# Patient Record
Sex: Female | Born: 1965 | Race: Black or African American | Hispanic: No | Smoking: Current every day smoker
Health system: Southern US, Community
[De-identification: ages and names within clinical notes are randomized; demographics above are authoritative.]

## PROBLEM LIST (undated history)

## (undated) DIAGNOSIS — I10 Essential (primary) hypertension: Secondary | ICD-10-CM

## (undated) DIAGNOSIS — M25512 Pain in left shoulder: Secondary | ICD-10-CM

## (undated) DIAGNOSIS — D219 Benign neoplasm of connective and other soft tissue, unspecified: Secondary | ICD-10-CM

## (undated) DIAGNOSIS — D649 Anemia, unspecified: Secondary | ICD-10-CM

## (undated) DIAGNOSIS — J45909 Unspecified asthma, uncomplicated: Secondary | ICD-10-CM

## (undated) DIAGNOSIS — T7840XA Allergy, unspecified, initial encounter: Secondary | ICD-10-CM

## (undated) DIAGNOSIS — I639 Cerebral infarction, unspecified: Secondary | ICD-10-CM

## (undated) DIAGNOSIS — B20 Human immunodeficiency virus [HIV] disease: Secondary | ICD-10-CM

## (undated) DIAGNOSIS — F329 Major depressive disorder, single episode, unspecified: Secondary | ICD-10-CM

## (undated) DIAGNOSIS — F32A Depression, unspecified: Secondary | ICD-10-CM

## (undated) DIAGNOSIS — F209 Schizophrenia, unspecified: Secondary | ICD-10-CM

## (undated) DIAGNOSIS — G8929 Other chronic pain: Secondary | ICD-10-CM

## (undated) DIAGNOSIS — F419 Anxiety disorder, unspecified: Secondary | ICD-10-CM

## (undated) HISTORY — PX: WISDOM TOOTH EXTRACTION: SHX21

## (undated) HISTORY — DX: Allergy, unspecified, initial encounter: T78.40XA

## (undated) HISTORY — DX: Human immunodeficiency virus (HIV) disease: B20

## (undated) HISTORY — DX: Benign neoplasm of connective and other soft tissue, unspecified: D21.9

## (undated) HISTORY — DX: Cerebral infarction, unspecified: I63.9

## (undated) HISTORY — PX: APPENDECTOMY: SHX54

---

## 1999-04-24 ENCOUNTER — Emergency Department (HOSPITAL_COMMUNITY): Admission: EM | Admit: 1999-04-24 | Discharge: 1999-04-24 | Payer: Self-pay | Admitting: Emergency Medicine

## 1999-04-25 ENCOUNTER — Encounter: Payer: Self-pay | Admitting: Otolaryngology

## 1999-04-25 ENCOUNTER — Inpatient Hospital Stay (HOSPITAL_COMMUNITY): Admission: RE | Admit: 1999-04-25 | Discharge: 1999-04-26 | Payer: Self-pay | Admitting: Otolaryngology

## 1999-09-09 ENCOUNTER — Emergency Department (HOSPITAL_COMMUNITY): Admission: EM | Admit: 1999-09-09 | Discharge: 1999-09-09 | Payer: Self-pay | Admitting: Emergency Medicine

## 1999-09-10 ENCOUNTER — Emergency Department (HOSPITAL_COMMUNITY): Admission: EM | Admit: 1999-09-10 | Discharge: 1999-09-10 | Payer: Self-pay | Admitting: Emergency Medicine

## 1999-09-10 ENCOUNTER — Encounter: Payer: Self-pay | Admitting: Emergency Medicine

## 2000-04-27 ENCOUNTER — Inpatient Hospital Stay (HOSPITAL_COMMUNITY): Admission: AD | Admit: 2000-04-27 | Discharge: 2000-04-27 | Payer: Self-pay | Admitting: *Deleted

## 2001-11-03 ENCOUNTER — Emergency Department (HOSPITAL_COMMUNITY): Admission: EM | Admit: 2001-11-03 | Discharge: 2001-11-04 | Payer: Self-pay | Admitting: Emergency Medicine

## 2001-11-03 ENCOUNTER — Emergency Department (HOSPITAL_COMMUNITY): Admission: EM | Admit: 2001-11-03 | Discharge: 2001-11-03 | Payer: Self-pay | Admitting: Emergency Medicine

## 2002-06-23 ENCOUNTER — Emergency Department (HOSPITAL_COMMUNITY): Admission: EM | Admit: 2002-06-23 | Discharge: 2002-06-23 | Payer: Self-pay | Admitting: *Deleted

## 2002-08-23 ENCOUNTER — Emergency Department (HOSPITAL_COMMUNITY): Admission: EM | Admit: 2002-08-23 | Discharge: 2002-08-23 | Payer: Self-pay

## 2002-11-20 ENCOUNTER — Encounter: Payer: Self-pay | Admitting: *Deleted

## 2002-11-20 ENCOUNTER — Emergency Department (HOSPITAL_COMMUNITY): Admission: EM | Admit: 2002-11-20 | Discharge: 2002-11-20 | Payer: Self-pay | Admitting: *Deleted

## 2003-10-31 ENCOUNTER — Emergency Department (HOSPITAL_COMMUNITY): Admission: EM | Admit: 2003-10-31 | Discharge: 2003-10-31 | Payer: Self-pay | Admitting: Emergency Medicine

## 2005-03-16 ENCOUNTER — Encounter: Payer: Self-pay | Admitting: Emergency Medicine

## 2005-03-16 ENCOUNTER — Emergency Department (HOSPITAL_COMMUNITY): Admission: EM | Admit: 2005-03-16 | Discharge: 2005-03-16 | Payer: Self-pay | Admitting: Emergency Medicine

## 2009-10-15 ENCOUNTER — Emergency Department (HOSPITAL_COMMUNITY): Admission: EM | Admit: 2009-10-15 | Discharge: 2009-10-15 | Payer: Self-pay | Admitting: Emergency Medicine

## 2009-10-30 ENCOUNTER — Emergency Department (HOSPITAL_COMMUNITY): Admission: EM | Admit: 2009-10-30 | Discharge: 2009-10-30 | Payer: Self-pay | Admitting: Emergency Medicine

## 2009-11-01 ENCOUNTER — Emergency Department (HOSPITAL_COMMUNITY): Admission: EM | Admit: 2009-11-01 | Discharge: 2009-11-01 | Payer: Self-pay | Admitting: Emergency Medicine

## 2009-11-13 ENCOUNTER — Emergency Department (HOSPITAL_COMMUNITY): Admission: EM | Admit: 2009-11-13 | Discharge: 2009-11-13 | Payer: Self-pay | Admitting: Emergency Medicine

## 2011-01-21 ENCOUNTER — Emergency Department (HOSPITAL_COMMUNITY)
Admission: EM | Admit: 2011-01-21 | Discharge: 2011-01-21 | Disposition: A | Discharge status: Home / Self Care | Payer: Self-pay | Attending: Emergency Medicine | Admitting: Emergency Medicine

## 2011-01-21 DIAGNOSIS — J029 Acute pharyngitis, unspecified: Secondary | ICD-10-CM | POA: Insufficient documentation

## 2011-03-21 LAB — RAPID STREP SCREEN (MED CTR MEBANE ONLY): Streptococcus, Group A Screen (Direct): NEGATIVE

## 2011-03-21 LAB — CBC
HCT: 33.8 % — ABNORMAL LOW (ref 36.0–46.0)
Hemoglobin: 11 g/dL — ABNORMAL LOW (ref 12.0–15.0)
MCHC: 32.6 g/dL (ref 30.0–36.0)
MCV: 76.1 fL — ABNORMAL LOW (ref 78.0–100.0)
Platelets: 283 10*3/uL (ref 150–400)
RBC: 4.44 MIL/uL (ref 3.87–5.11)
RDW: 19.5 % — ABNORMAL HIGH (ref 11.5–15.5)
WBC: 5.8 K/uL (ref 4.0–10.5)

## 2011-03-21 LAB — URINALYSIS, ROUTINE W REFLEX MICROSCOPIC
Bilirubin Urine: NEGATIVE
Glucose, UA: NEGATIVE mg/dL
Ketones, ur: NEGATIVE mg/dL
Protein, ur: 30 mg/dL — AB
Specific Gravity, Urine: 1.016 (ref 1.005–1.030)
Urobilinogen, UA: 1 mg/dL (ref 0.0–1.0)
pH: 7 (ref 5.0–8.0)

## 2011-03-21 LAB — COMPREHENSIVE METABOLIC PANEL WITH GFR
Alkaline Phosphatase: 82 U/L (ref 39–117)
BUN: 5 mg/dL — ABNORMAL LOW (ref 6–23)
CO2: 26 meq/L (ref 19–32)
Chloride: 88 meq/L — ABNORMAL LOW (ref 96–112)
Creatinine, Ser: 0.78 mg/dL (ref 0.4–1.2)
GFR calc non Af Amer: >60 mL/min (ref >60)
Glucose, Bld: 107 mg/dL — ABNORMAL HIGH (ref 70–99)
Potassium: 3.4 meq/L — ABNORMAL LOW (ref 3.5–5.1)
Total Bilirubin: 0.4 mg/dL (ref 0.3–1.2)

## 2011-03-21 LAB — DIFFERENTIAL
Basophils Absolute: 0 K/uL (ref 0.0–0.1)
Basophils Relative: 1 % (ref 0–1)
Eosinophils Absolute: 0 10*3/uL (ref 0.0–0.7)
Eosinophils Relative: 0 % (ref 0–5)
Lymphocytes Relative: 22 % (ref 12–46)
Lymphs Abs: 1.3 10*3/uL (ref 0.7–4.0)
Monocytes Absolute: 0.6 K/uL (ref 0.1–1.0)
Monocytes Relative: 10 % (ref 3–12)
Neutro Abs: 3.9 K/uL (ref 1.7–7.7)
Neutrophils Relative %: 68 % (ref 43–77)

## 2011-03-21 LAB — URINE MICROSCOPIC-ADD ON

## 2011-03-21 LAB — COMPREHENSIVE METABOLIC PANEL
ALT: 49 U/L — ABNORMAL HIGH (ref 0–35)
AST: 62 U/L — ABNORMAL HIGH (ref 0–37)
Albumin: 3.1 g/dL — ABNORMAL LOW (ref 3.5–5.2)
Calcium: 7.8 mg/dL — ABNORMAL LOW (ref 8.4–10.5)
GFR calc Af Amer: >60 mL/min (ref >60)
Sodium: 125 mEq/L — ABNORMAL LOW (ref 135–145)
Total Protein: 7.1 g/dL (ref 6.0–8.3)

## 2011-03-21 LAB — STREP A DNA PROBE: Group A Strep Probe: NEGATIVE

## 2011-03-21 LAB — MONONUCLEOSIS SCREEN: Mono Screen: POSITIVE — AB

## 2011-06-21 ENCOUNTER — Emergency Department (HOSPITAL_COMMUNITY): Payer: Self-pay

## 2011-06-21 ENCOUNTER — Emergency Department (HOSPITAL_COMMUNITY)
Admission: EM | Admit: 2011-06-21 | Discharge: 2011-06-21 | Disposition: A | Discharge status: Home / Self Care | Payer: Self-pay | Attending: Emergency Medicine | Admitting: Emergency Medicine

## 2011-06-21 DIAGNOSIS — R296 Repeated falls: Secondary | ICD-10-CM | POA: Insufficient documentation

## 2011-06-21 DIAGNOSIS — S43016A Anterior dislocation of unspecified humerus, initial encounter: Secondary | ICD-10-CM | POA: Insufficient documentation

## 2011-06-21 DIAGNOSIS — M25519 Pain in unspecified shoulder: Secondary | ICD-10-CM | POA: Insufficient documentation

## 2011-10-27 ENCOUNTER — Encounter: Payer: Self-pay | Admitting: *Deleted

## 2011-10-27 ENCOUNTER — Emergency Department (INDEPENDENT_AMBULATORY_CARE_PROVIDER_SITE_OTHER)
Admission: EM | Admit: 2011-10-27 | Discharge: 2011-10-27 | Disposition: A | Discharge status: Home / Self Care | Payer: Self-pay | Source: Home / Self Care | Attending: Emergency Medicine | Admitting: Emergency Medicine

## 2011-10-27 DIAGNOSIS — R591 Generalized enlarged lymph nodes: Secondary | ICD-10-CM

## 2011-10-27 DIAGNOSIS — H9209 Otalgia, unspecified ear: Secondary | ICD-10-CM

## 2011-10-27 DIAGNOSIS — H9201 Otalgia, right ear: Secondary | ICD-10-CM

## 2011-10-27 DIAGNOSIS — R599 Enlarged lymph nodes, unspecified: Secondary | ICD-10-CM

## 2011-10-27 MED ORDER — NEOMYCIN-POLYMYXIN-HC 1 % OT SOLN: 3.0000 [drp] | Freq: Four times a day (QID) | OTIC | Status: AC

## 2011-10-27 NOTE — ED Notes (Signed)
C/O right earache, left ear "tingling", throat irritation since yesterday.  Pt believes she may have piece of cotton from Q-tip lodged in ear - no FB noted.

## 2011-10-27 NOTE — ED Provider Notes (Signed)
History     CSN: [id number] Arrival date & time: 10/27/2011  6:34 PM   First MD Initiated Contact with Patient 10/27/11 1903      Chief Complaint  Patient presents with  . Otalgia    Throat Irritation    (Consider location/radiation/quality/duration/timing/severity/associated sxs/prior treatment) HPI  History reviewed. No pertinent past medical history.  Past Surgical History  Procedure Date  . Appendectomy     No family history on file.  History  Substance Use Topics  . Smoking status: Not on file  . Smokeless tobacco: Not on file  . Alcohol Use: No    OB History    Grav Para Term Preterm Abortions TAB SAB Ect Mult Living                  Review of Systems  Allergies  Penicillins  Home Medications  No current outpatient prescriptions on file.  BP 152/99  Pulse 80  Temp(Src) 98.4 F (36.9 C) (Oral)  Resp 12  SpO2 99%  LMP 10/25/2011  Physical Exam  ED Course  Procedures (including critical care time)  Labs Reviewed - No data to display No results found.   No diagnosis found.    MDM  Left ear pain NO FB with sentinel or reactive LAD        Jimmie Molly, MD 10/27/11 1910

## 2011-12-18 DIAGNOSIS — G8929 Other chronic pain: Secondary | ICD-10-CM

## 2011-12-18 HISTORY — DX: Other chronic pain: G89.29

## 2012-12-29 ENCOUNTER — Encounter (HOSPITAL_COMMUNITY): Payer: Self-pay | Admitting: Emergency Medicine

## 2012-12-29 ENCOUNTER — Emergency Department (HOSPITAL_COMMUNITY): Payer: Self-pay

## 2012-12-29 ENCOUNTER — Emergency Department (HOSPITAL_COMMUNITY)
Admission: EM | Admit: 2012-12-29 | Discharge: 2012-12-30 | Disposition: A | Discharge status: Home / Self Care | Payer: Self-pay | Attending: Emergency Medicine | Admitting: Emergency Medicine

## 2012-12-29 DIAGNOSIS — R42 Dizziness and giddiness: Secondary | ICD-10-CM | POA: Insufficient documentation

## 2012-12-29 DIAGNOSIS — B9789 Other viral agents as the cause of diseases classified elsewhere: Secondary | ICD-10-CM | POA: Insufficient documentation

## 2012-12-29 DIAGNOSIS — N949 Unspecified condition associated with female genital organs and menstrual cycle: Secondary | ICD-10-CM | POA: Insufficient documentation

## 2012-12-29 DIAGNOSIS — N938 Other specified abnormal uterine and vaginal bleeding: Secondary | ICD-10-CM | POA: Insufficient documentation

## 2012-12-29 DIAGNOSIS — R509 Fever, unspecified: Secondary | ICD-10-CM | POA: Insufficient documentation

## 2012-12-29 DIAGNOSIS — D259 Leiomyoma of uterus, unspecified: Secondary | ICD-10-CM | POA: Insufficient documentation

## 2012-12-29 DIAGNOSIS — R109 Unspecified abdominal pain: Secondary | ICD-10-CM | POA: Insufficient documentation

## 2012-12-29 DIAGNOSIS — D219 Benign neoplasm of connective and other soft tissue, unspecified: Secondary | ICD-10-CM

## 2012-12-29 DIAGNOSIS — B349 Viral infection, unspecified: Secondary | ICD-10-CM

## 2012-12-29 DIAGNOSIS — N939 Abnormal uterine and vaginal bleeding, unspecified: Secondary | ICD-10-CM

## 2012-12-29 LAB — URINALYSIS, ROUTINE W REFLEX MICROSCOPIC
Bilirubin Urine: NEGATIVE
Specific Gravity, Urine: 1.016 (ref 1.005–1.030)
Urobilinogen, UA: 0.2 mg/dL (ref 0.0–1.0)
pH: 6 (ref 5.0–8.0)

## 2012-12-29 LAB — CBC WITH DIFFERENTIAL/PLATELET
Basophils Absolute: 0 10*3/uL (ref 0.0–0.1)
Basophils Relative: 0 % (ref 0–1)
Hemoglobin: 8.2 g/dL — ABNORMAL LOW (ref 12.0–15.0)
Lymphocytes Relative: 57 % — ABNORMAL HIGH (ref 12–46)
Lymphs Abs: 3.3 10*3/uL (ref 0.7–4.0)
MCHC: 30.4 g/dL (ref 30.0–36.0)
Myelocytes: 0 %
Neutro Abs: 2.2 10*3/uL (ref 1.7–7.7)
Neutrophils Relative %: 38 % — ABNORMAL LOW (ref 43–77)
Platelets: 349 10*3/uL (ref 150–400)
Promyelocytes Absolute: 0 %
RBC: 3.82 MIL/uL — ABNORMAL LOW (ref 3.87–5.11)
nRBC: 0 /100 WBC

## 2012-12-29 LAB — URINE MICROSCOPIC-ADD ON

## 2012-12-29 LAB — COMPREHENSIVE METABOLIC PANEL
Albumin: 3.3 g/dL — ABNORMAL LOW (ref 3.5–5.2)
Alkaline Phosphatase: 93 U/L (ref 39–117)
BUN: 14 mg/dL (ref 6–23)
CO2: 23 mEq/L (ref 19–32)
Chloride: 101 mEq/L (ref 96–112)
Creatinine, Ser: 0.81 mg/dL (ref 0.50–1.10)
GFR calc Af Amer: >90 mL/min (ref >90)
GFR calc non Af Amer: 86 mL/min — ABNORMAL LOW (ref >90)
Glucose, Bld: 91 mg/dL (ref 70–99)
Potassium: 3.8 mEq/L (ref 3.5–5.1)
Total Bilirubin: 0.2 mg/dL — ABNORMAL LOW (ref 0.3–1.2)

## 2012-12-29 LAB — PREGNANCY, URINE: Preg Test, Ur: NEGATIVE

## 2012-12-29 LAB — PROTIME-INR
INR: 1.07 (ref 0.00–1.49)
Prothrombin Time: 13.8 seconds (ref 11.6–15.2)

## 2012-12-29 MED ORDER — MEDROXYPROGESTERONE ACETATE 5 MG PO TABS: 20.0000 mg | ORAL_TABLET | Freq: Every day | ORAL | Status: DC

## 2012-12-29 MED ORDER — SODIUM CHLORIDE 0.9 % IV BOLUS (SEPSIS)
1000.0000 mL | Freq: Once | INTRAVENOUS | Status: AC
  Administered 2012-12-29: 1000 mL via INTRAVENOUS

## 2012-12-29 NOTE — ED Notes (Signed)
Pt reports cough, sore throat and fever since Friday. Denies chills or body aches. Fever at home 101. Also report that she has vaginal bleeding x1 month due fibroids and says "I feel weak from all this bleeding."

## 2012-12-29 NOTE — ED Notes (Signed)
MD at bedside. Performing pelvic exam

## 2012-12-29 NOTE — ED Notes (Signed)
Urine recollected 

## 2012-12-29 NOTE — ED Provider Notes (Addendum)
History     CSN: [id number]  Arrival date & time 12/29/12  1600   First MD Initiated Contact with Patient 12/29/12 1927      Chief Complaint  Patient presents with  . Vaginal Bleeding  . flu like symptoms     (Consider location/radiation/quality/duration/timing/severity/associated sxs/prior treatment) The history is provided by the patient.  Katie Doyle is a 47 y.o. female history of fibroids here with vaginal bleeding. Vaginal bleeding intermittent for the whole month. Since yesterday she said that she bleed about 20 pads. She feels lightheaded and dizzy. As diffuse crampy pain from the fibroids. No vomiting. For the last 5 days she's been having some cough and sore throat and subjective fevers. She thought she had a viral infection.   History reviewed. No pertinent past medical history.  Past Surgical History  Procedure Date  . Appendectomy     No family history on file.  History  Substance Use Topics  . Smoking status: Not on file  . Smokeless tobacco: Not on file  . Alcohol Use: No    OB History    Grav Para Term Preterm Abortions TAB SAB Ect Mult Living                  Review of Systems  Constitutional: Positive for fever.  Gastrointestinal: Positive for abdominal pain.  Genitourinary: Positive for vaginal bleeding.  All other systems reviewed and are negative.    Allergies  Penicillins  Home Medications   Current Outpatient Rx  Name  Route  Sig  Dispense  Refill  . MUCINEX DM PO   Oral   Take 20 mLs by mouth 2 (two) times daily as needed. For cough.           BP 168/97  Pulse 80  Temp 98.7 F (37.1 C)  Resp 20  SpO2 100%  Physical Exam  Nursing note and vitals reviewed. Constitutional: She is oriented to person, place, and time. She appears well-developed.       Uncomfortable   HENT:  Head: Normocephalic.  Mouth/Throat: Oropharynx is clear and moist.  Eyes: Pupils are equal, round, and reactive to light.       Conjunctiva  slightly pale   Neck: Normal range of motion. Neck supple.  Cardiovascular: Normal rate, regular rhythm and normal heart sounds.   Pulmonary/Chest: Effort normal and breath sounds normal. No respiratory distress. She has no wheezes. She has no rales.  Abdominal:       Firm, large fibroids up to epigastric area, + tender fibroids.   Genitourinary:       Minimal vag bleeding. No active bleeding. + uterine tenderness from large fibroids. Unable to feel adnexa.   Musculoskeletal: Normal range of motion.  Neurological: She is alert and oriented to person, place, and time.  Skin: Skin is warm and dry.  Psychiatric: She has a normal mood and affect. Her behavior is normal. Judgment and thought content normal.    ED Course  Procedures (including critical care time)  Labs Reviewed  CBC WITH DIFFERENTIAL - Abnormal; Notable for the following:    RBC 3.82 (*)     Hemoglobin 8.2 (*)     HCT 27.0 (*)     MCV 70.7 (*)     MCH 21.5 (*)     RDW 18.1 (*)     Neutrophils Relative 38 (*)     Lymphocytes Relative 57 (*)     Monocytes Relative 2 (*)     All  other components within normal limits  COMPREHENSIVE METABOLIC PANEL - Abnormal; Notable for the following:    Albumin 3.3 (*)     Total Bilirubin 0.2 (*)     GFR calc non Af Amer 86 (*)     All other components within normal limits  URINALYSIS, ROUTINE W REFLEX MICROSCOPIC - Abnormal; Notable for the following:    Hgb urine dipstick LARGE (*)     All other components within normal limits  URINE MICROSCOPIC-ADD ON - Abnormal; Notable for the following:    Squamous Epithelial / LPF FEW (*)     All other components within normal limits  PROTIME-INR  URINALYSIS, ROUTINE W REFLEX MICROSCOPIC  PREGNANCY, URINE  PREGNANCY, URINE   US Transvaginal Non-ob  12/29/2012  *RADIOLOGY REPORT*  Clinical Data: D U B.  History of fibroids.  Concern for degenerating fibroid versus ovarian torsion.  TRANSABDOMINAL AND TRANSVAGINAL ULTRASOUND OF PELVIS  Technique:  Both transabdominal and transvaginal ultrasound examinations of the pelvis were performed. Transabdominal technique was performed for global imaging of the pelvis including uterus, ovaries, adnexal regions, and pelvic cul-de-sac.  It was necessary to proceed with endovaginal exam following the transabdominal exam to visualize the ovaries and endometrium.  Comparison:  None  Findings:  Uterus: There is diffuse multinodular enlargement of the uterus. Uterus measures 24 x 12.8 x 21.3 cm.  Multiple uterine masses are demonstrated consistent with fibroids, including submucosal, subserosal, and exophytic fibroids.  The largest lesions represent an exophytic fibroid arising from the dome of the uterus measuring 8.1 x 7.3 x 7.7 cm.  A right posterior fibroid measures 8.6 x 8.2 x 9 cm.  A left anterior fibroid measures 6.1 x 5.6 x 7 cm and contains calcification.  Right mid submucosal fibroid measures 9.1 x 9.7 x 10.5 cm. No definite cystic degeneration is demonstrated.  Endometrium: The endometrium is not visualized due to displacement by the multiple fibroids.  Right ovary:  Right ovary is not visualized.  No abnormal masses are demonstrated in the visualized adnexal region.  Left ovary: Left ovary is not visualized.  No abnormal masses are demonstrated in the visualized adnexal region.  Other findings: No free fluid  IMPRESSION: Multiple uterine fibroids resulting in diffuse nodular enlargement of the uterus.  The ovaries and endometrium are not visualized.   Original Report Authenticated By: Burman Nieves, M.D.    US Pelvis Complete  12/29/2012  *RADIOLOGY REPORT*  Clinical Data: D U B.  History of fibroids.  Concern for degenerating fibroid versus ovarian torsion.  TRANSABDOMINAL AND TRANSVAGINAL ULTRASOUND OF PELVIS Technique:  Both transabdominal and transvaginal ultrasound examinations of the pelvis were performed. Transabdominal technique was performed for global imaging of the pelvis including  uterus, ovaries, adnexal regions, and pelvic cul-de-sac.  It was necessary to proceed with endovaginal exam following the transabdominal exam to visualize the ovaries and endometrium.  Comparison:  None  Findings:  Uterus: There is diffuse multinodular enlargement of the uterus. Uterus measures 24 x 12.8 x 21.3 cm.  Multiple uterine masses are demonstrated consistent with fibroids, including submucosal, subserosal, and exophytic fibroids.  The largest lesions represent an exophytic fibroid arising from the dome of the uterus measuring 8.1 x 7.3 x 7.7 cm.  A right posterior fibroid measures 8.6 x 8.2 x 9 cm.  A left anterior fibroid measures 6.1 x 5.6 x 7 cm and contains calcification.  Right mid submucosal fibroid measures 9.1 x 9.7 x 10.5 cm. No definite cystic degeneration is demonstrated.  Endometrium: The  endometrium is not visualized due to displacement by the multiple fibroids.  Right ovary:  Right ovary is not visualized.  No abnormal masses are demonstrated in the visualized adnexal region.  Left ovary: Left ovary is not visualized.  No abnormal masses are demonstrated in the visualized adnexal region.  Other findings: No free fluid  IMPRESSION: Multiple uterine fibroids resulting in diffuse nodular enlargement of the uterus.  The ovaries and endometrium are not visualized.   Original Report Authenticated By: Burman Nieves, M.D.      No diagnosis found.    MDM  Katie Doyle is a 47 y.o. female here with ab pain, vag bleeding. Hg 8.2, no baseline. Will do US transvag to r/o degenerating fibroids given abdominal tenderness. Patient not orthostatic.   11:30 PM US showed multiple fibroids but no degenerative fibroids. Patient comfortable. I called on call OB doctor, who recommend starting Provera 20mg  daily and will call her to follow up at [facility] clinic. Return precautions given.        Richardean Canal, MD 12/29/12 2330  Richardean Canal, MD 12/29/12 [phone]

## 2012-12-29 NOTE — ED Notes (Signed)
Pelvic cart ready. 

## 2012-12-29 NOTE — ED Notes (Signed)
US at bedside

## 2013-01-14 ENCOUNTER — Encounter: Payer: Self-pay | Admitting: Obstetrics & Gynecology

## 2013-01-19 ENCOUNTER — Ambulatory Visit (INDEPENDENT_AMBULATORY_CARE_PROVIDER_SITE_OTHER): Payer: Self-pay | Admitting: Obstetrics & Gynecology

## 2013-01-19 ENCOUNTER — Other Ambulatory Visit (HOSPITAL_COMMUNITY)
Admission: RE | Admit: 2013-01-19 | Discharge: 2013-01-19 | Disposition: A | Discharge status: Home / Self Care | Payer: Self-pay | Source: Ambulatory Visit | Attending: Obstetrics & Gynecology | Admitting: Obstetrics & Gynecology

## 2013-01-19 ENCOUNTER — Encounter: Payer: Self-pay | Admitting: Obstetrics & Gynecology

## 2013-01-19 VITALS — BP 162/91 | HR 75 | Ht 65.5 in | Wt 219.9 lb

## 2013-01-19 DIAGNOSIS — D259 Leiomyoma of uterus, unspecified: Secondary | ICD-10-CM | POA: Insufficient documentation

## 2013-01-19 DIAGNOSIS — D649 Anemia, unspecified: Secondary | ICD-10-CM | POA: Insufficient documentation

## 2013-01-19 DIAGNOSIS — N920 Excessive and frequent menstruation with regular cycle: Secondary | ICD-10-CM | POA: Insufficient documentation

## 2013-01-19 MED ORDER — MEDROXYPROGESTERONE ACETATE 10 MG PO TABS: 10.0000 mg | ORAL_TABLET | Freq: Every day | ORAL | Status: DC

## 2013-01-19 NOTE — Progress Notes (Signed)
Patient ID: Katie Doyle, female   DOB: Nov 16, 1966, 47 y.o.   MRN: [id number]  Chief Complaint  Patient presents with  . Menorrhagia    on Provera spotting now. Before medicine she was bleeding for about 3 months straight.   . Fibroids    HPI Katie Doyle is a 47 y.o. female.  Long h/o fibroid uterus and increased menrrhagia, seen in [facility] ED.Less bleeding on Provera currently HPI  Past Medical History  Diagnosis Date  . Fibroids   . Allergy     Past Surgical History  Procedure Date  . Appendectomy   . Cesarean section     History reviewed. No pertinent family history.  Social History History  Substance Use Topics  . Smoking status: Light Tobacco Smoker -- 0.2 packs/day    Types: Cigarettes  . Smokeless tobacco: Never Used  . Alcohol Use: No    Allergies  Allergen Reactions  . Penicillins Itching    Current Outpatient Prescriptions  Medication Sig Dispense Refill  . medroxyPROGESTERone (PROVERA) 10 MG tablet Take 1 tablet (10 mg total) by mouth daily.  30 tablet  2  . Dextromethorphan-Guaifenesin (MUCINEX DM PO) Take 20 mLs by mouth 2 (two) times daily as needed. For cough.        Review of Systems Review of Systems  Gastrointestinal: Positive for abdominal pain and abdominal distention. Negative for nausea and vomiting.  Genitourinary: Positive for urgency, frequency and vaginal bleeding. Negative for vaginal discharge and vaginal pain.    Blood pressure 162/91, pulse 75, height 5' 5.5" (1.664 m), weight 219 lb 14.4 oz (99.746 kg), last menstrual period 01/18/2013.  Physical Exam Physical Exam  Constitutional: She appears well-developed and well-nourished. No distress.  Pulmonary/Chest: Effort normal. No respiratory distress.  Abdominal: She exhibits distension and mass (firm mass to RUQ c/w large fibroid uterus). There is no tenderness.  Genitourinary:       Dark vaginal bleeding, cervix no lesions pap done, cervix prepped and EMB done after counseling and  time out, sounds > 12 cm, dark bloody specimen  Skin: Skin is warm.  Psychiatric: She has a normal mood and affect. Her behavior is normal.    Data Reviewed *RADIOLOGY REPORT*  Clinical Data: D U B. History of fibroids. Concern for  degenerating fibroid versus ovarian torsion.  TRANSABDOMINAL AND TRANSVAGINAL ULTRASOUND OF PELVIS  Technique: Both transabdominal and transvaginal ultrasound  examinations of the pelvis were performed. Transabdominal technique  was performed for global imaging of the pelvis including uterus,  ovaries, adnexal regions, and pelvic cul-de-sac.  It was necessary to proceed with endovaginal exam following the  transabdominal exam to visualize the ovaries and endometrium.  Comparison: None  Findings:  Uterus: There is diffuse multinodular enlargement of the uterus.  Uterus measures 24 x 12.8 x 21.3 cm. Multiple uterine masses are  demonstrated consistent with fibroids, including submucosal,  subserosal, and exophytic fibroids.  The largest lesions represent an exophytic fibroid arising from the  dome of the uterus measuring 8.1 x 7.3 x 7.7 cm. A right posterior  fibroid measures 8.6 x 8.2 x 9 cm. A left anterior fibroid  measures 6.1 x 5.6 x 7 cm and contains calcification. Right mid  submucosal fibroid measures 9.1 x 9.7 x 10.5 cm. No definite cystic  degeneration is demonstrated.  Endometrium: The endometrium is not visualized due to displacement  by the multiple fibroids.  Right ovary: Right ovary is not visualized. No abnormal masses  are demonstrated in the visualized adnexal region.  Left ovary: Left ovary is not visualized. No abnormal masses are  demonstrated in the visualized adnexal region.  Other findings: No free fluid  IMPRESSION:  Multiple uterine fibroids resulting in diffuse nodular enlargement  of the uterus. The ovaries and endometrium are not visualized.  Original Report Authenticated By: Burman Nieves, M.D. CBC    Component Value  Date/Time   WBC 5.8 12/29/2012 1927   RBC 3.82* 12/29/2012 1927   HGB 8.2* 12/29/2012 1927   HCT 27.0* 12/29/2012 1927   PLT 349 12/29/2012 1927   MCV 70.7* 12/29/2012 1927   MCH 21.5* 12/29/2012 1927   MCHC 30.4 12/29/2012 1927   RDW 18.1* 12/29/2012 1927   LYMPHSABS 3.3 12/29/2012 1927   MONOABS 0.1 12/29/2012 1927   EOSABS 0.2 12/29/2012 1927   BASOSABS 0.0 12/29/2012 1927      Assessment    Menorrhagia large fibroids and anemia    Plan    Needs TAH, procedure and risk s discussed, apply for financial assistance and order Lupron Depot 11.25 mg asap, continue Provera, RTC 4 weeks, review Bx       Christabel Camire 01/19/2013, 3:04 PM

## 2013-01-19 NOTE — Patient Instructions (Addendum)
Hysterectomy Information   A hysterectomy is a procedure where your uterus is surgically removed. It will no longer be possible to have menstrual periods or to become pregnant. The tubes and ovaries can be removed (bilateral salpingo-oopherectomy) during this surgery as well.    REASONS FOR A HYSTERECTOMY  · Persistent, abnormal bleeding.  · Lasting (chronic) pelvic pain or infection.  · The lining of the uterus (endometrium) starts growing outside the uterus (endometriosis).  · The endometrium starts growing in the muscle of the uterus (adenomyosis).  · The uterus falls down into the vagina (pelvic organ prolapse).  · Symptomatic uterine fibroids.  · Precancerous cells.  · Cervical cancer or uterine cancer.  TYPES OF HYSTERECTOMIES  · Supracervical hysterectomy. This type removes the top part of the uterus, but not the cervix.  · Total hysterectomy. This type removes the uterus and cervix.  · Radical hysterectomy. This type removes the uterus, cervix, and the fibrous tissue that holds the uterus in place in the pelvis (parametrium).  WAYS A HYSTERECTOMY CAN BE PERFORMED  · Abdominal hysterectomy. A large surgical cut (incision) is made in the abdomen. The uterus is removed through this incision.  · Vaginal hysterectomy. An incision is made in the vagina. The uterus is removed through this incision. There are no abdominal incisions.  · Conventional laparoscopic hysterectomy. A thin, lighted tube with a camera (laparoscope) is inserted into 3 or 4 small incisions in the abdomen. The uterus is cut into small pieces. The small pieces are removed through the incisions, or they are removed through the vagina.  · Laparoscopic assisted vaginal hysterectomy (LAVH). Three or four small incisions are made in the abdomen. Part of the surgery is performed laparoscopically and part vaginally. The uterus is removed through the vagina.  · Robot-assisted laparoscopic hysterectomy. A laparoscope is inserted into 3 or 4 small  incisions in the abdomen. A computer-controlled device is used to give the surgeon a 3D image. This allows for more precise movements of surgical instruments. The uterus is cut into small pieces and removed through the incisions or removed through the vagina.  RISKS OF HYSTERECTOMY    · Bleeding and risk of blood transfusion. Tell your caregiver if you do not want to receive any blood products.  · Blood clots in the legs or lung.  · Infection.  · Injury to surrounding organs.  · Anesthesia problems or side effects.  · Conversion to an abdominal hysterectomy.  WHAT TO EXPECT AFTER A HYSTERECTOMY  · You will be given pain medicine.  · You will need to have someone with you for the first 3 to 5 days after you go home.  · You will need to follow up with your surgeon in 2 to 4 weeks after surgery to evaluate your progress.  · You may have early menopause symptoms like hot flashes, night sweats, and insomnia.  · If you had a hysterectomy for a problem that was not a cancer or a condition that could lead to cancer, then you no longer need Pap tests. However, even if you no longer need a Pap test, a regular exam is a good idea to make sure no other problems are starting.  Document Released: 05/29/2001 Document Revised: 02/25/2012 Document Reviewed: 07/14/2011  [employer]® Patient Information ©2013 [employer].

## 2013-01-26 DIAGNOSIS — Z3202 Encounter for pregnancy test, result negative: Secondary | ICD-10-CM | POA: Insufficient documentation

## 2013-01-26 DIAGNOSIS — D259 Leiomyoma of uterus, unspecified: Secondary | ICD-10-CM | POA: Insufficient documentation

## 2013-01-26 DIAGNOSIS — Z79899 Other long term (current) drug therapy: Secondary | ICD-10-CM | POA: Insufficient documentation

## 2013-01-26 DIAGNOSIS — F17200 Nicotine dependence, unspecified, uncomplicated: Secondary | ICD-10-CM | POA: Insufficient documentation

## 2013-01-26 DIAGNOSIS — D649 Anemia, unspecified: Secondary | ICD-10-CM | POA: Insufficient documentation

## 2013-01-26 DIAGNOSIS — E871 Hypo-osmolality and hyponatremia: Secondary | ICD-10-CM | POA: Insufficient documentation

## 2013-01-27 ENCOUNTER — Encounter (HOSPITAL_COMMUNITY): Payer: Self-pay | Admitting: Emergency Medicine

## 2013-01-27 ENCOUNTER — Emergency Department (HOSPITAL_COMMUNITY)
Admission: EM | Admit: 2013-01-27 | Discharge: 2013-01-27 | Disposition: A | Discharge status: Home / Self Care | Payer: Self-pay | Attending: Emergency Medicine | Admitting: Emergency Medicine

## 2013-01-27 DIAGNOSIS — D259 Leiomyoma of uterus, unspecified: Secondary | ICD-10-CM

## 2013-01-27 DIAGNOSIS — E871 Hypo-osmolality and hyponatremia: Secondary | ICD-10-CM

## 2013-01-27 DIAGNOSIS — D649 Anemia, unspecified: Secondary | ICD-10-CM

## 2013-01-27 LAB — CBC WITH DIFFERENTIAL/PLATELET
Basophils Absolute: 0 10*3/uL (ref 0.0–0.1)
Eosinophils Absolute: 0.1 10*3/uL (ref 0.0–0.7)
Eosinophils Relative: 1 % (ref 0–5)
Lymphocytes Relative: 15 % (ref 12–46)
MCH: 21.5 pg — ABNORMAL LOW (ref 26.0–34.0)
MCV: 69.3 fL — ABNORMAL LOW (ref 78.0–100.0)
Neutrophils Relative %: 75 % (ref 43–77)
Platelets: 374 10*3/uL (ref 150–400)
RDW: 18 % — ABNORMAL HIGH (ref 11.5–15.5)
WBC: 10.3 10*3/uL (ref 4.0–10.5)

## 2013-01-27 LAB — COMPREHENSIVE METABOLIC PANEL
ALT: 9 U/L (ref 0–35)
AST: 25 U/L (ref 0–37)
Calcium: 8.9 mg/dL (ref 8.4–10.5)
Potassium: 3.5 mEq/L (ref 3.5–5.1)
Sodium: 128 mEq/L — ABNORMAL LOW (ref 135–145)
Total Protein: 8.6 g/dL — ABNORMAL HIGH (ref 6.0–8.3)

## 2013-01-27 LAB — URINALYSIS, MICROSCOPIC ONLY
Glucose, UA: NEGATIVE mg/dL
Specific Gravity, Urine: 1.031 — ABNORMAL HIGH (ref 1.005–1.030)
pH: 5.5 (ref 5.0–8.0)

## 2013-01-27 LAB — POCT PREGNANCY, URINE: Preg Test, Ur: NEGATIVE

## 2013-01-27 MED ORDER — OXYCODONE-ACETAMINOPHEN 5-325 MG PO TABS: 1.0000 | ORAL_TABLET | Freq: Three times a day (TID) | ORAL | Status: DC | PRN

## 2013-01-27 MED ORDER — OXYCODONE-ACETAMINOPHEN 5-325 MG PO TABS
1.0000 | ORAL_TABLET | Freq: Once | ORAL | Status: AC
  Administered 2013-01-27: 1 via ORAL
  Filled 2013-01-27: qty 1

## 2013-01-27 MED ORDER — SODIUM CHLORIDE 0.9 % IV BOLUS (SEPSIS): 1000.0000 mL | Freq: Once | INTRAVENOUS | Status: DC

## 2013-01-27 MED ORDER — SODIUM CHLORIDE 0.9 % IV SOLN
Freq: Once | INTRAVENOUS | Status: AC
  Administered 2013-01-27: 04:00:00 via INTRAVENOUS

## 2013-01-27 NOTE — ED Provider Notes (Signed)
Medical screening examination/treatment/procedure(s) were performed by non-physician practitioner and as supervising physician I was immediately available for consultation/collaboration.  Sunnie Nielsen, MD 01/27/13 [phone]

## 2013-01-27 NOTE — ED Notes (Signed)
Pt c/o abd pain for 4 days,N/V/D, fever and chills.

## 2013-01-27 NOTE — ED Provider Notes (Signed)
Medical screening examination/treatment/procedure(s) were performed by non-physician practitioner and as supervising physician I was immediately available for consultation/collaboration.  Sunnie Nielsen, MD 01/27/13 [phone]

## 2013-01-27 NOTE — ED Provider Notes (Addendum)
History     CSN: [id number]  Arrival date & time 01/26/13  2327   First MD Initiated Contact with Patient 01/27/13 0105      Chief Complaint  Patient presents with  . Abdominal Pain    (Consider location/radiation/quality/duration/timing/severity/associated sxs/prior treatment) HPI Comments: History or fibroids with intermittent episodes of bleeding  Has been seen by PCP/OB and is waiting for financial assistance for the surgery   The history is provided by the patient.    Past Medical History  Diagnosis Date  . Fibroids   . Allergy     Past Surgical History  Procedure Laterality Date  . Appendectomy    . Cesarean section      No family history on file.  History  Substance Use Topics  . Smoking status: Light Tobacco Smoker -- 0.25 packs/day    Types: Cigarettes  . Smokeless tobacco: Never Used  . Alcohol Use: No    OB History   Grav Para Term Preterm Abortions TAB SAB Ect Mult Living   3 2 2  1 1    2       Review of Systems  Constitutional: Negative for fever and chills.  Gastrointestinal: Negative for abdominal pain.  Genitourinary: Negative for vaginal discharge and vaginal pain.  Neurological: Negative for weakness and numbness.    Allergies  Penicillins  Home Medications   Current Outpatient Rx  Name  Route  Sig  Dispense  Refill  . acetaminophen (TYLENOL) 500 MG tablet   Oral   Take 1,000 mg by mouth every 6 (six) hours as needed for pain.         Marland Kitchen ibuprofen (ADVIL,MOTRIN) 200 MG tablet   Oral   Take 400 mg by mouth every 6 (six) hours as needed for pain.         . medroxyPROGESTERone (PROVERA) 10 MG tablet   Oral   Take 1 tablet (10 mg total) by mouth daily.   30 tablet   2   . oxyCODONE-acetaminophen (PERCOCET/ROXICET) 5-325 MG per tablet   Oral   Take 1 tablet by mouth every 8 (eight) hours as needed for pain.   12 tablet   0     BP 134/76  Pulse 90  Temp(Src) 98.7 F (37.1 C) (Oral)  Resp 18  SpO2 98%  LMP  01/19/2013  Physical Exam  Constitutional: She is oriented to person, place, and time. She appears well-developed and well-nourished.  HENT:  Head: Normocephalic.  Neck: Normal range of motion.  Pulmonary/Chest: Effort normal.  Abdominal: Soft. She exhibits no distension.  Genitourinary: Vagina normal. No vaginal discharge found.  Musculoskeletal: Normal range of motion.  Neurological: She is alert and oriented to person, place, and time.  Skin: Skin is warm and dry.    ED Course  Procedures (including critical care time)  Labs Reviewed  CBC WITH DIFFERENTIAL - Abnormal; Notable for the following:    RBC 3.81 (*)    Hemoglobin 8.2 (*)    HCT 26.4 (*)    MCV 69.3 (*)    MCH 21.5 (*)    RDW 18.0 (*)    All other components within normal limits  COMPREHENSIVE METABOLIC PANEL - Abnormal; Notable for the following:    Sodium 128 (*)    Chloride 94 (*)    Glucose, Bld 105 (*)    Total Protein 8.6 (*)    Albumin 3.3 (*)    All other components within normal limits  URINALYSIS, MICROSCOPIC ONLY - Abnormal;  Notable for the following:    Color, Urine AMBER (*)    Specific Gravity, Urine 1.031 (*)    Hgb urine dipstick MODERATE (*)    Bilirubin Urine MODERATE (*)    Ketones, ur TRACE (*)    Protein, ur 30 (*)    Squamous Epithelial / LPF MANY (*)    All other components within normal limits  LIPASE, BLOOD  PREGNANCY, URINE  POCT PREGNANCY, URINE   No results found.   1. Anemia   2. Uterine fibroid   3. Hyponatremia       MDM   Hydrated for hyponaturemia  patient.  Instructed to followup with her OB/GYN if she develops  symptomatic anemia        Arman Filter, NP 01/27/13 0526  Arman Filter, NP 01/27/13 2002

## 2013-01-28 ENCOUNTER — Telehealth: Payer: Self-pay | Admitting: *Deleted

## 2013-01-28 ENCOUNTER — Encounter (HOSPITAL_COMMUNITY): Payer: Self-pay | Admitting: *Deleted

## 2013-01-28 ENCOUNTER — Observation Stay (HOSPITAL_COMMUNITY)
Admission: AD | Admit: 2013-01-28 | Discharge: 2013-01-29 | Disposition: A | Discharge status: Home / Self Care | Payer: Self-pay | Source: Ambulatory Visit | Attending: Obstetrics and Gynecology | Admitting: Obstetrics and Gynecology

## 2013-01-28 DIAGNOSIS — D259 Leiomyoma of uterus, unspecified: Principal | ICD-10-CM | POA: Insufficient documentation

## 2013-01-28 DIAGNOSIS — N949 Unspecified condition associated with female genital organs and menstrual cycle: Secondary | ICD-10-CM | POA: Insufficient documentation

## 2013-01-28 DIAGNOSIS — D509 Iron deficiency anemia, unspecified: Secondary | ICD-10-CM | POA: Insufficient documentation

## 2013-01-28 LAB — BASIC METABOLIC PANEL
Calcium: 9.1 mg/dL (ref 8.4–10.5)
GFR calc Af Amer: >90 mL/min (ref >90)
GFR calc non Af Amer: 83 mL/min — ABNORMAL LOW (ref >90)
Glucose, Bld: 95 mg/dL (ref 70–99)
Potassium: 3.6 mEq/L (ref 3.5–5.1)
Sodium: 135 mEq/L (ref 135–145)

## 2013-01-28 LAB — CBC
Hemoglobin: 8.5 g/dL — ABNORMAL LOW (ref 12.0–15.0)
MCH: 21.1 pg — ABNORMAL LOW (ref 26.0–34.0)
MCHC: 29.9 g/dL — ABNORMAL LOW (ref 30.0–36.0)

## 2013-01-28 MED ORDER — HYDROMORPHONE 0.3 MG/ML IV SOLN
INTRAVENOUS | Status: DC
  Administered 2013-01-28: 0.3 mg via INTRAVENOUS
  Administered 2013-01-28: 21:00:00 via INTRAVENOUS
  Administered 2013-01-29: 9 mL via INTRAVENOUS
  Administered 2013-01-29 (×2): 0.9 mg via INTRAVENOUS
  Filled 2013-01-28: qty 25

## 2013-01-28 MED ORDER — PRENATAL MULTIVITAMIN CH
1.0000 | ORAL_TABLET | Freq: Every day | ORAL | Status: DC
  Filled 2013-01-28 (×2): qty 1

## 2013-01-28 MED ORDER — SODIUM CHLORIDE 0.9 % IV SOLN
INTRAVENOUS | Status: DC
  Administered 2013-01-28 – 2013-01-29 (×2): via INTRAVENOUS

## 2013-01-28 MED ORDER — ONDANSETRON HCL 4 MG/2ML IJ SOLN: 4.0000 mg | Freq: Four times a day (QID) | INTRAMUSCULAR | Status: DC | PRN

## 2013-01-28 MED ORDER — FERUMOXYTOL INJECTION 510 MG/17 ML
510.0000 mg | Freq: Once | INTRAVENOUS | Status: AC
  Administered 2013-01-28: 510 mg via INTRAVENOUS
  Filled 2013-01-28: qty 17

## 2013-01-28 MED ORDER — SODIUM CHLORIDE 0.9 % IJ SOLN: 9.0000 mL | INTRAMUSCULAR | Status: DC | PRN

## 2013-01-28 MED ORDER — HYDROMORPHONE HCL PF 1 MG/ML IJ SOLN
1.0000 mg | Freq: Once | INTRAMUSCULAR | Status: AC
  Administered 2013-01-28: 1 mg via INTRAVENOUS
  Filled 2013-01-28: qty 1

## 2013-01-28 MED ORDER — DIPHENHYDRAMINE HCL 50 MG/ML IJ SOLN: 12.5000 mg | Freq: Four times a day (QID) | INTRAMUSCULAR | Status: DC | PRN

## 2013-01-28 MED ORDER — ENOXAPARIN SODIUM 40 MG/0.4ML ~~LOC~~ SOLN
40.0000 mg | SUBCUTANEOUS | Status: DC
  Administered 2013-01-28: 40 mg via SUBCUTANEOUS
  Filled 2013-01-28 (×2): qty 0.4

## 2013-01-28 MED ORDER — ONDANSETRON HCL 4 MG/2ML IJ SOLN
4.0000 mg | Freq: Once | INTRAMUSCULAR | Status: AC
  Administered 2013-01-28: 4 mg via INTRAVENOUS
  Filled 2013-01-28: qty 2

## 2013-01-28 MED ORDER — NALOXONE HCL 0.4 MG/ML IJ SOLN: 0.4000 mg | INTRAMUSCULAR | Status: DC | PRN

## 2013-01-28 MED ORDER — DIPHENHYDRAMINE HCL 12.5 MG/5ML PO ELIX: 12.5000 mg | ORAL_SOLUTION | Freq: Four times a day (QID) | ORAL | Status: DC | PRN

## 2013-01-28 NOTE — Telephone Encounter (Signed)
Patient left message requesting that I call her back. I returned her call and she answered the call crying. She said that she was hurting so bad and her pain was a "20". She said she couldn't even get up to walk. Also reported that she went to ER on the 11th but they didn't do anything. i advised that she come to [facility] if she had someone to bring her. She said her brother was going to try to bring her but the roads are bad. She stated that she may have to just call 911. I told her to have them bring her here to [facility] hospital. Pt agrees with plan.
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
## 2013-01-28 NOTE — MAU Provider Note (Signed)
History     CSN: [id number]  Arrival date and time: 01/28/13 1744   None     Chief Complaint  Patient presents with  . Abdominal Pain  . Fibroids   HPI Katie Doyle is a 47 y.o. female who presents to MAU with abdominal pain. The pain is chronic due to large fibroids; however, over the past few days the pain has become severe. She was evaluated at [facility] yesterday and had labs, ultrasound and pain management. She did well for a while until the pain returned. She called EMS today to bring her to MAU due to pain. She is a patient in the [facility]. The history was provided by the patient.   OB History   Grav Para Term Preterm Abortions TAB SAB Ect Mult Living   3 2 2  1 1    2       Past Medical History  Diagnosis Date  . Fibroids   . Allergy     Past Surgical History  Procedure Laterality Date  . Appendectomy    . Cesarean section      Family History  Problem Relation Age of Onset  . Diabetes Mother   . Hypertension Mother   . Cancer Father   . Diabetes Brother     History  Substance Use Topics  . Smoking status: Light Tobacco Smoker -- 0.25 packs/day    Types: Cigarettes  . Smokeless tobacco: Never Used  . Alcohol Use: No    Allergies:  Allergies  Allergen Reactions  . Penicillins Itching    Prescriptions prior to admission  Medication Sig Dispense Refill  . acetaminophen (TYLENOL) 500 MG tablet Take 1,000 mg by mouth every 6 (six) hours as needed for pain.      Marland Kitchen ibuprofen (ADVIL,MOTRIN) 200 MG tablet Take 400 mg by mouth every 6 (six) hours as needed for pain.      . medroxyPROGESTERone (PROVERA) 10 MG tablet Take 1 tablet (10 mg total) by mouth daily.  30 tablet  2  . oxyCODONE-acetaminophen (PERCOCET/ROXICET) 5-325 MG per tablet Take 1 tablet by mouth every 8 (eight) hours as needed for pain.  12 tablet  0    Review of Systems  Constitutional: Positive for chills. Negative for fever.  Eyes: Negative for blurred vision and double  vision.  Respiratory: Negative for cough and wheezing.   Cardiovascular: Negative for chest pain.  Gastrointestinal: Positive for nausea and abdominal pain.  Genitourinary: Negative for dysuria, urgency and frequency.  Musculoskeletal: Positive for back pain.  Skin: Negative for rash.  Neurological: Negative for dizziness and headaches.  Psychiatric/Behavioral: Negative for depression. The patient is not nervous/anxious.    Blood pressure 138/75, pulse 93, temperature 98 F (36.7 C), temperature source Oral, resp. rate 20, last menstrual period 01/19/2013, SpO2 100.00%.  Physical Exam  Nursing note and vitals reviewed. Constitutional: She is oriented to person, place, and time. She appears well-developed and well-nourished. No distress.  Appears uncomfortable  HENT:  Head: Normocephalic and atraumatic.  Eyes: EOM are normal.  Neck: Neck supple.  Cardiovascular: Normal rate.   Respiratory: Effort normal.  GI: Soft. She exhibits distension. There is generalized tenderness.  Musculoskeletal: Normal range of motion.  Neurological: She is alert and oriented to person, place, and time.  Skin: Skin is warm and dry.  Psychiatric: She has a normal mood and affect. Her behavior is normal. Judgment and thought content normal.   Procedures  Results for orders placed during the hospital encounter of  01/28/13 (from the past 24 hour(s))  CBC     Status: Abnormal   Collection Time    01/28/13  6:15 PM      Result Value Range   WBC 8.8  4.0 - 10.5 K/uL   RBC 4.02  3.87 - 5.11 MIL/uL   Hemoglobin 8.5 (*) 12.0 - 15.0 g/dL   HCT 16.1 (*) 09.6 - 04.5 %   MCV 70.6 (*) 78.0 - 100.0 fL   MCH 21.1 (*) 26.0 - 34.0 pg   MCHC 29.9 (*) 30.0 - 36.0 g/dL   RDW 40.9 (*) 81.1 - 91.4 %   Platelets 424 (*) 150 - 400 K/uL    Discussed with Dr. Emelda Fear and he will evaluate the patient in MAU.   NEESE,HOPE, RN, FNP, [facility] 01/28/2013, 6:28 PM

## 2013-01-28 NOTE — MAU Note (Signed)
Pt states she started having pain on Friday, she was let off work early " because she looked like she was going to pass out"

## 2013-01-29 MED ORDER — OXYCODONE-ACETAMINOPHEN 5-325 MG PO TABS
1.0000 | ORAL_TABLET | Freq: Four times a day (QID) | ORAL | Status: DC | PRN
  Administered 2013-01-29: 2 via ORAL
  Filled 2013-01-29: qty 2

## 2013-01-29 MED ORDER — OXYCODONE-ACETAMINOPHEN 10-325 MG PO TABS: 1.0000 | ORAL_TABLET | ORAL | Status: DC | PRN

## 2013-01-29 MED ORDER — FERROUS SULFATE 325 (65 FE) MG PO TBEC: 325.0000 mg | DELAYED_RELEASE_TABLET | Freq: Three times a day (TID) | ORAL | Status: DC

## 2013-01-29 NOTE — Progress Notes (Signed)
Pt out in wheelchair  Teaching complete   

## 2013-01-29 NOTE — Discharge Summary (Signed)
Physician Discharge Summary  Patient ID: Katie Doyle MRN: [id number] DOB/AGE: 1966-08-08 47 y.o.  Admit date: 01/28/2013 Discharge date: 01/29/2013  Admission Diagnoses:Fibroid uterus 28 wk size                                         Pain Management                                        Anemia, iron deficiency Discharge Diagnoses:  Principal Problem:   Uterine fibroid 28 wks Active Problems:   Anemia, iron deficiency Pain management  Discharged Condition: stable  Hospital Course: Oral analgesics, tolerated diet given IV iron Feraheme   Consults: None  Significant Diagnostic Studies: labs:  CBC    Component Value Date/Time   WBC 8.8 01/28/2013 1815   RBC 4.02 01/28/2013 1815   HGB 8.5* 01/28/2013 1815   HCT 28.4* 01/28/2013 1815   PLT 424* 01/28/2013 1815   MCV 70.6* 01/28/2013 1815   MCH 21.1* 01/28/2013 1815   MCHC 29.9* 01/28/2013 1815   RDW 18.1* 01/28/2013 1815   LYMPHSABS 1.5 01/27/2013 0123   MONOABS 1.0 01/27/2013 0123   EOSABS 0.1 01/27/2013 0123   BASOSABS 0.0 01/27/2013 0123      Treatments: therapies: IV iron and p.o analgesics**  Discharge Exam: Blood pressure 130/77, pulse 87, temperature 97.9 F (36.6 C), temperature source Oral, resp. rate 20, height 5\' 5"  (1.651 m), weight 99.791 kg (220 lb), last menstrual period 01/19/2013, SpO2 94.00%. General appearance: alert, cooperative and mild distress Resp: clear to auscultation bilaterally GI: normal findings: bowel sounds normal and fibroid uterus  tender, palpable to rt of midline  and abnormal findings:  tender uterus to palpation. Extremities: extremities normal, atraumatic, no cyanosis or edema  Disposition: 01-Home or Self Care     Medication List    ASK your doctor about these medications       medroxyPROGESTERone 10 MG tablet  Commonly known as:  PROVERA  Take 1 tablet (10 mg total) by mouth daily.     oxyCODONE-acetaminophen 5-325 MG per tablet  Commonly known as:  PERCOCET/ROXICET  Take 1  tablet by mouth every 8 (eight) hours as needed for pain.         SignedTilda Burrow 01/29/2013, 8:56 AM

## 2013-02-03 ENCOUNTER — Telehealth: Payer: Self-pay | Admitting: *Deleted

## 2013-02-03 NOTE — Telephone Encounter (Signed)
Called pt and pt informed what is stated below. I asked pt where was she in the process of filling out her financial assistance paperwork.  Pt stated that she still needed to return her income to them.  I also informed pt that we have been waiting several weeks for her to turn in her income info so that we can send off the a possible free Depo Lupron that could help with the fibroids.  Pt stated that she will come to our office tomorrow to bring income and I advised pt that if a provider is available we will be able to ask for a possible refill on her pain medication.  Pt stated understanding and did not have any further questions.

## 2013-02-03 NOTE — Telephone Encounter (Signed)
Pt left message stating that she has been to the hospital 2x since her last clinic visit. She is having complications and would like to discuss with MD or nurse.

## 2013-02-07 NOTE — MAU Provider Note (Signed)
Attestation of Attending Supervision of Advanced Practitioner: Evaluation and management procedures were performed by the PA/NP/CNM/OB Fellow under my supervision/collaboration. Chart reviewed and agree with management and plan. Patient to be observed overnight due to weather severity, lack of available transportation, and patient's dramatic presentation. Nissi Doffing V 02/07/2013 5:53 AM

## 2013-02-09 ENCOUNTER — Ambulatory Visit: Payer: Self-pay | Admitting: Obstetrics & Gynecology

## 2013-03-04 ENCOUNTER — Telehealth: Payer: Self-pay | Admitting: *Deleted

## 2013-03-04 NOTE — Telephone Encounter (Signed)
Pt left message requesting a nurse to call back. 

## 2013-03-05 NOTE — Telephone Encounter (Signed)
Called patient back and told her I received her message about wanting a call back. Patient stated she wanted to know if she had an appt or not because she couldn't remember and at her last appt she was told she would need surgery but didn't have insurance and filled out an application through [employer] and got a letter in the mail stating she would be 100% covered and wanted to know if this is all the paperwork she should have been expecting. Told patient it sounds like that is all the paperwork and she will be covered then and that she has an appt on 4/4 @ 10:45 with Dr Debroah Loop. Patient verbalized understanding and had no further questions

## 2013-03-20 ENCOUNTER — Ambulatory Visit (INDEPENDENT_AMBULATORY_CARE_PROVIDER_SITE_OTHER): Payer: Self-pay | Admitting: Obstetrics & Gynecology

## 2013-03-20 ENCOUNTER — Encounter: Payer: Self-pay | Admitting: Obstetrics & Gynecology

## 2013-03-20 VITALS — BP 142/96 | HR 76 | Temp 97.0°F | Ht 66.0 in | Wt 213.8 lb

## 2013-03-20 DIAGNOSIS — D219 Benign neoplasm of connective and other soft tissue, unspecified: Secondary | ICD-10-CM

## 2013-03-20 DIAGNOSIS — D259 Leiomyoma of uterus, unspecified: Secondary | ICD-10-CM

## 2013-03-20 MED ORDER — OXYCODONE-ACETAMINOPHEN 5-325 MG PO TABS: 1.0000 | ORAL_TABLET | Freq: Four times a day (QID) | ORAL | Status: DC | PRN

## 2013-03-20 NOTE — Progress Notes (Signed)
Pt has [employer] funding, will schedule TAH BSO. Procedure reviewed. See following note. Patient ID: Katie Doyle, female DOB: [birth year], 47 y.o. MRN: [id number]  Chief Complaint   Patient presents with   .  Menorrhagia     on Provera spotting now. Before medicine she was bleeding for about 3 months straight.   .  Fibroids   HPI  Katie Doyle is a 47 y.o. female. Long h/o fibroid uterus and increased menrrhagia, seen in [facility] ED.Less bleeding on Provera currently  HPI  Past Medical History   Diagnosis  Date   .  Fibroids    .  Allergy     Past Surgical History   Procedure  Date   .  Appendectomy    .  Cesarean section    History reviewed. No pertinent family history.  Social History  History   Substance Use Topics   .  Smoking status:  Light Tobacco Smoker -- 0.2 packs/day     Types:  Cigarettes   .  Smokeless tobacco:  Never Used   .  Alcohol Use:  No    Allergies   Allergen  Reactions   .  Penicillins  Itching    Current Outpatient Prescriptions   Medication  Sig  Dispense  Refill   .  medroxyPROGESTERone (PROVERA) 10 MG tablet  Take 1 tablet (10 mg total) by mouth daily.  30 tablet  2   .  Dextromethorphan-Guaifenesin (MUCINEX DM PO)  Take 20 mLs by mouth 2 (two) times daily as needed. For cough.     Review of Systems  Review of Systems  Gastrointestinal: Positive for abdominal pain and abdominal distention. Negative for nausea and vomiting.  Genitourinary: Positive for urgency, frequency and vaginal bleeding. Negative for vaginal discharge and vaginal pain.  Blood pressure 162/91, pulse 75, height 5' 5.5" (1.664 m), weight 219 lb 14.4 oz (99.746 kg), last menstrual period 01/18/2013.  Physical Exam  Physical Exam  Constitutional: She appears well-developed and well-nourished. No distress.  Pulmonary/Chest: Effort normal. No respiratory distress.  Abdominal: She exhibits distension and mass (firm mass to RUQ c/w large fibroid uterus). There is no tenderness.   Genitourinary:  Dark vaginal bleeding, cervix no lesions pap done, cervix prepped and EMB done after counseling and time out, sounds > 12 cm, dark bloody specimen  Skin: Skin is warm.  Psychiatric: She has a normal mood and affect. Her behavior is normal.  Data Reviewed  *RADIOLOGY REPORT*  Clinical Data: D U B. History of fibroids. Concern for  degenerating fibroid versus ovarian torsion.  TRANSABDOMINAL AND TRANSVAGINAL ULTRASOUND OF PELVIS  Technique: Both transabdominal and transvaginal ultrasound  examinations of the pelvis were performed. Transabdominal technique  was performed for global imaging of the pelvis including uterus,  ovaries, adnexal regions, and pelvic cul-de-sac.  It was necessary to proceed with endovaginal exam following the  transabdominal exam to visualize the ovaries and endometrium.  Comparison: None  Findings:  Uterus: There is diffuse multinodular enlargement of the uterus.  Uterus measures 24 x 12.8 x 21.3 cm. Multiple uterine masses are  demonstrated consistent with fibroids, including submucosal,  subserosal, and exophytic fibroids.  The largest lesions represent an exophytic fibroid arising from the  dome of the uterus measuring 8.1 x 7.3 x 7.7 cm. A right posterior  fibroid measures 8.6 x 8.2 x 9 cm. A left anterior fibroid  measures 6.1 x 5.6 x 7 cm and contains calcification. Right mid  submucosal fibroid measures 9.1 x 9.7  x 10.5 cm. No definite cystic  degeneration is demonstrated.  Endometrium: The endometrium is not visualized due to displacement  by the multiple fibroids.  Right ovary: Right ovary is not visualized. No abnormal masses  are demonstrated in the visualized adnexal region.  Left ovary: Left ovary is not visualized. No abnormal masses are  demonstrated in the visualized adnexal region.  Other findings: No free fluid  IMPRESSION:  Multiple uterine fibroids resulting in diffuse nodular enlargement  of the uterus. The ovaries and  endometrium are not visualized.  Original Report Authenticated By: Burman Nieves, M.D.  CBC    Component  Value  Date/Time    WBC  5.8  12/29/2012 1927    RBC  3.82*  12/29/2012 1927    HGB  8.2*  12/29/2012 1927    HCT  27.0*  12/29/2012 1927    PLT  349  12/29/2012 1927    MCV  70.7*  12/29/2012 1927    MCH  21.5*  12/29/2012 1927    MCHC  30.4  12/29/2012 1927    RDW  18.1*  12/29/2012 1927    LYMPHSABS  3.3  12/29/2012 1927    MONOABS  0.1  12/29/2012 1927    EOSABS  0.2  12/29/2012 1927    BASOSABS  0.0  12/29/2012 1927   Assessment  Menorrhagia large fibroids and anemia  Plan  Needs TAH/BSO procedure and risks discussed, apply for financial assistance and order Lupron Depot 11.25 mg asap, continue Provera, .  EMB 01/18/13 benign glands, pap benign   ARNOLD,JAMES  03/20/2013

## 2013-03-20 NOTE — Patient Instructions (Signed)
Hysterectomy Information   A hysterectomy is a procedure where your uterus is surgically removed. It will no longer be possible to have menstrual periods or to become pregnant. The tubes and ovaries can be removed (bilateral salpingo-oopherectomy) during this surgery as well.    REASONS FOR A HYSTERECTOMY  · Persistent, abnormal bleeding.  · Lasting (chronic) pelvic pain or infection.  · The lining of the uterus (endometrium) starts growing outside the uterus (endometriosis).  · The endometrium starts growing in the muscle of the uterus (adenomyosis).  · The uterus falls down into the vagina (pelvic organ prolapse).  · Symptomatic uterine fibroids.  · Precancerous cells.  · Cervical cancer or uterine cancer.  TYPES OF HYSTERECTOMIES  · Supracervical hysterectomy. This type removes the top part of the uterus, but not the cervix.  · Total hysterectomy. This type removes the uterus and cervix.  · Radical hysterectomy. This type removes the uterus, cervix, and the fibrous tissue that holds the uterus in place in the pelvis (parametrium).  WAYS A HYSTERECTOMY CAN BE PERFORMED  · Abdominal hysterectomy. A large surgical cut (incision) is made in the abdomen. The uterus is removed through this incision.  · Vaginal hysterectomy. An incision is made in the vagina. The uterus is removed through this incision. There are no abdominal incisions.  · Conventional laparoscopic hysterectomy. A thin, lighted tube with a camera (laparoscope) is inserted into 3 or 4 small incisions in the abdomen. The uterus is cut into small pieces. The small pieces are removed through the incisions, or they are removed through the vagina.  · Laparoscopic assisted vaginal hysterectomy (LAVH). Three or four small incisions are made in the abdomen. Part of the surgery is performed laparoscopically and part vaginally. The uterus is removed through the vagina.  · Robot-assisted laparoscopic hysterectomy. A laparoscope is inserted into 3 or 4 small  incisions in the abdomen. A computer-controlled device is used to give the surgeon a 3D image. This allows for more precise movements of surgical instruments. The uterus is cut into small pieces and removed through the incisions or removed through the vagina.  RISKS OF HYSTERECTOMY    · Bleeding and risk of blood transfusion. Tell your caregiver if you do not want to receive any blood products.  · Blood clots in the legs or lung.  · Infection.  · Injury to surrounding organs.  · Anesthesia problems or side effects.  · Conversion to an abdominal hysterectomy.  WHAT TO EXPECT AFTER A HYSTERECTOMY  · You will be given pain medicine.  · You will need to have someone with you for the first 3 to 5 days after you go home.  · You will need to follow up with your surgeon in 2 to 4 weeks after surgery to evaluate your progress.  · You may have early menopause symptoms like hot flashes, night sweats, and insomnia.  · If you had a hysterectomy for a problem that was not a cancer or a condition that could lead to cancer, then you no longer need Pap tests. However, even if you no longer need a Pap test, a regular exam is a good idea to make sure no other problems are starting.  Document Released: 05/29/2001 Document Revised: 02/25/2012 Document Reviewed: 07/14/2011  [employer]® Patient Information ©2013 [employer].

## 2013-03-23 ENCOUNTER — Other Ambulatory Visit: Payer: Self-pay | Admitting: Obstetrics & Gynecology

## 2013-04-09 ENCOUNTER — Telehealth: Payer: Self-pay | Admitting: General Practice

## 2013-04-09 ENCOUNTER — Other Ambulatory Visit: Payer: Self-pay | Admitting: Obstetrics & Gynecology

## 2013-04-09 DIAGNOSIS — D219 Benign neoplasm of connective and other soft tissue, unspecified: Secondary | ICD-10-CM

## 2013-04-09 MED ORDER — OXYCODONE-ACETAMINOPHEN 5-325 MG PO TABS: 1.0000 | ORAL_TABLET | Freq: Four times a day (QID) | ORAL | Status: DC | PRN

## 2013-04-09 NOTE — Telephone Encounter (Signed)
Patient called and left message stating she would like someone to call her back soon. Called patient back and told her I received her message- Patient stated she only had a couple percocet left and then she would be out and she would like a refill. Told patient I was waiting to hear back from Dr Debroah Loop and once I hear back from him I will call her back. Patient verbalized understanding.

## 2013-04-09 NOTE — Telephone Encounter (Signed)
Called patient back and informed her Dr Debroah Loop refilled her Percocet Rx and she can come and pick up the Rx at our office. Patient verbalized understanding and had no further questions

## 2013-04-20 ENCOUNTER — Telehealth: Payer: Self-pay | Admitting: *Deleted

## 2013-04-20 NOTE — Telephone Encounter (Signed)
Returned patients call, went straight to voicemail. Left her a message that I am returning her call.

## 2013-04-20 NOTE — Telephone Encounter (Signed)
Patient left a message requesting that a nurse call back.

## 2013-04-22 NOTE — Telephone Encounter (Signed)
Returned pt's call and left message on her voicemail that if she is still having a medical concern or question, she may call back with a new message to the nurse voicemail.

## 2013-04-27 ENCOUNTER — Encounter: Payer: Self-pay | Admitting: *Deleted

## 2013-05-05 ENCOUNTER — Telehealth: Payer: Self-pay | Admitting: *Deleted

## 2013-05-05 DIAGNOSIS — R102 Pelvic and perineal pain: Secondary | ICD-10-CM

## 2013-05-05 DIAGNOSIS — D219 Benign neoplasm of connective and other soft tissue, unspecified: Secondary | ICD-10-CM

## 2013-05-05 NOTE — Telephone Encounter (Addendum)
Pt left message stating that she called last week for medication refill and has not received a call back. I returned pt's call and she stated that she is wanting refill of oxycodone for pain. She is scheduled for surgery w/Dr. Debroah Loop on 05/14/13. I stated that I will be able to speak w/Dr. Debroah Loop tomorrow morning in regard to her request. I can call her back in the morning.  Pt agreed and voiced understanding.

## 2013-05-06 MED ORDER — OXYCODONE-ACETAMINOPHEN 5-325 MG PO TABS: 1.0000 | ORAL_TABLET | Freq: Four times a day (QID) | ORAL | Status: DC | PRN

## 2013-05-06 NOTE — Telephone Encounter (Signed)
Katie Doyle called back and left 2 messages stating she talked to someone yesterday and knows the nurse is supposed to call her about the refill but her phone was cut off. Left a new number to call her.  Discussed with Dr. Debroah Loop and he has approved a refill of oxycodone 20 tabs to last until surgery next week.  Called Katie Doyle and notified her prescription refill was approved and will be ready for pickup in about 30 minutes.

## 2013-05-07 ENCOUNTER — Encounter (HOSPITAL_COMMUNITY)
Admission: RE | Admit: 2013-05-07 | Discharge: 2013-05-07 | Disposition: A | Discharge status: Home / Self Care | Payer: Self-pay | Source: Ambulatory Visit | Attending: Obstetrics & Gynecology | Admitting: Obstetrics & Gynecology

## 2013-05-07 ENCOUNTER — Encounter (HOSPITAL_COMMUNITY): Payer: Self-pay

## 2013-05-07 HISTORY — DX: Unspecified asthma, uncomplicated: J45.909

## 2013-05-07 HISTORY — DX: Anemia, unspecified: D64.9

## 2013-05-07 LAB — CBC
Platelets: 342 10*3/uL (ref 150–400)
RDW: 19.5 % — ABNORMAL HIGH (ref 11.5–15.5)
WBC: 6 10*3/uL (ref 4.0–10.5)

## 2013-05-07 LAB — SURGICAL PCR SCREEN: Staphylococcus aureus: POSITIVE — AB

## 2013-05-07 NOTE — Patient Instructions (Addendum)
   Your procedure is scheduled on:  Thursday, May 29  Enter through the Main Entrance of [facility] at: 10 am Pick up the phone at the desk and dial [phone] and inform us of your arrival.  Please call this number if you have any problems the morning of surgery: [phone]  Remember: Do not eat or drink after midnight: Wednesday Take these medicines the morning of surgery with a SIP OF WATER: None.  Patient to bring albuterol inhaler with her on day of surgery.  Do not wear jewelry, make-up, or FINGER nail polish No metal in your hair or on your body. Do not wear lotions, powders, perfumes. You may wear deodorant.  Please use your CHG wash as directed prior to surgery.  Do not shave anywhere for at least 12 hours prior to first CHG shower.  Do not bring valuables to the hospital. Contacts, dentures or bridgework may not be worn into surgery.  Leave suitcase in the car. After Surgery it may be brought to your room. For patients being admitted to the hospital, checkout time is 11:00am the day of discharge.  Home with daughter Doreatha Massed or brother Saul Fordyce or Assunta Curtis, daughter.

## 2013-05-13 MED ORDER — GENTAMICIN SULFATE 40 MG/ML IJ SOLN
INTRAVENOUS | Status: AC
  Administered 2013-05-14: 100 mL via INTRAVENOUS
  Filled 2013-05-13: qty 9.15

## 2013-05-14 ENCOUNTER — Other Ambulatory Visit: Payer: Self-pay | Admitting: Obstetrics & Gynecology

## 2013-05-14 ENCOUNTER — Encounter (HOSPITAL_COMMUNITY)
Admission: RE | Disposition: A | Discharge status: Home / Self Care | Payer: Self-pay | Source: Ambulatory Visit | Attending: Obstetrics & Gynecology

## 2013-05-14 ENCOUNTER — Encounter (HOSPITAL_COMMUNITY): Payer: Self-pay | Admitting: Anesthesiology

## 2013-05-14 ENCOUNTER — Inpatient Hospital Stay (HOSPITAL_COMMUNITY): Payer: Self-pay | Admitting: Anesthesiology

## 2013-05-14 ENCOUNTER — Inpatient Hospital Stay (HOSPITAL_COMMUNITY)
Admission: RE | Admit: 2013-05-14 | Discharge: 2013-05-16 | DRG: 743 | Disposition: A | Discharge status: Home / Self Care | Payer: MEDICAID | Source: Ambulatory Visit | Attending: Obstetrics & Gynecology | Admitting: Obstetrics & Gynecology

## 2013-05-14 DIAGNOSIS — D509 Iron deficiency anemia, unspecified: Secondary | ICD-10-CM

## 2013-05-14 DIAGNOSIS — D250 Submucous leiomyoma of uterus: Secondary | ICD-10-CM | POA: Diagnosis present

## 2013-05-14 DIAGNOSIS — D259 Leiomyoma of uterus, unspecified: Secondary | ICD-10-CM

## 2013-05-14 DIAGNOSIS — D649 Anemia, unspecified: Secondary | ICD-10-CM

## 2013-05-14 DIAGNOSIS — R102 Pelvic and perineal pain: Secondary | ICD-10-CM

## 2013-05-14 DIAGNOSIS — D252 Subserosal leiomyoma of uterus: Secondary | ICD-10-CM | POA: Diagnosis present

## 2013-05-14 DIAGNOSIS — N920 Excessive and frequent menstruation with regular cycle: Principal | ICD-10-CM | POA: Diagnosis present

## 2013-05-14 DIAGNOSIS — D251 Intramural leiomyoma of uterus: Secondary | ICD-10-CM | POA: Diagnosis present

## 2013-05-14 DIAGNOSIS — N831 Corpus luteum cyst of ovary, unspecified side: Secondary | ICD-10-CM | POA: Diagnosis present

## 2013-05-14 DIAGNOSIS — D500 Iron deficiency anemia secondary to blood loss (chronic): Secondary | ICD-10-CM | POA: Diagnosis present

## 2013-05-14 DIAGNOSIS — D219 Benign neoplasm of connective and other soft tissue, unspecified: Secondary | ICD-10-CM

## 2013-05-14 HISTORY — PX: ABDOMINAL HYSTERECTOMY: SHX81

## 2013-05-14 HISTORY — PX: CYSTO: SHX6284

## 2013-05-14 HISTORY — PX: SALPINGOOPHORECTOMY: SHX82

## 2013-05-14 LAB — BASIC METABOLIC PANEL
GFR calc Af Amer: >90 mL/min (ref >90)
GFR calc non Af Amer: 78 mL/min — ABNORMAL LOW (ref >90)
Glucose, Bld: 138 mg/dL — ABNORMAL HIGH (ref 70–99)
Potassium: 4.3 mEq/L (ref 3.5–5.1)
Sodium: 134 mEq/L — ABNORMAL LOW (ref 135–145)

## 2013-05-14 LAB — PROTIME-INR
INR: 1.15 (ref 0.00–1.49)
Prothrombin Time: 14.5 seconds (ref 11.6–15.2)

## 2013-05-14 LAB — CBC
Hemoglobin: 10.4 g/dL — ABNORMAL LOW (ref 12.0–15.0)
MCHC: 32.8 g/dL (ref 30.0–36.0)
RDW: 17.9 % — ABNORMAL HIGH (ref 11.5–15.5)
WBC: 15.8 10*3/uL — ABNORMAL HIGH (ref 4.0–10.5)

## 2013-05-14 SURGERY — HYSTERECTOMY, ABDOMINAL
Anesthesia: General | Site: Bladder | Wound class: Clean

## 2013-05-14 MED ORDER — GLYCOPYRROLATE 0.2 MG/ML IJ SOLN
INTRAMUSCULAR | Status: DC | PRN
  Administered 2013-05-14: .5 mg via INTRAVENOUS

## 2013-05-14 MED ORDER — ONDANSETRON HCL 4 MG/2ML IJ SOLN
INTRAMUSCULAR | Status: AC
  Filled 2013-05-14: qty 2

## 2013-05-14 MED ORDER — HYDROMORPHONE HCL PF 1 MG/ML IJ SOLN
INTRAMUSCULAR | Status: AC
  Administered 2013-05-14: 0.5 mg via INTRAVENOUS
  Filled 2013-05-14: qty 1

## 2013-05-14 MED ORDER — KETOROLAC TROMETHAMINE 30 MG/ML IJ SOLN: 30.0000 mg | Freq: Four times a day (QID) | INTRAMUSCULAR | Status: DC

## 2013-05-14 MED ORDER — ONDANSETRON HCL 4 MG/2ML IJ SOLN: 4.0000 mg | Freq: Four times a day (QID) | INTRAMUSCULAR | Status: DC | PRN

## 2013-05-14 MED ORDER — ACETAMINOPHEN 160 MG/5ML PO SOLN
ORAL | Status: AC
  Administered 2013-05-14: 975 mg via ORAL
  Filled 2013-05-14: qty 40.6

## 2013-05-14 MED ORDER — PNEUMOCOCCAL VAC POLYVALENT 25 MCG/0.5ML IJ INJ
0.5000 mL | INJECTION | INTRAMUSCULAR | Status: AC
  Filled 2013-05-14: qty 0.5

## 2013-05-14 MED ORDER — LACTATED RINGERS IV SOLN: INTRAVENOUS | Status: DC

## 2013-05-14 MED ORDER — SODIUM CHLORIDE 0.9 % IV SOLN
INTRAVENOUS | Status: DC | PRN
  Administered 2013-05-14: 12:00:00 via INTRAVENOUS

## 2013-05-14 MED ORDER — INDIGOTINDISULFONATE SODIUM 8 MG/ML IJ SOLN
INTRAMUSCULAR | Status: AC
  Filled 2013-05-14: qty 5

## 2013-05-14 MED ORDER — DIPHENHYDRAMINE HCL 50 MG/ML IJ SOLN: 12.5000 mg | Freq: Four times a day (QID) | INTRAMUSCULAR | Status: DC | PRN

## 2013-05-14 MED ORDER — DEXAMETHASONE SODIUM PHOSPHATE 10 MG/ML IJ SOLN
INTRAMUSCULAR | Status: AC
  Filled 2013-05-14: qty 1

## 2013-05-14 MED ORDER — 0.9 % SODIUM CHLORIDE (POUR BTL) OPTIME
TOPICAL | Status: DC | PRN
  Administered 2013-05-14: 1000 mL

## 2013-05-14 MED ORDER — ACETAMINOPHEN 160 MG/5ML PO SOLN
975.0000 mg | Freq: Once | ORAL | Status: AC
  Administered 2013-05-14: 975 mg via ORAL

## 2013-05-14 MED ORDER — FENTANYL CITRATE 0.05 MG/ML IJ SOLN
INTRAMUSCULAR | Status: AC
  Filled 2013-05-14: qty 10

## 2013-05-14 MED ORDER — LIDOCAINE HCL (CARDIAC) 20 MG/ML IV SOLN
INTRAVENOUS | Status: DC | PRN
  Administered 2013-05-14: 50 mg via INTRAVENOUS

## 2013-05-14 MED ORDER — DIPHENHYDRAMINE HCL 12.5 MG/5ML PO ELIX
12.5000 mg | ORAL_SOLUTION | Freq: Four times a day (QID) | ORAL | Status: DC | PRN
  Administered 2013-05-14 – 2013-05-15 (×2): 12.5 mg via ORAL
  Filled 2013-05-14 (×2): qty 5

## 2013-05-14 MED ORDER — BUPIVACAINE HCL (PF) 0.5 % IJ SOLN
INTRAMUSCULAR | Status: AC
  Filled 2013-05-14: qty 30

## 2013-05-14 MED ORDER — LACTATED RINGERS IV SOLN
INTRAVENOUS | Status: DC
  Administered 2013-05-14 (×4): via INTRAVENOUS

## 2013-05-14 MED ORDER — DEXTROSE IN LACTATED RINGERS 5 % IV SOLN
INTRAVENOUS | Status: DC
  Administered 2013-05-14 – 2013-05-15 (×3): via INTRAVENOUS

## 2013-05-14 MED ORDER — ONDANSETRON HCL 4 MG PO TABS: 4.0000 mg | ORAL_TABLET | Freq: Four times a day (QID) | ORAL | Status: DC | PRN

## 2013-05-14 MED ORDER — TEMAZEPAM 15 MG PO CAPS
15.0000 mg | ORAL_CAPSULE | Freq: Every evening | ORAL | Status: DC | PRN
  Administered 2013-05-15: 30 mg via ORAL
  Filled 2013-05-14 (×2): qty 1

## 2013-05-14 MED ORDER — NALOXONE HCL 0.4 MG/ML IJ SOLN: 0.4000 mg | INTRAMUSCULAR | Status: DC | PRN

## 2013-05-14 MED ORDER — PROPOFOL 10 MG/ML IV EMUL
INTRAVENOUS | Status: AC
  Filled 2013-05-14: qty 20

## 2013-05-14 MED ORDER — SODIUM CHLORIDE 0.9 % IJ SOLN: 9.0000 mL | INTRAMUSCULAR | Status: DC | PRN

## 2013-05-14 MED ORDER — PROPOFOL 10 MG/ML IV BOLUS
INTRAVENOUS | Status: DC | PRN
  Administered 2013-05-14: 180 mg via INTRAVENOUS

## 2013-05-14 MED ORDER — ROCURONIUM BROMIDE 50 MG/5ML IV SOLN
INTRAVENOUS | Status: AC
  Filled 2013-05-14: qty 1

## 2013-05-14 MED ORDER — LIDOCAINE HCL (CARDIAC) 20 MG/ML IV SOLN
INTRAVENOUS | Status: AC
  Filled 2013-05-14: qty 5

## 2013-05-14 MED ORDER — MIDAZOLAM HCL 2 MG/2ML IJ SOLN
INTRAMUSCULAR | Status: AC
  Filled 2013-05-14: qty 2

## 2013-05-14 MED ORDER — KETOROLAC TROMETHAMINE 30 MG/ML IJ SOLN: 15.0000 mg | Freq: Once | INTRAMUSCULAR | Status: DC | PRN

## 2013-05-14 MED ORDER — CEFAZOLIN SODIUM-DEXTROSE 2-3 GM-% IV SOLR: 2.0000 g | Freq: Once | INTRAVENOUS | Status: DC

## 2013-05-14 MED ORDER — KETOROLAC TROMETHAMINE 30 MG/ML IJ SOLN
30.0000 mg | Freq: Four times a day (QID) | INTRAMUSCULAR | Status: DC
  Administered 2013-05-14 – 2013-05-15 (×3): 30 mg via INTRAVENOUS
  Filled 2013-05-14 (×3): qty 1

## 2013-05-14 MED ORDER — ONDANSETRON HCL 4 MG/2ML IJ SOLN
INTRAMUSCULAR | Status: DC | PRN
  Administered 2013-05-14: 4 mg via INTRAVENOUS

## 2013-05-14 MED ORDER — INDIGOTINDISULFONATE SODIUM 8 MG/ML IJ SOLN
INTRAMUSCULAR | Status: DC | PRN
  Administered 2013-05-14: 5 mL via INTRAVENOUS

## 2013-05-14 MED ORDER — DEXAMETHASONE SODIUM PHOSPHATE 4 MG/ML IJ SOLN
INTRAMUSCULAR | Status: DC | PRN
  Administered 2013-05-14: 8 mg via INTRAVENOUS

## 2013-05-14 MED ORDER — HYDROMORPHONE 0.3 MG/ML IV SOLN
INTRAVENOUS | Status: DC
  Administered 2013-05-14: 4 mL via INTRAVENOUS
  Administered 2013-05-14: 2.7 mg via INTRAVENOUS
  Administered 2013-05-14: 17:00:00 via INTRAVENOUS
  Administered 2013-05-15: 0.6 mg via INTRAVENOUS
  Administered 2013-05-15: 1.5 mg via INTRAVENOUS
  Filled 2013-05-14: qty 25

## 2013-05-14 MED ORDER — BUPIVACAINE HCL (PF) 0.5 % IJ SOLN
INTRAMUSCULAR | Status: DC | PRN
  Administered 2013-05-14: 20 mL

## 2013-05-14 MED ORDER — HYDROMORPHONE HCL PF 1 MG/ML IJ SOLN
0.2500 mg | INTRAMUSCULAR | Status: DC | PRN
  Administered 2013-05-14: 0.5 mg via INTRAVENOUS

## 2013-05-14 MED ORDER — ROCURONIUM BROMIDE 100 MG/10ML IV SOLN
INTRAVENOUS | Status: DC | PRN
  Administered 2013-05-14 (×2): 5 mg via INTRAVENOUS
  Administered 2013-05-14 (×2): 10 mg via INTRAVENOUS
  Administered 2013-05-14: 40 mg via INTRAVENOUS

## 2013-05-14 MED ORDER — MIDAZOLAM HCL 5 MG/5ML IJ SOLN
INTRAMUSCULAR | Status: DC | PRN
  Administered 2013-05-14: 2 mg via INTRAVENOUS

## 2013-05-14 MED ORDER — HYDROMORPHONE HCL PF 1 MG/ML IJ SOLN
INTRAMUSCULAR | Status: AC
  Filled 2013-05-14: qty 1

## 2013-05-14 MED ORDER — OXYCODONE-ACETAMINOPHEN 5-325 MG PO TABS
1.0000 | ORAL_TABLET | ORAL | Status: DC | PRN
  Administered 2013-05-15 – 2013-05-16 (×7): 2 via ORAL
  Filled 2013-05-14 (×7): qty 2

## 2013-05-14 MED ORDER — FENTANYL CITRATE 0.05 MG/ML IJ SOLN
INTRAMUSCULAR | Status: DC | PRN
  Administered 2013-05-14 (×7): 50 ug via INTRAVENOUS
  Administered 2013-05-14: 100 ug via INTRAVENOUS
  Administered 2013-05-14: 50 ug via INTRAVENOUS

## 2013-05-14 MED ORDER — HYDROMORPHONE HCL PF 1 MG/ML IJ SOLN
INTRAMUSCULAR | Status: DC | PRN
  Administered 2013-05-14 (×2): 0.5 mg via INTRAVENOUS

## 2013-05-14 MED ORDER — NEOSTIGMINE METHYLSULFATE 1 MG/ML IJ SOLN
INTRAMUSCULAR | Status: DC | PRN
  Administered 2013-05-14: 3 mg via INTRAVENOUS

## 2013-05-14 SURGICAL SUPPLY — 50 items
CANISTER SUCTION 2500CC (MISCELLANEOUS) ×4 IMPLANT
CHLORAPREP W/TINT 26ML (MISCELLANEOUS) ×4 IMPLANT
CLOTH BEACON ORANGE TIMEOUT ST (SAFETY) ×4 IMPLANT
CONT PATH 16OZ SNAP LID 3702 (MISCELLANEOUS) ×3 IMPLANT
COVER TABLE BACK 60X90 (DRAPES) ×1 IMPLANT
DRAPE PROXIMA HALF (DRAPES) ×1 IMPLANT
DRAPE UNDERBUTTOCKS STRL (DRAPE) ×1 IMPLANT
DRESSING TELFA 8X3 (GAUZE/BANDAGES/DRESSINGS) ×2 IMPLANT
GAUZE SPONGE 4X4 12PLY STRL LF (GAUZE/BANDAGES/DRESSINGS) ×1 IMPLANT
GAUZE SPONGE 4X4 16PLY XRAY LF (GAUZE/BANDAGES/DRESSINGS) ×5 IMPLANT
GLOVE BIO SURGEON STRL SZ 6 (GLOVE) ×1 IMPLANT
GLOVE BIO SURGEON STRL SZ 6.5 (GLOVE) ×4 IMPLANT
GLOVE BIOGEL PI IND STRL 6.5 (GLOVE) IMPLANT
GLOVE BIOGEL PI IND STRL 7.0 (GLOVE) ×3 IMPLANT
GLOVE BIOGEL PI IND STRL 7.5 (GLOVE) IMPLANT
GLOVE BIOGEL PI INDICATOR 6.5 (GLOVE) ×1
GLOVE BIOGEL PI INDICATOR 7.0 (GLOVE) ×4
GLOVE BIOGEL PI INDICATOR 7.5 (GLOVE) ×2
GLOVE ECLIPSE 6.5 STRL STRAW (GLOVE) ×1 IMPLANT
GLOVE ECLIPSE 7.0 STRL STRAW (GLOVE) ×1 IMPLANT
GLOVE INDICATOR 7.0 STRL GRN (GLOVE) ×1 IMPLANT
GLOVE SURG SS PI 7.0 STRL IVOR (GLOVE) ×1 IMPLANT
GOWN PREVENTION PLUS LG XLONG (DISPOSABLE) ×13 IMPLANT
GOWN PREVENTION PLUS XXLARGE (GOWN DISPOSABLE) ×1 IMPLANT
GOWN SRG XL XLNG 56XLVL 4 (GOWN DISPOSABLE) IMPLANT
GOWN STRL NON-REIN XL XLG LVL4 (GOWN DISPOSABLE) ×4
NEEDLE HYPO 22GX1.5 SAFETY (NEEDLE) ×4 IMPLANT
NS IRRIG 1000ML POUR BTL (IV SOLUTION) ×4 IMPLANT
PACK ABDOMINAL GYN (CUSTOM PROCEDURE TRAY) ×4 IMPLANT
PAD ABD 7.5X8 STRL (GAUZE/BANDAGES/DRESSINGS) ×2 IMPLANT
PAD OB MATERNITY 4.3X12.25 (PERSONAL CARE ITEMS) ×4 IMPLANT
PENCIL BUTTON HOLSTER BLD 10FT (ELECTRODE) ×1 IMPLANT
PROTECTOR NERVE ULNAR (MISCELLANEOUS) ×4 IMPLANT
SET CYSTO W/LG BORE CLAMP LF (SET/KITS/TRAYS/PACK) ×1 IMPLANT
SPONGE LAP 18X18 X RAY DECT (DISPOSABLE) ×9 IMPLANT
STAPLER VISISTAT 35W (STAPLE) IMPLANT
SUT VIC AB 0 CT1 18XCR BRD8 (SUTURE) ×12 IMPLANT
SUT VIC AB 0 CT1 27 (SUTURE)
SUT VIC AB 0 CT1 27XBRD ANBCTR (SUTURE) ×3 IMPLANT
SUT VIC AB 0 CT1 36 (SUTURE) ×8 IMPLANT
SUT VIC AB 0 CT1 8-18 (SUTURE) ×16
SUT VIC AB 2-0 CT1 27 (SUTURE) ×4
SUT VIC AB 2-0 CT1 TAPERPNT 27 (SUTURE) ×3 IMPLANT
SUT VIC AB 4-0 PS2 27 (SUTURE) ×1 IMPLANT
SUT VICRYL 0 TIES 12 18 (SUTURE) ×4 IMPLANT
SYR CONTROL 10ML LL (SYRINGE) ×4 IMPLANT
SYRINGE 10CC LL (SYRINGE) ×1 IMPLANT
TOWEL OR 17X24 6PK STRL BLUE (TOWEL DISPOSABLE) ×8 IMPLANT
TRAY FOLEY CATH 14FR (SET/KITS/TRAYS/PACK) ×5 IMPLANT
WATER STERILE IRR 1000ML POUR (IV SOLUTION) ×3 IMPLANT

## 2013-05-14 NOTE — OR Nursing (Signed)
15:15 Dr. Debroah Loop gives order to hold off on drawing blood work until 2 hours after blood infused per lab protocol.Katie Mail rn

## 2013-05-14 NOTE — H&P (Signed)
Pt has [facility] funding, will schedule TAH BSO. Procedure reviewed. See following note.  Patient ID: Katie Doyle, female DOB: 1966-10-17, 47 y.o. MRN: [id number]  Chief Complaint   Patient presents with   .  Menorrhagia     on Provera spotting now. Before medicine she was bleeding for about 3 months straight.   .  Fibroids   HPI  Katie Doyle is a 47 y.o. female. Long h/o fibroid uterus and increased menrrhagia, seen in [facility] ED.Less bleeding on Provera currently LMP 1 week ago, heavy, now only spotting.  HPI  Past Medical History   Diagnosis  Date   .  Fibroids    .  Allergy     Past Surgical History   Procedure  Date   .  Appendectomy    .  Cesarean section    History reviewed. No pertinent family history.  Social History  History   Substance Use Topics   .  Smoking status:  Light Tobacco Smoker -- 0.2 packs/day     Types:  Cigarettes   .  Smokeless tobacco:  Never Used   .  Alcohol Use:  No    Allergies   Allergen  Reactions   .  Penicillins  Itching    Current Outpatient Prescriptions   Medication  Sig  Dispense  Refill   .  medroxyPROGESTERone (PROVERA) 10 MG tablet  Take 1 tablet (10 mg total) by mouth daily.  30 tablet  2   .  Dextromethorphan-Guaifenesin (MUCINEX DM PO)  Take 20 mLs by mouth 2 (two) times daily as needed. For cough.     Review of Systems  Review of Systems  Gastrointestinal: Positive for abdominal pain and abdominal distention. Negative for nausea and vomiting.  Genitourinary: Positive for urgency, frequency and vaginal bleeding. Negative for vaginal discharge and vaginal pain.  Filed Vitals:   05/14/13 1005  BP: 154/90  Temp: 98.4 F (36.9 C)  Resp: 18    Physical Exam  Physical Exam  Constitutional: She appears well-developed and well-nourished. No distress.  Pulmonary/Chest: Effort normal. No respiratory distress. Chest clear Heart RRR Abdominal: She exhibits distension and mass (firm mass to RUQ c/w large fibroid uterus). There is  no tenderness.  Skin: Skin is warm. Not pale Psychiatric: She has a normal mood and affect. Her behavior is normal.  Data Reviewed  *RADIOLOGY REPORT*  Clinical Data: D U B. History of fibroids. Concern for  degenerating fibroid versus ovarian torsion.  TRANSABDOMINAL AND TRANSVAGINAL ULTRASOUND OF PELVIS  Technique: Both transabdominal and transvaginal ultrasound  examinations of the pelvis were performed. Transabdominal technique  was performed for global imaging of the pelvis including uterus,  ovaries, adnexal regions, and pelvic cul-de-sac.  It was necessary to proceed with endovaginal exam following the  transabdominal exam to visualize the ovaries and endometrium.  Comparison: None  Findings:  Uterus: There is diffuse multinodular enlargement of the uterus.  Uterus measures 24 x 12.8 x 21.3 cm. Multiple uterine masses are  demonstrated consistent with fibroids, including submucosal,  subserosal, and exophytic fibroids.  The largest lesions represent an exophytic fibroid arising from the  dome of the uterus measuring 8.1 x 7.3 x 7.7 cm. A right posterior  fibroid measures 8.6 x 8.2 x 9 cm. A left anterior fibroid  measures 6.1 x 5.6 x 7 cm and contains calcification. Right mid  submucosal fibroid measures 9.1 x 9.7 x 10.5 cm. No definite cystic  degeneration is demonstrated.  Endometrium: The endometrium is not  visualized due to displacement  by the multiple fibroids.  Right ovary: Right ovary is not visualized. No abnormal masses  are demonstrated in the visualized adnexal region.  Left ovary: Left ovary is not visualized. No abnormal masses are  demonstrated in the visualized adnexal region.  Other findings: No free fluid  IMPRESSION:  Multiple uterine fibroids resulting in diffuse nodular enlargement  of the uterus. The ovaries and endometrium are not visualized.  Original Report Authenticated By: Burman Nieves, M.D.  CBC    Component Value Date/Time   WBC 6.0  05/07/2013 0850   RBC 3.85* 05/07/2013 0850   HGB 9.2* 05/07/2013 0850   HCT 29.8* 05/07/2013 0850   PLT 342 05/07/2013 0850   MCV 77.4* 05/07/2013 0850   MCH 23.9* 05/07/2013 0850   MCHC 30.9 05/07/2013 0850   RDW 19.5* 05/07/2013 0850   LYMPHSABS 1.5 01/27/2013 0123   MONOABS 1.0 01/27/2013 0123   EOSABS 0.1 01/27/2013 0123   BASOSABS 0.0 01/27/2013 0123     CBC    Component  Value  Date/Time    WBC  5.8  12/29/2012 1927    RBC  3.82*  12/29/2012 1927    HGB  8.2*  12/29/2012 1927    HCT  27.0*  12/29/2012 1927    PLT  349  12/29/2012 1927    MCV  70.7*  12/29/2012 1927    MCH  21.5*  12/29/2012 1927    MCHC  30.4  12/29/2012 1927    RDW  18.1*  12/29/2012 1927    LYMPHSABS  3.3  12/29/2012 1927    MONOABS  0.1  12/29/2012 1927    EOSABS  0.2  12/29/2012 1927    BASOSABS  0.0  12/29/2012 1927   Assessment  Menorrhagia large fibroids and anemia  Plan  Needs TAH/BSO procedure  procedure and the risk of anesthesia, bleeding, infection, bowel and bladder injury were discussed and her questions were answered. .  EMB 01/18/13 benign glands, pap benign   Adam Phenix, MD 05/14/2013 11:01 AM

## 2013-05-14 NOTE — Op Note (Signed)
Procedure: Total vaginal hysterectomy and bilateral oophorectomy and intraoperative cystoscopy Preoperative diagnosis: Large symptomatic fibroid uterus Postoperative diagnosis: Same Surgeon: Dr. Scheryl Darter Assistants: Dr. Catalina Antigua and Dr. Tinnie Gens Anesthesia: Gen. endotracheal Estimated blood loss: 2500 mL Intraoperative fluids: 2 units of packed red blood cells and 3000 mL of crystalloid Complications: Excessive blood loss intraoperatively Drains: Foley catheter Findings large fibroid uterus. Cystoscopy showed functioning ureters bilaterally  Patient gave written consent for total abdominal hysterectomy bilateral salpingo-oophorectomy. Patient identification was confirmed and she is brought the operating room and general anesthesia was induced. His placed in dorsal supine position. Abdomen and perineum were and vagina were sterilely prepped and draped and Foley catheter was placed. Large fibroid uterus was palpable nearly to the umbilicus. #10 blade was used to make a Pfannenstiel incision down to fascia. Fascia was incised and incision was extended transversely. Fascia was separated from underlying tissue attachments with blunt and sharp dissection and hemostasis was assured. Bellies of her muscles were separated. Underlying peritoneum was entered at midline incision was extended with Metzenbaum scissors and cautery. Large fibroid uterus with exophytic fibroids on the surface was identified. This was elevated from the pelvis through the incision. There were some bladder adhesions from her previous cesarean section. The right adnexa appeared normal. There were more adhesions on the left side and the left appeared small and encased in some adhesions involving the fallopian tube. The round ligaments were suture ligated and incised and bladder flap was created with Metzenbaum scissors. There were large dilated uterine vessels identified high left side. His were doubly clamped and cut and suture  ligated with was considerable amount of backbleeding off of the uterus which was clamped and control the bleeding was difficult. Proceeded with clamping and double suture ligating the infundibulopelvic pelvic ligament on the left side as well. Window was made through the broad ligament to allow clamping and ligating the infundibulopelvic ligament on the right side. Uterine vessels on the right were skeletonized and clamped and suture ligated with 0 Vicryl. The bladder was further dissected off the anterior uterus and adhesions were taken down. Once the uterine vessels were ligated and hemostasis was assured the able to cross-clamp across the upper vagina and the uterine specimen was removed with scissors. 0 Vicryl suture was used to close the angles of the vagina and the vaginal cuff. Urine was closed over the cuff with 2-0 Vicryl good hemostasis was seen. Pelvis was irrigated. Proceeded with cystoscopy. Patient received IV carmine. Scope was used with video camera and use and saline. The bladder appeared normal and chips of dye were seen coming from both ureters. The and all packs were removed hemostasis was assured the pelvis was irrigated. Anterior peritoneum closed with running suture with 2-0 Vicryl. Fascia was closed with running suture with 0 Vicryl. Skin incision was irrigated good hemostasis was seen the skin was closed with a running subcuticular suture with 4-0 Vicryl. Sterile dressing was applied. Patient tolerated procedure and was stable in the procedure and brought to the PACU. She received 2 units of packed red blood cells during the procedure and 2 more units were ordered to be given PACU.  Adam Phenix, MD 05/14/2013 2:59 PM

## 2013-05-14 NOTE — Anesthesia Postprocedure Evaluation (Signed)
  Anesthesia Post-op Note  Patient: Katie Doyle  Procedure(s) Performed: Procedure(s): TOTAL ABDOMINAL HYSTERECTOMY (N/A) SALPINGO OOPHORECTOMY (Bilateral) CYSTO (N/A) Patient is awake and responsive. Pain and nausea are reasonably well controlled. Vital signs are stable and clinically acceptable. Pending Hb canceled by Dr Debroah Loop. Oxygen saturation is clinically acceptable. There are no apparent anesthetic complications at this time. Patient is ready for discharge.

## 2013-05-14 NOTE — Transfer of Care (Signed)
Immediate Anesthesia Transfer of Care Note  Patient: Katie Doyle  Procedure(s) Performed: Procedure(s): TOTAL ABDOMINAL HYSTERECTOMY (N/A) SALPINGO OOPHORECTOMY (Bilateral) CYSTO (N/A)  Patient Location: PACU  Anesthesia Type:General  Level of Consciousness: awake, alert , oriented and patient cooperative  Airway & Oxygen Therapy: Patient Spontanous Breathing and Patient connected to nasal cannula oxygen  Post-op Assessment: Report given to PACU RN and Post -op Vital signs reviewed and stable  Post vital signs: stable  Complications: No apparent anesthesia complications, labs pending

## 2013-05-14 NOTE — OR Nursing (Signed)
Pt blood pressure 163/101-178/98 assymptomatic. Reported to dr. Rodman Pickle. No orders received. Pt pre op blood pressure 155/90. Margarita Mail rn

## 2013-05-14 NOTE — Anesthesia Preprocedure Evaluation (Signed)
Anesthesia Evaluation  Patient identified by MRN, date of birth, ID band Patient awake    Reviewed: Allergy & Precautions, H&P , NPO status , Patient's Chart, lab work & pertinent test results, reviewed documented beta blocker date and time   History of Anesthesia Complications Negative for: history of anesthetic complications  Airway Mallampati: II TM Distance: >3 FB Neck ROM: full    Dental  (+) Teeth Intact   Pulmonary asthma (last inhaler use 2 months ago - rare use) , Current Smoker (3-4 cigs/day),  breath sounds clear to auscultation  Pulmonary exam normal       Cardiovascular Exercise Tolerance: Good hypertension (recently elevated BPs, never treated), Rhythm:regular Rate:Normal     Neuro/Psych Feels she may have restless legs, sometimes has loss of sensation in her hands negative psych ROS   GI/Hepatic negative GI ROS, (+)       marijuana use,   Endo/Other  BMI 35  Renal/GU negative Renal ROS  Female GU complaint     Musculoskeletal   Abdominal   Peds  Hematology  (+) anemia ,   Anesthesia Other Findings   Reproductive/Obstetrics negative OB ROS                           Anesthesia Physical Anesthesia Plan  ASA: II  Anesthesia Plan: General ETT   Post-op Pain Management:    Induction:   Airway Management Planned:   Additional Equipment:   Intra-op Plan:   Post-operative Plan:   Informed Consent: I have reviewed the patients History and Physical, chart, labs and discussed the procedure including the risks, benefits and alternatives for the proposed anesthesia with the patient or authorized representative who has indicated his/her understanding and acceptance.   Dental Advisory Given  Plan Discussed with: CRNA and Surgeon  Anesthesia Plan Comments:         Anesthesia Quick Evaluation

## 2013-05-14 NOTE — OR Nursing (Signed)
Dr. Debroah Loop aware of pt elevated blood pressures. Pt assymptomatic. No orders received to treat. Margarita Mail rn

## 2013-05-15 ENCOUNTER — Encounter (HOSPITAL_COMMUNITY): Payer: Self-pay | Admitting: Obstetrics & Gynecology

## 2013-05-15 LAB — CBC
MCH: 26.4 pg (ref 26.0–34.0)
MCHC: 32.9 g/dL (ref 30.0–36.0)
Platelets: 256 10*3/uL (ref 150–400)

## 2013-05-15 MED ORDER — IBUPROFEN 600 MG PO TABS
600.0000 mg | ORAL_TABLET | Freq: Four times a day (QID) | ORAL | Status: DC | PRN
  Administered 2013-05-15 – 2013-05-16 (×3): 600 mg via ORAL
  Filled 2013-05-15 (×4): qty 1

## 2013-05-15 MED ORDER — KETOROLAC TROMETHAMINE 30 MG/ML IJ SOLN: 30.0000 mg | Freq: Four times a day (QID) | INTRAMUSCULAR | Status: DC

## 2013-05-15 NOTE — Progress Notes (Signed)
Pts urinary catheter removed 0900, no void at 1300. Bladder scan completed, only 16ml. Encouraged pt to push fluids and restarted pts IVF at 125/hr. Will cont to  Monitor.

## 2013-05-15 NOTE — Progress Notes (Signed)
Changed PCA tubing due to leaking.

## 2013-05-15 NOTE — Progress Notes (Signed)
1 Day Post-Op Procedure(s) (LRB): TOTAL ABDOMINAL HYSTERECTOMY (N/A) SALPINGO OOPHORECTOMY (Bilateral) CYSTO (N/A)  Subjective: Patient reports nausea, vomiting, incisional pain and tolerating PO.  N&V improved, eating breakfast  Objective: I have reviewed patient's vital signs, intake and output, medications and labs.  General: alert, cooperative and no distress GI: ND, mildly tender, dsg dry, +BS  Assessment: s/p Procedure(s): TOTAL ABDOMINAL HYSTERECTOMY (N/A) SALPINGO OOPHORECTOMY (Bilateral) CYSTO (N/A): stable, progressing well and tolerating diet CBC    Component Value Date/Time   WBC 15.8* 05/14/2013 1745   RBC 3.96 05/14/2013 1745   HGB 10.4* 05/14/2013 1745   HCT 31.7* 05/14/2013 1745   PLT 273 05/14/2013 1745   MCV 80.1 05/14/2013 1745   MCH 26.3 05/14/2013 1745   MCHC 32.8 05/14/2013 1745   RDW 17.9* 05/14/2013 1745   LYMPHSABS 1.5 01/27/2013 0123   MONOABS 1.0 01/27/2013 0123   EOSABS 0.1 01/27/2013 0123   BASOSABS 0.0 01/27/2013 0123     Plan: Encourage ambulation Advance to PO medication  LOS: 1 day    ARNOLD,JAMES 05/15/2013, 8:19 AM

## 2013-05-15 NOTE — Progress Notes (Signed)
UR completed 

## 2013-05-15 NOTE — Anesthesia Postprocedure Evaluation (Signed)
  Anesthesia Post-op Note  Patient: Katie Doyle  Procedure(s) Performed: Procedure(s): TOTAL ABDOMINAL HYSTERECTOMY (N/A) SALPINGO OOPHORECTOMY (Bilateral) CYSTO (N/A)  Patient Location: [facility] unit  Anesthesia Type:General  Level of Consciousness: awake, alert  and oriented  Airway and Oxygen Therapy: Patient Spontanous Breathing and Patient connected to nasal cannula oxygen  Post-op Pain: none  Post-op Assessment: Post-op Vital signs reviewed and Patient's Cardiovascular Status Stable  Post-op Vital Signs: Reviewed and stable  Complications: No apparent anesthesia complications

## 2013-05-16 MED ORDER — KETOROLAC TROMETHAMINE 60 MG/2ML IM SOLN
60.0000 mg | Freq: Once | INTRAMUSCULAR | Status: DC
  Filled 2013-05-16: qty 2

## 2013-05-16 MED ORDER — DOCUSATE SODIUM 100 MG PO CAPS: 100.0000 mg | ORAL_CAPSULE | Freq: Two times a day (BID) | ORAL | Status: DC | PRN

## 2013-05-16 MED ORDER — INTEGRA F 125-1 MG PO CAPS: 1.0000 | ORAL_CAPSULE | Freq: Two times a day (BID) | ORAL | Status: DC

## 2013-05-16 MED ORDER — OXYCODONE-ACETAMINOPHEN 10-325 MG PO TABS: 1.0000 | ORAL_TABLET | ORAL | Status: DC | PRN

## 2013-05-16 MED ORDER — IBUPROFEN 800 MG PO TABS: 800.0000 mg | ORAL_TABLET | Freq: Three times a day (TID) | ORAL | Status: DC | PRN

## 2013-05-16 MED ORDER — BISACODYL 10 MG RE SUPP
10.0000 mg | Freq: Once | RECTAL | Status: AC
  Administered 2013-05-16: 10 mg via RECTAL
  Filled 2013-05-16: qty 1

## 2013-05-16 NOTE — Discharge Summary (Signed)
Physician Discharge Summary  Patient ID: Margeret Stachnik MRN: [id number] DOB/AGE: 08-23-1966 47 y.o.  Admit date: 05/14/2013 Discharge date: 05/16/2013  Admission Diagnoses: uterine fibroids  Discharge Diagnoses:  Active Problems:   * No active hospital problems. *   Discharged Condition: good  Hospital Course: pt was admitted and underwent a TAH (please see op note for details).  Had significant post op anemia and received a transfusion of 4 units of PRBC's.  Pt had an otherwise unremarkable post op course.  Today pt c/o continued pain not well controlled by po pain meds.  She   ambulated yesterday with no difficulty.  She has not passed flatus at present.  She is tolerating a regular diet with no N/V.   Consults: None  Significant Diagnostic Studies: labs: CBC  Treatments: IV hydration, surgery: TAH with cystoscopy and blood transfusion  Discharge Exam: Blood pressure 143/64, pulse 72, temperature 98.3 F (36.8 C), temperature source Oral, resp. rate 18, height 5' 5.5" (1.664 m), weight 211 lb (95.709 kg), SpO2 98.00%. General appearance: alert and mild distress Resp: clear to auscultation bilaterally Cardio: regular rate and rhythm, S1, S2 normal, no murmur, click, rub or gallop GI: soft, non-tender; bowel sounds normal; no masses,  no organomegaly Incision/Wound: dressing clean and dry  Disposition: 01-Home or Self Care  Discharge Orders   Future Orders Complete By Expires      Remove dressing in 72 hours  As directed     Call MD for:  difficulty breathing, headache or visual disturbances  As directed     Call MD for:  persistant dizziness or light-headedness  As directed     Call MD for:  persistant nausea and vomiting  As directed     Call MD for:  severe uncontrolled pain  As directed     Call MD for:  temperature >100.4  As directed     Diet - low sodium heart healthy  As directed     Driving Restrictions  As directed     Comments:      No driving while on pain  medications    Increase activity slowly  As directed     Lifting restrictions  As directed     Comments:      No lifting greater than 10# for 4 weeks    May shower / Bathe  As directed     Scheduling Instructions:      May shower.  No tub baths for 4 weeks    Sexual Activity Restrictions  As directed     Comments:      No intercourse for 6 weeks        Medication List    STOP taking these medications       ferrous sulfate 325 (65 FE) MG EC tablet     medroxyPROGESTERone 10 MG tablet  Commonly known as:  PROVERA     oxyCODONE-acetaminophen 5-325 MG per tablet  Commonly known as:  PERCOCET/ROXICET      TAKE these medications       albuterol 108 (90 BASE) MCG/ACT inhaler  Commonly known as:  PROVENTIL HFA;VENTOLIN HFA  Inhale 2 puffs into the lungs every 6 (six) hours as needed for wheezing.     docusate sodium 100 MG capsule  Commonly known as:  COLACE  Take 1 capsule (100 mg total) by mouth 2 (two) times daily as needed for constipation.     ibuprofen 800 MG tablet  Commonly known as:  ADVIL,MOTRIN  Take  1 tablet (800 mg total) by mouth every 8 (eight) hours as needed for pain.     INTEGRA F 125-1 MG Caps  Take 1 capsule by mouth 2 (two) times daily.     oxyCODONE-acetaminophen 10-325 MG per tablet  Commonly known as:  PERCOCET  Take 1 tablet by mouth every 4 (four) hours as needed for pain.           Follow-up Information   Follow up with ARNOLD,JAMES, MD In 2 weeks.   Contact information:   [address] [place] [address] [phone]     Pain meds adjusted today Toradol 60mg  x 1 now Increased Percocet to 10/325mg  upon discharge. Discharge to home after flatus F/u 2 weeks Dr. Darlina Sicilian bid upon discharge Reviewed discharge instructions.  All questions answered   Signed: HARRAWAY-SMITH, Delainy Mcelhiney 05/16/2013, 9:42 AM

## 2013-05-16 NOTE — Progress Notes (Signed)
Discharge instructions reviewed with patient.  Patient states understanding of home care, medications, activity, signs/symptoms to report to MD and follow up office visit.  No home equipment needed.  Patient discharged via wheelchair in stable condition with staff without incident.

## 2013-05-18 LAB — TYPE AND SCREEN
Antibody Screen: NEGATIVE
Unit division: 0
Unit division: 0

## 2013-05-21 ENCOUNTER — Telehealth: Payer: Self-pay | Admitting: General Practice

## 2013-05-21 DIAGNOSIS — G8918 Other acute postprocedural pain: Secondary | ICD-10-CM

## 2013-05-21 MED ORDER — TRAMADOL HCL 50 MG PO TABS: 50.0000 mg | ORAL_TABLET | Freq: Four times a day (QID) | ORAL | Status: DC | PRN

## 2013-05-21 NOTE — Telephone Encounter (Signed)
Patient called and left message stating she is calling about her pain medication prescription, she took her last pill this morning and needs a refill and would like a call back

## 2013-05-21 NOTE — Telephone Encounter (Signed)
Pt called back again for a 2nd time and left message stating she needs someone to call her back ASAP. Pt called back for a 3rd time at 1136 leaving a message stating can someone please call me back ASAP

## 2013-05-21 NOTE — Telephone Encounter (Signed)
Spoke to Dr Erin Fulling about patient's request for refill on percocet, stated she would prescribe the patient ultram 50 instead of percocet. Called patient back and told her I received her message from this morning about wanting a refill on her percocet and that the doctor did not want to refill that medication since she was given a large number at discharge but that we would prescribe her ultram instead and that Rx would go to [facility] on [address]. Patient verbalized understanding and had no further questions

## 2013-05-22 ENCOUNTER — Encounter (HOSPITAL_COMMUNITY): Payer: Self-pay | Admitting: *Deleted

## 2013-05-22 ENCOUNTER — Inpatient Hospital Stay (HOSPITAL_COMMUNITY)
Admission: AD | Admit: 2013-05-22 | Discharge: 2013-05-25 | DRG: 857 | Disposition: A | Discharge status: Home / Self Care | Payer: Self-pay | Source: Ambulatory Visit | Attending: Family Medicine | Admitting: Family Medicine

## 2013-05-22 ENCOUNTER — Observation Stay (HOSPITAL_COMMUNITY): Payer: Self-pay

## 2013-05-22 DIAGNOSIS — T8149XA Infection following a procedure, other surgical site, initial encounter: Secondary | ICD-10-CM

## 2013-05-22 DIAGNOSIS — T8140XA Infection following a procedure, unspecified, initial encounter: Principal | ICD-10-CM | POA: Diagnosis present

## 2013-05-22 DIAGNOSIS — Y838 Other surgical procedures as the cause of abnormal reaction of the patient, or of later complication, without mention of misadventure at the time of the procedure: Secondary | ICD-10-CM | POA: Diagnosis present

## 2013-05-22 DIAGNOSIS — T8131XS Disruption of external operation (surgical) wound, not elsewhere classified, sequela: Secondary | ICD-10-CM

## 2013-05-22 DIAGNOSIS — Z880 Allergy status to penicillin: Secondary | ICD-10-CM

## 2013-05-22 DIAGNOSIS — T8131XA Disruption of external operation (surgical) wound, not elsewhere classified, initial encounter: Secondary | ICD-10-CM | POA: Diagnosis present

## 2013-05-22 LAB — COMPREHENSIVE METABOLIC PANEL
ALT: 13 U/L (ref 0–35)
CO2: 29 mEq/L (ref 19–32)
Calcium: 8.8 mg/dL (ref 8.4–10.5)
Creatinine, Ser: 0.82 mg/dL (ref 0.50–1.10)
GFR calc Af Amer: >90 mL/min (ref >90)
GFR calc non Af Amer: 85 mL/min — ABNORMAL LOW (ref >90)
Glucose, Bld: 127 mg/dL — ABNORMAL HIGH (ref 70–99)
Total Bilirubin: 0.2 mg/dL — ABNORMAL LOW (ref 0.3–1.2)

## 2013-05-22 LAB — URINALYSIS, ROUTINE W REFLEX MICROSCOPIC
Bilirubin Urine: NEGATIVE
Glucose, UA: NEGATIVE mg/dL
Hgb urine dipstick: NEGATIVE
Ketones, ur: NEGATIVE mg/dL
Protein, ur: NEGATIVE mg/dL

## 2013-05-22 MED ORDER — ALBUTEROL SULFATE HFA 108 (90 BASE) MCG/ACT IN AERS
2.0000 | INHALATION_SPRAY | Freq: Four times a day (QID) | RESPIRATORY_TRACT | Status: DC | PRN
  Administered 2013-05-23: 2 via RESPIRATORY_TRACT
  Filled 2013-05-22: qty 6.7

## 2013-05-22 MED ORDER — OXYCODONE-ACETAMINOPHEN 5-325 MG PO TABS
2.0000 | ORAL_TABLET | Freq: Once | ORAL | Status: AC
  Administered 2013-05-22: 2 via ORAL
  Filled 2013-05-22: qty 2

## 2013-05-22 MED ORDER — TEMAZEPAM 15 MG PO CAPS: 15.0000 mg | ORAL_CAPSULE | Freq: Every evening | ORAL | Status: DC | PRN

## 2013-05-22 MED ORDER — PIPERACILLIN-TAZOBACTAM 3.375 G IVPB
3.3750 g | Freq: Three times a day (TID) | INTRAVENOUS | Status: DC
  Administered 2013-05-22 – 2013-05-25 (×9): 3.375 g via INTRAVENOUS
  Filled 2013-05-22 (×11): qty 50

## 2013-05-22 MED ORDER — CLINDAMYCIN PHOSPHATE 900 MG/50ML IV SOLN: 900.0000 mg | Freq: Three times a day (TID) | INTRAVENOUS | Status: DC

## 2013-05-22 MED ORDER — IBUPROFEN 600 MG PO TABS: 600.0000 mg | ORAL_TABLET | Freq: Four times a day (QID) | ORAL | Status: DC | PRN

## 2013-05-22 MED ORDER — DEXTROSE IN LACTATED RINGERS 5 % IV SOLN
INTRAVENOUS | Status: DC
  Administered 2013-05-22 – 2013-05-24 (×5): via INTRAVENOUS

## 2013-05-22 MED ORDER — IOHEXOL 300 MG/ML  SOLN
50.0000 mL | INTRAMUSCULAR | Status: AC
  Administered 2013-05-22: 50 mL via INTRAVENOUS

## 2013-05-22 MED ORDER — GUAIFENESIN 100 MG/5ML PO SOLN: 15.0000 mL | ORAL | Status: DC | PRN

## 2013-05-22 MED ORDER — ALUM & MAG HYDROXIDE-SIMETH 200-200-20 MG/5ML PO SUSP: 30.0000 mL | ORAL | Status: DC | PRN

## 2013-05-22 MED ORDER — IOHEXOL 300 MG/ML  SOLN
100.0000 mL | Freq: Once | INTRAMUSCULAR | Status: AC | PRN
  Administered 2013-05-22: 100 mL via INTRAVENOUS

## 2013-05-22 MED ORDER — MORPHINE SULFATE 4 MG/ML IJ SOLN
1.0000 mg | INTRAMUSCULAR | Status: DC | PRN
  Administered 2013-05-23 – 2013-05-25 (×2): 2 mg via INTRAVENOUS
  Filled 2013-05-22: qty 2
  Filled 2013-05-22 (×2): qty 1

## 2013-05-22 MED ORDER — OXYCODONE-ACETAMINOPHEN 5-325 MG PO TABS
1.0000 | ORAL_TABLET | ORAL | Status: DC | PRN
  Administered 2013-05-22 – 2013-05-23 (×3): 2 via ORAL
  Filled 2013-05-22 (×3): qty 2

## 2013-05-22 MED ORDER — MENTHOL 3 MG MT LOZG: 1.0000 | LOZENGE | OROMUCOSAL | Status: DC | PRN

## 2013-05-22 NOTE — MAU Provider Note (Signed)
History     CSN: [id number]  Arrival date and time: 05/22/13 1202   First Provider Initiated Contact with Patient 05/22/13 1340      Chief Complaint  Patient presents with  . Post-op Problem  . Nausea  . Abdominal Pain  . Vaginal Discharge   Abdominal Pain  Vaginal Discharge The patient's primary symptoms include a vaginal discharge. Associated symptoms include abdominal pain.   Katie Doyle is a 47 y/o AA female POD#8 for TAH and BSO for uterine fibroids. She is complaining of a painful "rock like spot" above her incision, watery vaginal discharge with itching, light pink vaginal spotting, and a productive cough. Rates incisional pain at 9/10; Percocet helps pain. Has not had her Tramadol prescription filled yet. Notes that she cannot see incision but it appears to be draining fluid. Reports that the cough began 2 days ago and causes her SOB when walking and laying down. She also notes substernal chest pain when she coughs; no radiation. Reports recent fevers but did not take her temperature. Pt describes nausea throughout post-op course; denies vomiting. Patient is a smoker.     Past Medical History  Diagnosis Date  . Fibroids   . Allergy   . SVD (spontaneous vaginal delivery) 1984    x 1  . Asthma     rarely uses inhaler  . Anemia     Past Surgical History  Procedure Laterality Date  . Appendectomy    . Cesarean section  1992    x 1  . Wisdom tooth extraction    . Abdominal hysterectomy N/A 05/14/2013    Procedure: TOTAL ABDOMINAL HYSTERECTOMY;  Surgeon: Adam Phenix, MD;  Location: [facility];  Service: Gynecology;  Laterality: N/A;  . Salpingoophorectomy Bilateral 05/14/2013    Procedure: SALPINGO OOPHORECTOMY;  Surgeon: Adam Phenix, MD;  Location: [facility];  Service: Gynecology;  Laterality: Bilateral;  . Cysto N/A 05/14/2013    Procedure: CYSTO;  Surgeon: Adam Phenix, MD;  Location: [facility];  Service: Gynecology;  Laterality: N/A;    Family History  Problem  Relation Age of Onset  . Diabetes Mother   . Hypertension Mother   . Cancer Father   . Diabetes Brother     History  Substance Use Topics  . Smoking status: Light Tobacco Smoker -- 0.25 packs/day for 4 years    Types: Cigarettes  . Smokeless tobacco: Never Used  . Alcohol Use: Yes     Comment: socially    Allergies:  Allergies  Allergen Reactions  . Penicillins Itching    Prescriptions prior to admission  Medication Sig Dispense Refill  . albuterol (PROVENTIL HFA;VENTOLIN HFA) 108 (90 BASE) MCG/ACT inhaler Inhale 2 puffs into the lungs every 6 (six) hours as needed for wheezing.      . Fe Fum-FePoly-FA-Vit C-Vit B3 (INTEGRA F) 125-1 MG CAPS Take 1 capsule by mouth 2 (two) times daily.  60 capsule  1  . ibuprofen (ADVIL,MOTRIN) 800 MG tablet Take 1 tablet (800 mg total) by mouth every 8 (eight) hours as needed for pain.  30 tablet  0  . oxyCODONE-acetaminophen (PERCOCET) 10-325 MG per tablet Take 1 tablet by mouth every 4 (four) hours as needed for pain.  30 tablet  0  . traMADol (ULTRAM) 50 MG tablet Take 1 tablet (50 mg total) by mouth every 6 (six) hours as needed for pain.  30 tablet  0  . [DISCONTINUED] docusate sodium (COLACE) 100 MG capsule Take 1 capsule (100 mg  total) by mouth 2 (two) times daily as needed for constipation.  30 capsule  2    Review of Systems  Gastrointestinal: Positive for abdominal pain.  Genitourinary: Positive for vaginal discharge.   Physical Exam   Blood pressure 134/80, pulse 91, temperature 98.7 F (37.1 C), temperature source Oral, resp. rate 20, height 5' 4.5" (1.638 m), weight 88.361 kg (194 lb 12.8 oz), last menstrual period 01/19/2013.   Physical Exam  Constitutional: She appears well-developed and well-nourished.  Cardiovascular: Normal rate, regular rhythm and normal heart sounds.   Respiratory: Breath sounds normal. No respiratory distress. She has no wheezes. She has no rales. She exhibits no tenderness.  GI: There is tenderness.  There is no rigidity, no rebound and no guarding.  Tenderness at site of incision  Musculoskeletal: She exhibits no edema.  Skin: No rash noted. There is erythema.  Erythema and edema superior to abdominal incision. Watery and bloody drainage is apparent at incision. Drainage soaked through multiple pads during MAU stay. Lower abdomen is tender to palpation. Dr. Shawnie Pons assessed patient in MAU.  MAU Course  Procedures  MDM Percocet given for pain management.  Assessment and Plan  A: Post-operative infection to low transverse abdominal incision from TAH-BSO.  P: Admit to 3rd floor for IV Antibiotics.  Katie Doyle 05/22/2013, 1:56 PM   I have seen and evaluated the patient with the PA student. I agree with the assessment and plan as written above.   Freddi Starr, PA-C  05/22/2013 5:02 PM

## 2013-05-22 NOTE — Progress Notes (Signed)
Pt to be admitted to room 302 after IV start and gown and linen changed.  Report called to Raynelle Fanning, [place]

## 2013-05-22 NOTE — MAU Note (Signed)
Patient states she had an abdominal hysterectomy on 5-29 done by Dr. Debroah Loop. States she started feeling bad two days ago, nausea and abdominal pain. States she has a watery vaginal discharge with itching.

## 2013-05-22 NOTE — MAU Note (Signed)
Dr Shawnie Pons to come assess pt.  Serousanginous fluid noted from incision site.  New pad placed at incision site and sheet changed.

## 2013-05-22 NOTE — Progress Notes (Signed)
ANTIBIOTIC CONSULT NOTE - INITIAL  Pharmacy Consult for Zosyn Indication: Post-Op Wound Infection  Allergies  Allergen Reactions  . Penicillins Itching    Patient Measurements: Height: 5' 4.5" (163.8 cm) Weight: 194 lb 12.8 oz (88.361 kg) IBW/kg (Calculated) : 55.85   Vital Signs: Temp: 98.1 F (36.7 C) (06/06 1616) Temp src: Oral (06/06 1616) BP: 130/67 mmHg (06/06 1616) Pulse Rate: 83 (06/06 1616)      Labs: No results found for this basename: WBC, HGB, PLT, LABCREA, CREATININE,  in the last 72 hours Estimated Creatinine Clearance: 87.8 ml/min (by C-G formula based on Cr of 0.88).  Microbiology: Recent Results (from the past 720 hour(s))  SURGICAL PCR SCREEN     Status: Abnormal   Collection Time    05/07/13  8:50 AM      Result Value Range Status   MRSA, PCR NEGATIVE  NEGATIVE Final   Staphylococcus aureus POSITIVE (*) NEGATIVE Final   Comment:            The Xpert SA Assay (FDA     approved for NASAL specimens     in patients over 23 years of age),     is one component of     a comprehensive surveillance     program.  Test performance has     been validated by [employer] for patients greater     than or equal to 42 year old.     It is not intended     to diagnose infection nor to     guide or monitor treatment.    Medical History: Past Medical History  Diagnosis Date  . Fibroids   . Allergy   . SVD (spontaneous vaginal delivery) 1984    x 1  . Asthma     rarely uses inhaler  . Anemia     Medications:  Assessment: 47yo F re-admitted s/p TAH/SBO 8days ago. Experiencing copious wound drainage and incisional pain x 2 days. Pt's pre-op eval revealed MRSA (-) and staph (+). Pt has Penicillin allergy with reaction =itching. Discussed with Dr. Shawnie Pons and will instruct RN to watch for itching which can be treated with Benadryl.   Goal of Therapy:    Plan:  1. Zosyn 3.375 gram IV q8h. Infuse over 4 hours. 2. Will continue to follow. May want to  consider adding Vancomycin to regimen if no improvement shown on Zosyn. 3. Consider alternate therapy if pt has intolerable itching d/t allergy to Penicillin. Claybon Jabs 05/22/2013,5:10 PM

## 2013-05-22 NOTE — H&P (Signed)
Katie Doyle is an 47 y.o. [phone] Unknown female.   Chief Complaint: Toma Deiters HPI: 47 y.o. [id number] who is 8 days post op from a TAHBSO/cystoscopy with a substantial blood loss who presents with 2 day h/o leaking copious amount of sero-sanguinous material from incision and increasing incisional pain. Denies fever/ chills.  Past Medical History  Diagnosis Date  . Fibroids   . Allergy   . SVD (spontaneous vaginal delivery) 1984    x 1  . Asthma     rarely uses inhaler  . Anemia     Past Surgical History  Procedure Laterality Date  . Appendectomy    . Cesarean section  1992    x 1  . Wisdom tooth extraction    . Abdominal hysterectomy N/A 05/14/2013    Procedure: TOTAL ABDOMINAL HYSTERECTOMY;  Surgeon: Adam Phenix, MD;  Location: [facility];  Service: Gynecology;  Laterality: N/A;  . Salpingoophorectomy Bilateral 05/14/2013    Procedure: SALPINGO OOPHORECTOMY;  Surgeon: Adam Phenix, MD;  Location: [facility];  Service: Gynecology;  Laterality: Bilateral;  . Cysto N/A 05/14/2013    Procedure: CYSTO;  Surgeon: Adam Phenix, MD;  Location: [facility];  Service: Gynecology;  Laterality: N/A;    Family History  Problem Relation Age of Onset  . Diabetes Mother   . Hypertension Mother   . Cancer Father   . Diabetes Brother    Social History:  reports that she has been smoking Cigarettes.  She has a 1 pack-year smoking history. She has never used smokeless tobacco. She reports that  drinks alcohol. She reports that she uses illicit drugs (Marijuana).  Allergies:  Allergies  Allergen Reactions  . Penicillins Itching    Medications Prior to Admission  Medication Sig Dispense Refill  . albuterol (PROVENTIL HFA;VENTOLIN HFA) 108 (90 BASE) MCG/ACT inhaler Inhale 2 puffs into the lungs every 6 (six) hours as needed for wheezing.      . Fe Fum-FePoly-FA-Vit C-Vit B3 (INTEGRA F) 125-1 MG CAPS Take 1 capsule by mouth 2 (two) times daily.  60 capsule  1  . ibuprofen (ADVIL,MOTRIN) 800 MG tablet  Take 1 tablet (800 mg total) by mouth every 8 (eight) hours as needed for pain.  30 tablet  0  . oxyCODONE-acetaminophen (PERCOCET) 10-325 MG per tablet Take 1 tablet by mouth every 4 (four) hours as needed for pain.  30 tablet  0  . traMADol (ULTRAM) 50 MG tablet Take 1 tablet (50 mg total) by mouth every 6 (six) hours as needed for pain.  30 tablet  0  . [DISCONTINUED] docusate sodium (COLACE) 100 MG capsule Take 1 capsule (100 mg total) by mouth 2 (two) times daily as needed for constipation.  30 capsule  2     Pertinent items are noted in HPI.  Blood pressure 134/80, pulse 91, temperature 98.7 F (37.1 C), temperature source Oral, resp. rate 20, height 5' 4.5" (1.638 m), weight 194 lb 12.8 oz (88.361 kg), last menstrual period 01/19/2013. BP 134/80  Pulse 91  Temp(Src) 98.7 F (37.1 C) (Oral)  Resp 20  Ht 5' 4.5" (1.638 m)  Wt 194 lb 12.8 oz (88.361 kg)  BMI 32.93 kg/m2  LMP 01/19/2013 General appearance: alert, cooperative and appears stated age Head: Normocephalic, without obvious abnormality, atraumatic Neck: supple, symmetrical, trachea midline Lungs: clear to auscultation bilaterally Heart: regular rate and rhythm, S1, S2 normal, no murmur, click, rub or gallop Abdomen: normal findings: soft and abnormal findings:  tender Extremities: extremities normal, atraumatic,  no cyanosis or edema Pulses: 2+ and symmetric Skin: erythema and edema along incision, copious amount of serosanguinous drainage.  There is no opening large enough in incision to probe. Lymph nodes: Cervical, supraclavicular, and axillary nodes normal.   Lab Results  Component Value Date   WBC 10.6* 05/15/2013   HGB 9.2* 05/15/2013   HCT 28.0* 05/15/2013   MCV 80.5 05/15/2013   PLT 256 05/15/2013   Lab Results  Component Value Date   PREGTESTUR NEGATIVE 05/14/2013     Assessment/Plan Patient Active Problem List   Diagnosis Date Noted  . Postoperative wound infection 05/22/2013  . Uterine fibroid 28 wks  01/28/2013  . Anemia, iron deficiency 01/28/2013  . Leiomyoma of uterus, unspecified 01/19/2013  . Anemia 01/19/2013   Admit for broad spectrum antibiotics.  If no significant improvement consider OR for debridement-exploration.  Lakeria Starkman S 05/22/2013, 3:15 PM

## 2013-05-22 NOTE — MAU Provider Note (Signed)
Chart reviewed and agree with management and plan.  

## 2013-05-22 NOTE — MAU Note (Signed)
Pt with phelgm and continuous cough and pain s/p abdominal hysterctomy on 05/14/13.  Pt with light pink spotting discharge this am.  Pain 9 on 0-10 scale with hard "like rock" spot above my incision. Pad placed to above incision to relieve pressure

## 2013-05-22 NOTE — MAU Note (Signed)
Raynelle Fanning, PA in to see pt.

## 2013-05-23 ENCOUNTER — Encounter (HOSPITAL_COMMUNITY): Payer: Self-pay | Admitting: Anesthesiology

## 2013-05-23 ENCOUNTER — Encounter (HOSPITAL_COMMUNITY)
Admission: AD | Disposition: A | Discharge status: Home / Self Care | Payer: Self-pay | Source: Ambulatory Visit | Attending: Family Medicine

## 2013-05-23 ENCOUNTER — Observation Stay (HOSPITAL_COMMUNITY): Payer: Self-pay | Admitting: Anesthesiology

## 2013-05-23 DIAGNOSIS — T8140XA Infection following a procedure, unspecified, initial encounter: Secondary | ICD-10-CM

## 2013-05-23 DIAGNOSIS — T8131XA Disruption of external operation (surgical) wound, not elsewhere classified, initial encounter: Secondary | ICD-10-CM

## 2013-05-23 DIAGNOSIS — Z880 Allergy status to penicillin: Secondary | ICD-10-CM

## 2013-05-23 HISTORY — PX: INCISION AND DRAINAGE ABSCESS: SHX5864

## 2013-05-23 LAB — CBC WITH DIFFERENTIAL/PLATELET
Basophils Absolute: 0 10*3/uL (ref 0.0–0.1)
Eosinophils Absolute: 0.4 10*3/uL (ref 0.0–0.7)
Eosinophils Relative: 6 % — ABNORMAL HIGH (ref 0–5)
Lymphocytes Relative: 24 % (ref 12–46)
Lymphs Abs: 1.5 10*3/uL (ref 0.7–4.0)
MCH: 25.9 pg — ABNORMAL LOW (ref 26.0–34.0)
MCV: 83 fL (ref 78.0–100.0)
Neutrophils Relative %: 61 % (ref 43–77)
Platelets: 370 10*3/uL (ref 150–400)
RBC: 3.17 MIL/uL — ABNORMAL LOW (ref 3.87–5.11)
RDW: 18.2 % — ABNORMAL HIGH (ref 11.5–15.5)
WBC: 6.2 10*3/uL (ref 4.0–10.5)

## 2013-05-23 LAB — TYPE AND SCREEN
ABO/RH(D): O POS
Antibody Screen: NEGATIVE

## 2013-05-23 SURGERY — INCISION AND DRAINAGE, ABSCESS
Anesthesia: General | Site: Abdomen | Wound class: Clean Contaminated

## 2013-05-23 MED ORDER — BUPIVACAINE HCL 0.25 % IJ SOLN
INTRAMUSCULAR | Status: DC | PRN
  Administered 2013-05-23: 30 mL

## 2013-05-23 MED ORDER — HYDROMORPHONE HCL PF 1 MG/ML IJ SOLN
INTRAMUSCULAR | Status: AC
  Filled 2013-05-23: qty 1

## 2013-05-23 MED ORDER — ONDANSETRON HCL 4 MG/2ML IJ SOLN
INTRAMUSCULAR | Status: DC | PRN
  Administered 2013-05-23: 4 mg via INTRAVENOUS

## 2013-05-23 MED ORDER — NEOSTIGMINE METHYLSULFATE 1 MG/ML IJ SOLN
INTRAMUSCULAR | Status: DC | PRN
  Administered 2013-05-23: 2 mg via INTRAVENOUS

## 2013-05-23 MED ORDER — KETOROLAC TROMETHAMINE 30 MG/ML IJ SOLN
30.0000 mg | Freq: Four times a day (QID) | INTRAMUSCULAR | Status: AC
  Administered 2013-05-23 – 2013-05-24 (×5): 30 mg via INTRAVENOUS
  Filled 2013-05-23 (×5): qty 1

## 2013-05-23 MED ORDER — DEXAMETHASONE SODIUM PHOSPHATE 4 MG/ML IJ SOLN
INTRAMUSCULAR | Status: DC | PRN
  Administered 2013-05-23: 10 mg via INTRAVENOUS

## 2013-05-23 MED ORDER — ONDANSETRON HCL 4 MG PO TABS: 4.0000 mg | ORAL_TABLET | Freq: Four times a day (QID) | ORAL | Status: DC | PRN

## 2013-05-23 MED ORDER — MEPERIDINE HCL 25 MG/ML IJ SOLN: 6.2500 mg | INTRAMUSCULAR | Status: DC | PRN

## 2013-05-23 MED ORDER — MIDAZOLAM HCL 5 MG/5ML IJ SOLN
INTRAMUSCULAR | Status: DC | PRN
  Administered 2013-05-23: 2 mg via INTRAVENOUS

## 2013-05-23 MED ORDER — ROCURONIUM BROMIDE 100 MG/10ML IV SOLN
INTRAVENOUS | Status: DC | PRN
  Administered 2013-05-23: 40 mg via INTRAVENOUS

## 2013-05-23 MED ORDER — GLYCOPYRROLATE 0.2 MG/ML IJ SOLN
INTRAMUSCULAR | Status: DC | PRN
  Administered 2013-05-23: 0.1 mg via INTRAVENOUS
  Administered 2013-05-23: 0.2 mg via INTRAVENOUS
  Administered 2013-05-23: 0.3 mg via INTRAVENOUS

## 2013-05-23 MED ORDER — PROPOFOL 10 MG/ML IV BOLUS
INTRAVENOUS | Status: DC | PRN
  Administered 2013-05-23: 150 mg via INTRAVENOUS
  Administered 2013-05-23: 50 mg via INTRAVENOUS

## 2013-05-23 MED ORDER — HYDROMORPHONE HCL PF 1 MG/ML IJ SOLN
INTRAMUSCULAR | Status: AC
  Administered 2013-05-23: 0.5 mg via INTRAVENOUS
  Filled 2013-05-23: qty 1

## 2013-05-23 MED ORDER — FENTANYL CITRATE 0.05 MG/ML IJ SOLN
INTRAMUSCULAR | Status: DC | PRN
  Administered 2013-05-23 (×3): 50 ug via INTRAVENOUS
  Administered 2013-05-23: 100 ug via INTRAVENOUS
  Administered 2013-05-23: 50 ug via INTRAVENOUS

## 2013-05-23 MED ORDER — KETOROLAC TROMETHAMINE 30 MG/ML IJ SOLN: 30.0000 mg | Freq: Four times a day (QID) | INTRAMUSCULAR | Status: DC

## 2013-05-23 MED ORDER — LIDOCAINE HCL (CARDIAC) 20 MG/ML IV SOLN
INTRAVENOUS | Status: DC | PRN
  Administered 2013-05-23: 100 mg via INTRAVENOUS

## 2013-05-23 MED ORDER — OXYCODONE-ACETAMINOPHEN 5-325 MG PO TABS
1.0000 | ORAL_TABLET | ORAL | Status: DC | PRN
  Administered 2013-05-23 – 2013-05-25 (×8): 2 via ORAL
  Filled 2013-05-23 (×9): qty 2

## 2013-05-23 MED ORDER — PROMETHAZINE HCL 25 MG/ML IJ SOLN: 6.2500 mg | INTRAMUSCULAR | Status: DC | PRN

## 2013-05-23 MED ORDER — HYDROMORPHONE HCL PF 1 MG/ML IJ SOLN
0.2500 mg | INTRAMUSCULAR | Status: DC | PRN
  Administered 2013-05-23 (×2): 0.5 mg via INTRAVENOUS

## 2013-05-23 MED ORDER — LACTATED RINGERS IV SOLN
INTRAVENOUS | Status: DC | PRN
  Administered 2013-05-23 (×3): via INTRAVENOUS

## 2013-05-23 MED ORDER — KETOROLAC TROMETHAMINE 30 MG/ML IJ SOLN: 15.0000 mg | Freq: Once | INTRAMUSCULAR | Status: DC | PRN

## 2013-05-23 MED ORDER — ONDANSETRON HCL 4 MG/2ML IJ SOLN: 4.0000 mg | Freq: Four times a day (QID) | INTRAMUSCULAR | Status: DC | PRN

## 2013-05-23 SURGICAL SUPPLY — 35 items
CANISTER SUCTION 2500CC (MISCELLANEOUS) ×3 IMPLANT
CLOTH BEACON ORANGE TIMEOUT ST (SAFETY) ×2 IMPLANT
DECANTER SPIKE VIAL GLASS SM (MISCELLANEOUS) ×1 IMPLANT
DRESSING TELFA 8X3 (GAUZE/BANDAGES/DRESSINGS) ×2 IMPLANT
DRSG OPSITE POSTOP 4X12 (GAUZE/BANDAGES/DRESSINGS) ×3 IMPLANT
ELECT REM PT RETURN 9FT ADLT (ELECTROSURGICAL) ×2
ELECTRODE REM PT RTRN 9FT ADLT (ELECTROSURGICAL) IMPLANT
GAUZE SPONGE 4X4 16PLY XRAY LF (GAUZE/BANDAGES/DRESSINGS) ×1 IMPLANT
GLOVE BIO SURGEON STRL SZ 6.5 (GLOVE) ×1 IMPLANT
GLOVE BIOGEL PI IND STRL 7.0 (GLOVE) IMPLANT
GLOVE BIOGEL PI INDICATOR 7.0 (GLOVE) ×1
GLOVE SURG SS PI 6.5 STRL IVOR (GLOVE) ×6 IMPLANT
GLOVE SURG SS PI 7.0 STRL IVOR (GLOVE) ×6 IMPLANT
GOWN PREVENTION PLUS LG XLONG (DISPOSABLE) ×4 IMPLANT
GOWN STRL REIN XL XLG (GOWN DISPOSABLE) ×1 IMPLANT
NDL HYPO 25X1 1.5 SAFETY (NEEDLE) IMPLANT
NEEDLE HYPO 25X1 1.5 SAFETY (NEEDLE) ×2 IMPLANT
NS IRRIG 1000ML POUR BTL (IV SOLUTION) ×3 IMPLANT
PACK ABDOMINAL GYN (CUSTOM PROCEDURE TRAY) ×2 IMPLANT
PAD ABD 7.5X8 STRL (GAUZE/BANDAGES/DRESSINGS) ×2 IMPLANT
PAD OB MATERNITY 4.3X12.25 (PERSONAL CARE ITEMS) ×3 IMPLANT
PAD PREP 24X48 CUFFED NSTRL (MISCELLANEOUS) ×2 IMPLANT
PROTECTOR NERVE ULNAR (MISCELLANEOUS) ×1 IMPLANT
SLEEVE SCD COMPRESS KNEE MED (MISCELLANEOUS) ×1 IMPLANT
SPONGE GAUZE 4X4 12PLY (GAUZE/BANDAGES/DRESSINGS) ×1 IMPLANT
SPONGE LAP 18X18 X RAY DECT (DISPOSABLE) ×1 IMPLANT
SUT VIC AB 0 CT1 18XCR BRD8 (SUTURE) IMPLANT
SUT VIC AB 0 CT1 36 (SUTURE) ×1 IMPLANT
SUT VIC AB 0 CT1 8-18 (SUTURE) ×4
SYR BULB IRRIGATION 50ML (SYRINGE) ×1 IMPLANT
SYR CONTROL 10ML LL (SYRINGE) ×1 IMPLANT
TAPE CLOTH SURG 4X10 WHT LF (GAUZE/BANDAGES/DRESSINGS) ×1 IMPLANT
TOWEL OR 17X24 6PK STRL BLUE (TOWEL DISPOSABLE) ×5 IMPLANT
TRAY FOLEY CATH 14FR (SET/KITS/TRAYS/PACK) ×2 IMPLANT
WATER STERILE IRR 1000ML POUR (IV SOLUTION) ×2 IMPLANT

## 2013-05-23 NOTE — Plan of Care (Signed)
Problem: Phase I Progression Outcomes Goal: Pain controlled with appropriate interventions Outcome: Completed/Met Date Met:  05/23/13 Good pain control with po Percocet. Goal: OOB as tolerated unless otherwise ordered Outcome: Completed/Met Date Met:  05/23/13 Tolerates up to bathroom well Goal: Initial discharge plan identified Outcome: Completed/Met Date Met:  05/23/13 Pain controlled  VSS F/U understood and when to call MD Goal: Voiding-avoid urinary catheter unless indicated Outcome: Completed/Met Date Met:  05/23/13 Voids qs amts of yellow urine Goal: Other Phase I Outcomes/Goals Outcome: Completed/Met Date Met:  05/23/13 Has been down and completed her CT scan

## 2013-05-23 NOTE — Op Note (Addendum)
Procedure: Exploration of abdominal surgical wound, debridement and irrigation and repair of fascial dehiscence  Preoperative diagnosis: Postoperative wound infection Postoperative diagnosis: Postoperative wound infection with fascial dehiscence Surgeon: Dr. Scheryl Darter Anesthesia: Gen. endotracheal Specimens: None Estimated blood loss 20 mL Drain: Foley catheter Complications: None  Counts: Correct  Patient gave written consent for incision and drainage of her surgical abdominal wound 8 days after a total bowel hysterectomy with removal of a large fibroid uterus. Patient identification was confirmed and she was brought to the OR and general anesthesia was induced. His placed in dorsal supine position. Abdomen was sterilely prepped and draped and a Foley catheter was placed. There was purulent drainage from a small defect in the transverse lower abdominal incision. She has some induration of the pannus above the incision. I probed the defect and clips the subcuticular suture and the wound was opened. There were areas of necrotic fat was also evidence of good blood supply in most of the incision.  There appeared to have been a fascial dehiscence. The underlying  muscle was intact and there was no sign of a defect into the peritoneum. Necrotic tissue was debrided sharply. 0 Vicryl suture was used to close the fascia with interrupted figure-of-eight's. The wound was irrigated. The 4 x 4's were packed into the wound with saline wet to dry. Sterile dressing was applied. She tolerated the procedure well without complications and was brought in stable condition to PACU.  Dr. Scheryl Darter 05/23/2013 1:25 PM

## 2013-05-23 NOTE — Anesthesia Procedure Notes (Signed)
Procedure Name: Intubation Date/Time: 05/23/2013 12:21 PM Performed by: [employer], Jannet Askew Pre-anesthesia Checklist: Patient identified, Patient being monitored, Emergency Drugs available, Timeout performed and Suction available Patient Re-evaluated:Patient Re-evaluated prior to inductionOxygen Delivery Method: Circle system utilized Intubation Type: IV induction Ventilation: Mask ventilation without difficulty Laryngoscope Size: Mac and 3 Grade View: Grade I Tube type: Oral Tube size: 7.0 mm Number of attempts: 1 Placement Confirmation: ETT inserted through vocal cords under direct vision,  positive ETCO2 and breath sounds checked- equal and bilateral Secured at: 21 cm Dental Injury: Teeth and Oropharynx as per pre-operative assessment

## 2013-05-23 NOTE — Anesthesia Postprocedure Evaluation (Signed)
Anesthesia Post Note  Patient: Katie Doyle  Procedure(s) Performed: Procedure(s) (LRB): INCISION AND DRAINAGE OF ABDOMINAL ABSCESS (N/A)  Anesthesia type: General  Patient location: PACU  Post pain: Pain level controlled  Post assessment: Post-op Vital signs reviewed  Last Vitals:  Filed Vitals:   05/23/13 1400  BP: 155/92  Pulse: 70  Temp:   Resp: 20    Post vital signs: Reviewed  Level of consciousness: sedated  Complications: No apparent anesthesia complications

## 2013-05-23 NOTE — Transfer of Care (Signed)
Immediate Anesthesia Transfer of Care Note  Patient: Katie Doyle  Procedure(s) Performed: Procedure(s): INCISION AND DRAINAGE OF ABDOMINAL ABSCESS (N/A)  Patient Location: PACU  Anesthesia Type:General  Level of Consciousness: awake  Airway & Oxygen Therapy: Patient Spontanous Breathing and Patient connected to nasal cannula oxygen  Post-op Assessment: Report given to PACU RN and Post -op Vital signs reviewed and stable  Post vital signs: Reviewed and stable  Complications: No apparent anesthesia complications

## 2013-05-23 NOTE — Anesthesia Preprocedure Evaluation (Signed)
Anesthesia Evaluation  Patient identified by MRN, date of birth, ID band Patient awake    Reviewed: Allergy & Precautions, H&P , NPO status , Patient's Chart, lab work & pertinent test results, reviewed documented beta blocker date and time   History of Anesthesia Complications Negative for: history of anesthetic complications  Airway Mallampati: II TM Distance: >3 FB Neck ROM: full    Dental  (+) Teeth Intact   Pulmonary asthma (last inhaler use 2 months ago - rare use) , Current Smoker (3-4 cigs/day),  breath sounds clear to auscultation  Pulmonary exam normal       Cardiovascular Exercise Tolerance: Good hypertension (recently elevated BPs, never treated), Rhythm:regular Rate:Normal     Neuro/Psych Feels she may have restless legs, sometimes has loss of sensation in her hands negative psych ROS   GI/Hepatic negative GI ROS,   Endo/Other  BMI 35  Renal/GU negative Renal ROS  Female GU complaint     Musculoskeletal   Abdominal   Peds  Hematology   Anesthesia Other Findings   Reproductive/Obstetrics negative OB ROS                           Anesthesia Physical  Anesthesia Plan  ASA: II  Anesthesia Plan: General ETT   Post-op Pain Management:    Induction: Intravenous  Airway Management Planned: Oral ETT  Additional Equipment:   Intra-op Plan:   Post-operative Plan: Extubation in OR  Informed Consent: I have reviewed the patients History and Physical, chart, labs and discussed the procedure including the risks, benefits and alternatives for the proposed anesthesia with the patient or authorized representative who has indicated his/her understanding and acceptance.   Dental Advisory Given  Plan Discussed with: CRNA and Surgeon  Anesthesia Plan Comments:         Anesthesia Quick Evaluation

## 2013-05-23 NOTE — Progress Notes (Signed)
Subjective: Patient reports tolerating PO and no problems voiding.  Decreased pain and pressure since admission.  Has been NPO overnight.   Objective: I have reviewed patient's vital signs, intake and output, medications, labs and radiology results.  General: alert and mild distress GI: normal findings: bowel sounds normal, abnormal findings:  guarding and incision: indurated and erythematous.  Very tneder.  incision intact.  Could not probe incision due to pain. . CBC    Component Value Date/Time   WBC 10.6* 05/15/2013 0926   RBC 3.48* 05/15/2013 0926   HGB 9.2* 05/15/2013 0926   HCT 28.0* 05/15/2013 0926   PLT 256 05/15/2013 0926   MCV 80.5 05/15/2013 0926   MCH 26.4 05/15/2013 0926   MCHC 32.9 05/15/2013 0926   RDW 18.1* 05/15/2013 0926   LYMPHSABS 1.5 01/27/2013 0123   MONOABS 1.0 01/27/2013 0123   EOSABS 0.1 01/27/2013 0123   BASOSABS 0.0 01/27/2013 0123   CBC from 05/22/2013 not found  05/22/2013 Clinical Data: Postoperative wound infection. Evaluate for  potential abscess. 8 days postop following TAH/BSO.  CT ABDOMEN AND PELVIS WITH CONTRAST  Technique: Multidetector CT imaging of the abdomen and pelvis was  performed following the standard protocol during bolus  administration of intravenous contrast.  Contrast: OMNIPAQUE IOHEXOL 300 MG/ML SOLN  Comparison: No priors.  Findings:  Lung Bases: Unremarkable.  Abdomen/Pelvis: The appearance of the liver, gallbladder,  pancreas, spleen, bilateral adrenal glands and bilateral kidneys is  unremarkable. Status post TAH/BSO. No significant volume of  ascites. There is a trace volume of pneumoperitoneum, which is not  unexpected in this recent postoperative patient. No large volume  of pneumoperitoneum is present. No pathologic distension of small  bowel. No definite pathologic lymphadenopathy identified within  the abdomen or pelvis. Numerous pelvic phleboliths. Urinary  bladder is unremarkable in appearance. Tiny umbilical hernia   containing only omental fat incidentally noted.  Musculoskeletal: There is a large collection of low attenuation  material (likely fluid) in the subcutaneous fat of the lower  anterior abdominal wall adjacent to the incision for the recent  TAH/BSO. This measures approximately 18.6 x 4.3 x 2.7 cm. There is  some rim enhancement of this collection and several small locules  of gas. There are no aggressive appearing lytic or blastic lesions  noted in the visualized portions of the skeleton.  IMPRESSION:  1. Large collection of complex fluid adjacent to the incision for  the recent TAH/BSO in the lower anterior abdominal wall, as above.  Given the presence of rim enhancement and small locules of gas  associated with this collection, the possibility of an abscess is  not excluded, however, the appearance is nonspecific.  2. Trace volume of pneumoperitoneum is within normal limits given  the recent surgical history.  3. No other potential acute findings within the abdomen or pelvis.   Assessment/Plan: Post op wound abscess Plan I&D under anesthesia later today CBC with diff T&S Keep zosyn   LOS: 1 day    Doyle, Katie Macinnes 05/23/2013, 7:13 AM

## 2013-05-24 NOTE — Plan of Care (Signed)
Problem: Phase II Progression Outcomes Goal: Discharge plan established Outcome: Completed/Met Date Met:  05/24/13 Pain control Home health for wound care Understands f/u care and when to call the MD VSS  Problem: Discharge Progression Outcomes Goal: Pain controlled with appropriate interventions Outcome: Completed/Met Date Met:  05/24/13 Good pain control with po Percocet Goal: Tolerating diet Outcome: Completed/Met Date Met:  05/24/13 Tolerates  Regular diet well Goal: Activity appropriate for discharge plan Outcome: Completed/Met Date Met:  05/24/13 Ambulates in the hall well

## 2013-05-24 NOTE — Progress Notes (Signed)
1 Day Post-Op Procedure(s) (LRB): INCISION AND DRAINAGE OF ABDOMINAL ABSCESS (N/A) Repair of dehiscence  Subjective: Patient reports incisional pain, tolerating PO and no problems voiding.    Objective: I have reviewed patient's vital signs, intake and output, medications and labs.  General: alert, cooperative and mild distress GI: soft, non-tender; bowel sounds normal; no masses,  no organomegaly and incision: serous drainage present and packing in place  Assessment: s/p Procedure(s): INCISION AND DRAINAGE OF ABDOMINAL ABSCESS (N/A): progressing well  Plan: Encourage ambulation Wound care nurse to see. Dressing change Qshift  LOS: 2 days    Kavitha Lansdale 05/24/2013, 7:30 AM

## 2013-05-24 NOTE — Progress Notes (Signed)
Case Management, Rosalene Billings, contacted regarding home health services for wound management.  Face sheet with MD contact information and copy of order to be faxed to [phone] once wound management orders final.  Osvaldo Angst, RN------------------

## 2013-05-24 NOTE — Anesthesia Postprocedure Evaluation (Signed)
Anesthesia Post Note  Patient: Katie Doyle  Procedure(s) Performed: Procedure(s) (LRB): INCISION AND DRAINAGE OF ABDOMINAL ABSCESS (N/A)  Anesthesia type: General  Patient location: [facility]  Post pain: Pain level controlled  Post assessment: Post-op Vital signs reviewed  Last Vitals:  Filed Vitals:   05/24/13 0540  BP: 149/90  Pulse: 61  Temp: 36.7 C  Resp: 18    Post vital signs: Reviewed  Level of consciousness: sedated  Complications: No apparent anesthesia complications

## 2013-05-24 NOTE — Progress Notes (Signed)
Dressing changed performed per order.  Pain medication administer prior to dressing change.  Existing dressing removed, sanguinous drainage noted on packed gauze.  Wound bed is pink, granulated tissue noted.  Normal saline applied to gauze and wound packed per order.  Abdominal pad applied to outside of wound for support and hypafix applied to secure packing.  Patient tolerated with minimal discomfort.  Will continue to monitor.  Osvaldo Angst, RN--------------

## 2013-05-24 NOTE — Consult Note (Signed)
WOC consult Note Reason for Consult: Dehisced surgical wound  Wound type: surgical Pressure Ulcer POA: No Measurement: 0.5 cm x 19 cm x 2 cm Wound bed: red, ruddy, moist Drainage (amount, consistency, odor) Serosanguinous exudate on old dressings. Periwound: Intact, with mild maceration from damp dressings Dressing procedure/placement/frequency: I considered both negative pressure wound therapy and silver hydrofiber wound dressings for this wound, but after assessment and reading history, I have decided to place a silver hydrofiber for both its antimicrobial and absorptive properties. It is removed in one piece and creates no trauma or pain upon removal.  The dressing is changed once daily for three days and then the change frequency can be decreased to three times weekly on M-W-F. Patient may shower prior to wound dressing change. HHRN can assist patient with this dressing and with this frequency as well as continue the wound monitoring necessary in the immediate post-operative phase. If, after the return visit to the physician it is preferred/indicated for NPWT, the [facility] can easily implement this therapy at that time.   I will not follow, but will remain available to this patient and her surgical and nursing teams.  Please re-consult if needed. Thanks, Ladona Mow, MSN, RN, [facility], CWOCN [phone])

## 2013-05-25 ENCOUNTER — Encounter (HOSPITAL_COMMUNITY): Payer: Self-pay | Admitting: Obstetrics & Gynecology

## 2013-05-25 MED ORDER — OXYCODONE-ACETAMINOPHEN 5-325 MG PO TABS
1.0000 | ORAL_TABLET | ORAL | Status: DC | PRN
Start: 2013-05-25 — End: 2013-09-10

## 2013-05-25 NOTE — Progress Notes (Signed)
Patients wound draining thru her dressing.Large amounts of serosanguinous secretions.Silvadene guaze remain it place but wound is draining from both corners.Dressing was reinforced twice during the night due to this large amount of drainage.Patient is concerned over once daily dressing changes from [facility] service and can the dressing changes be done a different way.

## 2013-05-25 NOTE — Discharge Summary (Signed)
Physician Discharge Summary  Patient ID: Katie Doyle MRN: [id number] DOB/AGE: 1966/02/26 47 y.o.  Admit date: 05/22/2013 Discharge date: 05/25/2013  Admission Diagnoses: fascial dehiscence/subcutaneous fluid collection  Discharge Diagnoses: same Principal Problem:   Postoperative wound infection Active Problems:   Surgical wound dehiscence   Discharged Condition: good  Hospital Course: She was admitted and started on Zosyn. Her admission WBC was 10.7 and on the day of discharge it was 6. She underwent a wound exploration on HD #2 and her fascia was reapproximated with interrupted sutures. The wound was packed with wet to dry dressings. By POD #2 she was ambulating, tolerating po well, voiding, and voicing her readiness to go home. I inspected her incision and there was no evidence of infection. I arranged with Home Health the placement of a wound vac.  Consults: None  Significant Diagnostic Studies: labs: as above  Treatments: surgery: as above  Discharge Exam: Blood pressure 150/92, pulse 73, temperature 97.9 F (36.6 C), temperature source Oral, resp. rate 20, height 5' 4.5" (1.638 m), weight 98.204 kg (216 lb 8 oz), last menstrual period 01/19/2013, SpO2 97.00%. General appearance: alert Resp: clear to auscultation bilaterally Cardio: regular rate and rhythm, S1, S2 normal, no murmur, click, rub or gallop GI: soft, non-tender; bowel sounds normal; no masses,  no organomegaly Incision: healing well Disposition: 01-Home or Self Care     Medication List    STOP taking these medications       docusate sodium 100 MG capsule  Commonly known as:  COLACE      TAKE these medications       albuterol 108 (90 BASE) MCG/ACT inhaler  Commonly known as:  PROVENTIL HFA;VENTOLIN HFA  Inhale 2 puffs into the lungs every 6 (six) hours as needed for wheezing.     ibuprofen 800 MG tablet  Commonly known as:  ADVIL,MOTRIN  Take 1 tablet (800 mg total) by mouth every 8 (eight) hours  as needed for pain.     INTEGRA F 125-1 MG Caps  Take 1 capsule by mouth 2 (two) times daily.     oxyCODONE-acetaminophen 10-325 MG per tablet  Commonly known as:  PERCOCET  Take 1 tablet by mouth every 4 (four) hours as needed for pain.     oxyCODONE-acetaminophen 5-325 MG per tablet  Commonly known as:  PERCOCET/ROXICET  Take 1 tablet by mouth every 4 (four) hours as needed for pain.     traMADol 50 MG tablet  Commonly known as:  ULTRAM  Take 1 tablet (50 mg total) by mouth every 6 (six) hours as needed for pain.         Signed: Makari Sanko C. 05/25/2013, 5:12 PM

## 2013-05-25 NOTE — Progress Notes (Signed)
Patient ID: Katie Doyle, female   DOB: November 02, 1966, 47 y.o.   MRN: [id number]   POD #2/10  S. No problems, ambulating, voiding, eating without n/v  O. VSS, AF     Heart-rrr     Lungs-CTAB     Abd- benign     Ext- no evidence of DVT     Wound- open with pink healthy tissue, her dressing is saturated with clear drainage  A/P. POD #2 s/p opening of incision and closure of fascial dehiscence- stable      I think that she will be ready to go home if we can get a wound vac in place.

## 2013-05-25 NOTE — Care Management Note (Addendum)
    Page 1 of 2   05/26/2013     9:34:48 AM   CARE MANAGEMENT NOTE 05/26/2013  Patient:  Katie Doyle, Katie Doyle   Account Number:  [id number]  Date Initiated:  05/25/2013  Documentation initiated by:  Emilio Math  Subjective/Objective Assessment:   debridment and irrigation and repair of fascial dehiscence     Action/Plan:   NPWT- wound vac therapy   Anticipated DC Date:  05/25/2013   Anticipated DC Plan:  HOME W HOME HEALTH SERVICES      DC Planning Services  CM consult      Choice offered to / List presented to:        DME agency  [facility]     [facility] arranged  HH-1 RN      [facility] agency  [facility]   Status of service:  Completed, signed off Medicare Important Message given?   (If response is "NO", the following Medicare IM given date fields will be blank) Date Medicare IM given:   Date Additional Medicare IM given:    Discharge Disposition:  HOME/SELF CARE  Per UR Regulation:  Reviewed for med. necessity/level of care/duration of stay  If discussed at Long Length of Stay Meetings, dates discussed:    Comments:  05/26/13 0930 Arlyss Queen, RN BSN CM CM called on 05/23/13 in regards to pt needing [facility] for wound care at discharge, possibly 05/24/13. Pt has no insurance and CM instructed bedside RN to fax [facility] orders to [facility] at discharge. Bedside RN called CM  on 05/24/13 in regards to pt may need wound vac at discharge but was awaiting WOC RN recommendations. RN was instructed to fax all HH and wound vac orders to [facility] if pt discharged on 05/24/13. Bedside RN verbalized understanding of instructions.    05/25/13 1410 L. Floyce Stakes [facility] [id number] [phone]- call received from RN on floor stating MD would like for patient to have NPWT(Negative Pressure Wound Therapy ) for patient to have for home use.  Nurse Care Manager on nursing unit and spoke to MD- Dr.Dove and she has ordered patient to be discharged tonight with wet/dry dressing and for [facility] to come on  05/26/13 in the am and apply the NPWT. Spoke to patient and patient has   concerns that her wound will drain to much before a RN can come in the am to apply NPWT and would like a RN to come tonight.  Referral made to [facility] [phone] Baxter Hire and faxed NPWT prescription form to [phone] with Dr. Ellin Saba signature and required information. List of Home Health Agencies had been  given to patient chose [facility] [phone]. Kristen from [facility] called back and stated that a RN from [facility] will make 1st visit tonight between 7-8 pm and NPWT- (negative pressure wound therapy). Patient verbalized understanding. Patient stated she can afford her prescriptions.  No other needs identified at this time.

## 2013-05-26 ENCOUNTER — Encounter: Payer: Self-pay | Admitting: *Deleted

## 2013-05-27 ENCOUNTER — Other Ambulatory Visit: Payer: Self-pay | Admitting: Obstetrics & Gynecology

## 2013-05-27 ENCOUNTER — Telehealth: Payer: Self-pay | Admitting: *Deleted

## 2013-05-27 MED ORDER — LISINOPRIL-HYDROCHLOROTHIAZIDE 20-12.5 MG PO TABS: 1.0000 | ORAL_TABLET | Freq: Every day | ORAL | Status: DC

## 2013-05-27 NOTE — Progress Notes (Signed)
Pt recently admitted to hosp for TAH follwed by an admission for wound abscess which was drained.  Pt now being seen by visiting nurse and was noted to have an elevated BP.  Her BP's today was 162/118 and repeat 160/100's.  She is asymptomatic.  She had elevated BP's in the hosp.  She has a family hx of HTN but, has never been on meds herself.  Will begin Zestril 20/12.5mg  daily.  Pt has appt with Dr. Debroah Loop tomorrow.  Kalli Greenfield L. Harraway-Smith, M.D., [facility]

## 2013-05-27 NOTE — Telephone Encounter (Signed)
Patient had called and left message that her blood pressure is elevated. I called her back and she said that her problem had been resolved. She had spoken with Dr. Erin Fulling.

## 2013-05-28 ENCOUNTER — Ambulatory Visit (INDEPENDENT_AMBULATORY_CARE_PROVIDER_SITE_OTHER): Payer: Self-pay | Admitting: Obstetrics & Gynecology

## 2013-05-28 ENCOUNTER — Encounter: Payer: Self-pay | Admitting: Obstetrics & Gynecology

## 2013-05-28 VITALS — BP 154/96 | HR 88 | Ht 66.0 in | Wt 199.4 lb

## 2013-05-28 DIAGNOSIS — T8131XD Disruption of external operation (surgical) wound, not elsewhere classified, subsequent encounter: Secondary | ICD-10-CM

## 2013-05-28 DIAGNOSIS — B373 Candidiasis of vulva and vagina: Secondary | ICD-10-CM

## 2013-05-28 DIAGNOSIS — Z09 Encounter for follow-up examination after completed treatment for conditions other than malignant neoplasm: Secondary | ICD-10-CM

## 2013-05-28 MED ORDER — FLUCONAZOLE 150 MG PO TABS: 150.0000 mg | ORAL_TABLET | Freq: Once | ORAL | Status: DC

## 2013-05-28 NOTE — Patient Instructions (Signed)
Hysterectomy °Care After °Refer to this sheet in the next few weeks. These instructions provide you with information on caring for yourself after your procedure. Your caregiver may also give you more specific instructions. Your treatment has been planned according to current medical practices, but problems sometimes occur. Call your caregiver if you have any problems or questions after your procedure. °HOME CARE INSTRUCTIONS  °Healing will take time. You may have discomfort, tenderness, swelling, and bruising at the surgical site for about 2 weeks. This is normal and will get better as time goes on. °· Only take over-the-counter or prescription medicines for pain, discomfort, or fever as directed by your caregiver. °· Do not take aspirin. It can cause bleeding. °· Do not drive when taking pain medicine. °· Follow your caregiver's advice regarding exercise, lifting, driving, and general activities. °· Resume your usual diet as directed and allowed. °· Get plenty of rest and sleep. °· Do not douche, use tampons, or have sexual intercourse for at least 6 weeks or until your caregiver gives you permission. °· Change your bandages (dressings) as directed by your caregiver. °· Monitor your temperature. °· Take showers instead of baths for 2 to 3 weeks. °· Do not drink alcohol until your caregiver gives you permission. °· If you are constipated, you may take a mild laxative with your caregiver's permission. Bran foods may help with constipation problems. Drinking enough fluids to keep your urine clear or pale yellow may help as well. °· Try to have someone home with you for 1 or 2 weeks to help around the house. °· Keep all of your follow-up appointments as directed by your caregiver. °SEEK MEDICAL CARE IF:  °· You have swelling, redness, or increasing pain in the surgical cut (incision) area. °· You have pus coming from the incision. °· You notice a bad smell coming from the incision or dressing. °· You have swelling,  redness, or pain around the intravenous (IV) site. °· Your incision breaks open. °· You feel dizzy or lightheaded. °· You have pain or bleeding when you urinate. °· You have persistent diarrhea. °· You have persistent nausea and vomiting. °· You have abnormal vaginal discharge. °· You have a rash. °· You have any type of abnormal reaction or develop an allergy to your medicine. °· Your pain is not controlled with your prescribed medicine. °SEEK IMMEDIATE MEDICAL CARE IF:  °· You have a fever. °· You have severe abdominal pain. °· You have chest pain. °· You have shortness of breath. °· You faint. °· You have pain, swelling, or redness of your leg. °· You have heavy vaginal bleeding with blood clots. °MAKE SURE YOU: °· Understand these instructions. °· Will watch your condition. °· Will get help right away if you are not doing well or get worse. °Document Released: 06/22/2005 Document Revised: 02/25/2012 Document Reviewed: 07/20/2011 °ExitCare® Patient Information ©2014 [employer]. ° °

## 2013-05-28 NOTE — Progress Notes (Signed)
  Subjective:    Patient ID: Katie Doyle, female    DOB: 01-Sep-1966, 47 y.o.   MRN: [id number]  HPI 2 weeks postop. From TAH/BSO. Had wound explored and repaied for dehiscence of fascia, infection. Has wound vac and home health 3/week. Feels well except for vaginal discharge c/w yeast.  Allergies  Allergen Reactions  . Penicillins Itching    Pt tolerated  Zosyn during 05/22/13 admission   Current outpatient prescriptions:albuterol (PROVENTIL HFA;VENTOLIN HFA) 108 (90 BASE) MCG/ACT inhaler, Inhale 2 puffs into the lungs every 6 (six) hours as needed for wheezing., Disp: , Rfl: ;  Fe Fum-FePoly-FA-Vit C-Vit B3 (INTEGRA F) 125-1 MG CAPS, Take 1 capsule by mouth 2 (two) times daily., Disp: 60 capsule, Rfl: 1 ibuprofen (ADVIL,MOTRIN) 800 MG tablet, Take 1 tablet (800 mg total) by mouth every 8 (eight) hours as needed for pain., Disp: 30 tablet, Rfl: 0;  lisinopril-hydrochlorothiazide (ZESTORETIC) 20-12.5 MG per tablet, Take 1 tablet by mouth daily., Disp: 30 tablet, Rfl: 1;  oxyCODONE-acetaminophen (PERCOCET/ROXICET) 5-325 MG per tablet, Take 1 tablet by mouth every 4 (four) hours as needed for pain., Disp: 60 tablet, Rfl: 0 traMADol (ULTRAM) 50 MG tablet, Take 1 tablet (50 mg total) by mouth every 6 (six) hours as needed for pain., Disp: 30 tablet, Rfl: 0;  fluconazole (DIFLUCAN) 150 MG tablet, Take 1 tablet (150 mg total) by mouth once., Disp: 1 tablet, Rfl: 0       Review of Systems As above, no fever    Objective:   Physical Exam  Constitutional: She is oriented to person, place, and time. She appears well-developed. No distress.  Abdominal: Soft. There is no tenderness.  Wound vac in place  Neurological: She is alert and oriented to person, place, and time.  Skin: Skin is warm and dry.  Psychiatric: She has a normal mood and affect. Her behavior is normal.          Assessment & Plan:  Post op wound complication now doing well, continue current care Yeast vaginitis, Rx Diflucan  150 mg X 1  Adam Phenix, MD 05/28/2013

## 2013-06-03 ENCOUNTER — Telehealth: Payer: Self-pay | Admitting: *Deleted

## 2013-06-03 NOTE — Telephone Encounter (Addendum)
Katie Doyle called and left a message stating please call me.  Called Katie Doyle and she states she was seen last week for post op and was told to come back in 2 weeks, but her appointment is not until July 17th. States her home health nurse told her she must be seen in the 2 weeks because she has to be seen before the wound vac can be stopped. Informed her I do not make appointments- but I would send message to front desk to make appointment for next week and call her.  Also she states she wanted to tell Dr. Debroah Loop she is down to 5 percocet and wants a few more- states she is using the ibuprofen or tramadol but used the percocet before the nurse checks her wound.  Informed her he is not here today, but would send a note to him and if he does approve it , we will call her back.  Also informed he  That he might wait until he sees her .  Patient voices understanding.

## 2013-06-08 ENCOUNTER — Ambulatory Visit (INDEPENDENT_AMBULATORY_CARE_PROVIDER_SITE_OTHER): Payer: Self-pay | Admitting: Obstetrics & Gynecology

## 2013-06-08 VITALS — BP 127/83 | HR 74 | Temp 97.6°F | Ht 65.0 in | Wt 198.0 lb

## 2013-06-08 DIAGNOSIS — Z09 Encounter for follow-up examination after completed treatment for conditions other than malignant neoplasm: Secondary | ICD-10-CM

## 2013-06-08 NOTE — Progress Notes (Signed)
Notified by Clinic RN that pt had wound vac dc'd today and will need daily wet to dry dressing changes daily.  Pt used [facility] for the wound vac and nursing and would like to continue to use them for the daily dressing changes.  Pt has left the clinic.  I called Kristen at [facility] and left a message relaying the new orders and requested a call back.

## 2013-06-08 NOTE — Patient Instructions (Signed)
Hysterectomy °Care After °Refer to this sheet in the next few weeks. These instructions provide you with information on caring for yourself after your procedure. Your caregiver may also give you more specific instructions. Your treatment has been planned according to current medical practices, but problems sometimes occur. Call your caregiver if you have any problems or questions after your procedure. °HOME CARE INSTRUCTIONS  °Healing will take time. You may have discomfort, tenderness, swelling, and bruising at the surgical site for about 2 weeks. This is normal and will get better as time goes on. °· Only take over-the-counter or prescription medicines for pain, discomfort, or fever as directed by your caregiver. °· Do not take aspirin. It can cause bleeding. °· Do not drive when taking pain medicine. °· Follow your caregiver's advice regarding exercise, lifting, driving, and general activities. °· Resume your usual diet as directed and allowed. °· Get plenty of rest and sleep. °· Do not douche, use tampons, or have sexual intercourse for at least 6 weeks or until your caregiver gives you permission. °· Change your bandages (dressings) as directed by your caregiver. °· Monitor your temperature. °· Take showers instead of baths for 2 to 3 weeks. °· Do not drink alcohol until your caregiver gives you permission. °· If you are constipated, you may take a mild laxative with your caregiver's permission. Bran foods may help with constipation problems. Drinking enough fluids to keep your urine clear or pale yellow may help as well. °· Try to have someone home with you for 1 or 2 weeks to help around the house. °· Keep all of your follow-up appointments as directed by your caregiver. °SEEK MEDICAL CARE IF:  °· You have swelling, redness, or increasing pain in the surgical cut (incision) area. °· You have pus coming from the incision. °· You notice a bad smell coming from the incision or dressing. °· You have swelling,  redness, or pain around the intravenous (IV) site. °· Your incision breaks open. °· You feel dizzy or lightheaded. °· You have pain or bleeding when you urinate. °· You have persistent diarrhea. °· You have persistent nausea and vomiting. °· You have abnormal vaginal discharge. °· You have a rash. °· You have any type of abnormal reaction or develop an allergy to your medicine. °· Your pain is not controlled with your prescribed medicine. °SEEK IMMEDIATE MEDICAL CARE IF:  °· You have a fever. °· You have severe abdominal pain. °· You have chest pain. °· You have shortness of breath. °· You faint. °· You have pain, swelling, or redness of your leg. °· You have heavy vaginal bleeding with blood clots. °MAKE SURE YOU: °· Understand these instructions. °· Will watch your condition. °· Will get help right away if you are not doing well or get worse. °Document Released: 06/22/2005 Document Revised: 02/25/2012 Document Reviewed: 07/20/2011 °ExitCare® Patient Information ©2014 [employer]. ° °

## 2013-06-08 NOTE — Progress Notes (Signed)
Patient ID: Katie Doyle, female   DOB: 1966-12-07, 47 y.o.   MRN: [id number] Patient's last menstrual period was 01/19/2013. [id number] To have her wound vac removed today. May be ready to do wet to dry dressing.   Vac removed, wound is closed, may go to wet to dry dressing changes, RTC 2 weeks    Adam Phenix, MD 06/08/2013

## 2013-06-09 ENCOUNTER — Encounter: Payer: Self-pay | Admitting: Obstetrics & Gynecology

## 2013-07-02 ENCOUNTER — Encounter: Payer: Self-pay | Admitting: Obstetrics & Gynecology

## 2013-07-02 ENCOUNTER — Ambulatory Visit (INDEPENDENT_AMBULATORY_CARE_PROVIDER_SITE_OTHER): Payer: Self-pay | Admitting: Obstetrics & Gynecology

## 2013-07-02 ENCOUNTER — Ambulatory Visit: Payer: Self-pay | Admitting: Obstetrics & Gynecology

## 2013-07-02 VITALS — BP 156/102 | HR 71 | Temp 97.5°F | Ht 66.0 in | Wt 199.6 lb

## 2013-07-02 DIAGNOSIS — Z09 Encounter for follow-up examination after completed treatment for conditions other than malignant neoplasm: Secondary | ICD-10-CM

## 2013-07-02 DIAGNOSIS — N898 Other specified noninflammatory disorders of vagina: Secondary | ICD-10-CM

## 2013-07-02 NOTE — Progress Notes (Signed)
Patient ID: Katie Doyle, female   DOB: 05-12-1966, 47 y.o.   MRN: [id number] Subjective:feels well, sx of vaginal yeast     Katie Doyle is a 47 y.o. female who presents to the clinic 6 weeks status post total abdominal hysterectomy for fibroids. Eating a regular diet without difficulty. Bowel movements are normal. The patient is not having any pain.  The following portions of the patient's history were reviewed and updated as appropriate: allergies, current medications, past family history, past medical history, past social history, past surgical history and problem list.  Review of Systems Genitourinary:positive for vaginal discharge    Objective:    BP 156/102  Pulse 71  Temp(Src) 97.5 F (36.4 C) (Oral)  Ht 5\' 6"  (1.676 m)  Wt 199 lb 9.6 oz (90.538 kg)  BMI 32.23 kg/m2  LMP 01/19/2013 General:  alert, cooperative and no distress  Abdomen: soft, bowel sounds active, non-tender  Incision:   healing well, no drainage, no erythema, no hernia, no seroma, no swelling, no dehiscence, incision well approximated    Scant vaginal discharge, cuff intact Assessment:    Doing well postoperatively. Operative findings again reviewed. Pathology report discussed.    Plan:    1. Continue any current medications. 2. Wound care discussed. 3. Activity restrictions: none 4. Anticipated return to work: now. 5. Wet prep sent , notify of result  Adam Phenix, MD 07/02/2013

## 2013-07-02 NOTE — Patient Instructions (Signed)
Hysterectomy °Care After °Refer to this sheet in the next few weeks. These instructions provide you with information on caring for yourself after your procedure. Your caregiver may also give you more specific instructions. Your treatment has been planned according to current medical practices, but problems sometimes occur. Call your caregiver if you have any problems or questions after your procedure. °HOME CARE INSTRUCTIONS  °Healing will take time. You may have discomfort, tenderness, swelling, and bruising at the surgical site for about 2 weeks. This is normal and will get better as time goes on. °· Only take over-the-counter or prescription medicines for pain, discomfort, or fever as directed by your caregiver. °· Do not take aspirin. It can cause bleeding. °· Do not drive when taking pain medicine. °· Follow your caregiver's advice regarding exercise, lifting, driving, and general activities. °· Resume your usual diet as directed and allowed. °· Get plenty of rest and sleep. °· Do not douche, use tampons, or have sexual intercourse for at least 6 weeks or until your caregiver gives you permission. °· Change your bandages (dressings) as directed by your caregiver. °· Monitor your temperature. °· Take showers instead of baths for 2 to 3 weeks. °· Do not drink alcohol until your caregiver gives you permission. °· If you are constipated, you may take a mild laxative with your caregiver's permission. Bran foods may help with constipation problems. Drinking enough fluids to keep your urine clear or pale yellow may help as well. °· Try to have someone home with you for 1 or 2 weeks to help around the house. °· Keep all of your follow-up appointments as directed by your caregiver. °SEEK MEDICAL CARE IF:  °· You have swelling, redness, or increasing pain in the surgical cut (incision) area. °· You have pus coming from the incision. °· You notice a bad smell coming from the incision or dressing. °· You have swelling,  redness, or pain around the intravenous (IV) site. °· Your incision breaks open. °· You feel dizzy or lightheaded. °· You have pain or bleeding when you urinate. °· You have persistent diarrhea. °· You have persistent nausea and vomiting. °· You have abnormal vaginal discharge. °· You have a rash. °· You have any type of abnormal reaction or develop an allergy to your medicine. °· Your pain is not controlled with your prescribed medicine. °SEEK IMMEDIATE MEDICAL CARE IF:  °· You have a fever. °· You have severe abdominal pain. °· You have chest pain. °· You have shortness of breath. °· You faint. °· You have pain, swelling, or redness of your leg. °· You have heavy vaginal bleeding with blood clots. °MAKE SURE YOU: °· Understand these instructions. °· Will watch your condition. °· Will get help right away if you are not doing well or get worse. °Document Released: 06/22/2005 Document Revised: 02/25/2012 Document Reviewed: 07/20/2011 °ExitCare® Patient Information ©2014 [employer]. ° °

## 2013-07-03 LAB — WET PREP, GENITAL
Trich, Wet Prep: NONE SEEN
Yeast Wet Prep HPF POC: NONE SEEN

## 2013-07-06 ENCOUNTER — Telehealth: Payer: Self-pay | Admitting: General Practice

## 2013-07-06 NOTE — Telephone Encounter (Signed)
Message copied by Kathee Delton on Mon Jul 06, 2013  4:29 PM ------      Message from: Adam Phenix      Created: Mon Jul 06, 2013 10:51 AM       Wet prep no vaginitis ------

## 2013-07-06 NOTE — Telephone Encounter (Signed)
Called patient, no answer- left message to call us back at the clinics 

## 2013-07-07 NOTE — Telephone Encounter (Signed)
Called pt and informed pt that her wet prep came back normal and that she can follow up as needed.  Pt stated understanding.

## 2013-07-17 ENCOUNTER — Encounter: Payer: Self-pay | Admitting: *Deleted

## 2013-09-10 ENCOUNTER — Encounter (HOSPITAL_COMMUNITY): Payer: Self-pay | Admitting: Emergency Medicine

## 2013-09-10 ENCOUNTER — Emergency Department (HOSPITAL_COMMUNITY)
Admission: EM | Admit: 2013-09-10 | Discharge: 2013-09-10 | Disposition: A | Discharge status: Home / Self Care | Payer: No Typology Code available for payment source | Attending: Emergency Medicine | Admitting: Emergency Medicine

## 2013-09-10 DIAGNOSIS — J45909 Unspecified asthma, uncomplicated: Secondary | ICD-10-CM | POA: Insufficient documentation

## 2013-09-10 DIAGNOSIS — Y92410 Unspecified street and highway as the place of occurrence of the external cause: Secondary | ICD-10-CM | POA: Insufficient documentation

## 2013-09-10 DIAGNOSIS — Z880 Allergy status to penicillin: Secondary | ICD-10-CM | POA: Insufficient documentation

## 2013-09-10 DIAGNOSIS — S8001XA Contusion of right knee, initial encounter: Secondary | ICD-10-CM

## 2013-09-10 DIAGNOSIS — F17200 Nicotine dependence, unspecified, uncomplicated: Secondary | ICD-10-CM | POA: Insufficient documentation

## 2013-09-10 DIAGNOSIS — S39012A Strain of muscle, fascia and tendon of lower back, initial encounter: Secondary | ICD-10-CM

## 2013-09-10 DIAGNOSIS — D649 Anemia, unspecified: Secondary | ICD-10-CM | POA: Insufficient documentation

## 2013-09-10 DIAGNOSIS — Z79899 Other long term (current) drug therapy: Secondary | ICD-10-CM | POA: Insufficient documentation

## 2013-09-10 DIAGNOSIS — S339XXA Sprain of unspecified parts of lumbar spine and pelvis, initial encounter: Secondary | ICD-10-CM | POA: Insufficient documentation

## 2013-09-10 DIAGNOSIS — Y9389 Activity, other specified: Secondary | ICD-10-CM | POA: Insufficient documentation

## 2013-09-10 DIAGNOSIS — Z8742 Personal history of other diseases of the female genital tract: Secondary | ICD-10-CM | POA: Insufficient documentation

## 2013-09-10 DIAGNOSIS — S8000XA Contusion of unspecified knee, initial encounter: Secondary | ICD-10-CM | POA: Insufficient documentation

## 2013-09-10 MED ORDER — CYCLOBENZAPRINE HCL 5 MG PO TABS: 5.0000 mg | ORAL_TABLET | Freq: Three times a day (TID) | ORAL | Status: DC | PRN

## 2013-09-10 MED ORDER — IBUPROFEN 200 MG PO TABS
600.0000 mg | ORAL_TABLET | Freq: Once | ORAL | Status: AC
  Administered 2013-09-10: 600 mg via ORAL
  Filled 2013-09-10: qty 3

## 2013-09-10 MED ORDER — IBUPROFEN 600 MG PO TABS: 600.0000 mg | ORAL_TABLET | Freq: Three times a day (TID) | ORAL | Status: DC | PRN

## 2013-09-10 MED ORDER — CYCLOBENZAPRINE HCL 10 MG PO TABS
5.0000 mg | ORAL_TABLET | Freq: Once | ORAL | Status: AC
  Administered 2013-09-10: 5 mg via ORAL
  Filled 2013-09-10: qty 1

## 2013-09-10 MED ORDER — IBUPROFEN 200 MG PO TABS
200.0000 mg | ORAL_TABLET | Freq: Once | ORAL | Status: DC
  Filled 2013-09-10: qty 1
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
## 2013-09-10 NOTE — ED Provider Notes (Signed)
Medical screening examination/treatment/procedure(s) were performed by non-physician practitioner and as supervising physician I was immediately available for consultation/collaboration.   Johaan Ryser, MD 09/10/13 2316 

## 2013-10-16 ENCOUNTER — Emergency Department (HOSPITAL_COMMUNITY)
Admission: EM | Admit: 2013-10-16 | Discharge: 2013-10-16 | Disposition: A | Discharge status: Home / Self Care | Payer: Self-pay | Attending: Emergency Medicine | Admitting: Emergency Medicine

## 2013-10-16 ENCOUNTER — Emergency Department (HOSPITAL_COMMUNITY): Payer: No Typology Code available for payment source

## 2013-10-16 DIAGNOSIS — M25512 Pain in left shoulder: Secondary | ICD-10-CM

## 2013-10-16 DIAGNOSIS — M25519 Pain in unspecified shoulder: Secondary | ICD-10-CM | POA: Insufficient documentation

## 2013-10-16 DIAGNOSIS — F17200 Nicotine dependence, unspecified, uncomplicated: Secondary | ICD-10-CM | POA: Insufficient documentation

## 2013-10-16 DIAGNOSIS — Z880 Allergy status to penicillin: Secondary | ICD-10-CM | POA: Insufficient documentation

## 2013-10-16 DIAGNOSIS — Z8742 Personal history of other diseases of the female genital tract: Secondary | ICD-10-CM | POA: Insufficient documentation

## 2013-10-16 DIAGNOSIS — D649 Anemia, unspecified: Secondary | ICD-10-CM | POA: Insufficient documentation

## 2013-10-16 DIAGNOSIS — I129 Hypertensive chronic kidney disease with stage 1 through stage 4 chronic kidney disease, or unspecified chronic kidney disease: Secondary | ICD-10-CM | POA: Insufficient documentation

## 2013-10-16 DIAGNOSIS — L509 Urticaria, unspecified: Secondary | ICD-10-CM | POA: Insufficient documentation

## 2013-10-16 DIAGNOSIS — Z79899 Other long term (current) drug therapy: Secondary | ICD-10-CM | POA: Insufficient documentation

## 2013-10-16 MED ORDER — HYDROCODONE-ACETAMINOPHEN 5-325 MG PO TABS: 2.0000 | ORAL_TABLET | ORAL | Status: DC | PRN

## 2013-10-16 NOTE — ED Notes (Signed)
Pt c/o "bites" of some type that have been occurring for approx 2 months.  Bumps shown on buttock appear to be mosquito bites.  She reports they never come to a head but are extremely itchy.

## 2013-10-16 NOTE — ED Notes (Signed)
MD at bedside. 

## 2013-10-16 NOTE — ED Notes (Signed)
Left shoulder pain; difficult to raise arm.  Dislocated same shoulder x 2 yrs ago. States, "breaking out in a rash."

## 2013-10-16 NOTE — ED Notes (Signed)
Returned from radiology. 

## 2013-10-16 NOTE — ED Notes (Signed)
Pt reports she has been out of her htn meds for a while so is not surprised her BP is high. Suggested she contact her physician for a prescription that would be less expensive or free.  She will follow up on that.

## 2013-10-16 NOTE — ED Provider Notes (Signed)
CSN: [id number]     Arrival date & time 10/16/13  0215 History   First MD Initiated Contact with Patient 10/16/13 0304     Chief Complaint  Patient presents with  . Shoulder Pain  . Insect Bite   (Consider location/radiation/quality/duration/timing/severity/associated sxs/prior Treatment) HPI Comments: Patient presents with complaints of left shoulder pain. She states that she has dislocated in the past. She denies any new injury or trauma. She states that she woke up this way and wonders if she slept on it wrong. She is having difficulty lifting the arm and moving it in any direction. She also complains of raised bumps all over her body and is concerned that she has been bitten by something. She denies any fevers or chills. These bumps are itchy but not painful.  Patient is a 47 y.o. female presenting with shoulder pain. The history is provided by the patient.  Shoulder Pain This is a recurrent problem. The current episode started 2 days ago. The problem occurs constantly. The problem has not changed since onset.Pertinent negatives include no shortness of breath. Exacerbated by: Movement and palpation. Nothing relieves the symptoms. She has tried nothing for the symptoms. The treatment provided no relief.    Past Medical History  Diagnosis Date  . Fibroids   . Allergy   . SVD (spontaneous vaginal delivery) 1984    x 1  . Asthma     rarely uses inhaler  . Anemia    Past Surgical History  Procedure Laterality Date  . Appendectomy    . Cesarean section  1992    x 1  . Wisdom tooth extraction    . Abdominal hysterectomy N/A 05/14/2013    Procedure: TOTAL ABDOMINAL HYSTERECTOMY;  Surgeon: Adam Phenix, MD;  Location: [facility];  Service: Gynecology;  Laterality: N/A;  . Salpingoophorectomy Bilateral 05/14/2013    Procedure: SALPINGO OOPHORECTOMY;  Surgeon: Adam Phenix, MD;  Location: [facility];  Service: Gynecology;  Laterality: Bilateral;  . Cysto N/A 05/14/2013    Procedure: CYSTO;   Surgeon: Adam Phenix, MD;  Location: [facility];  Service: Gynecology;  Laterality: N/A;  . Incision and drainage abscess N/A 05/23/2013    Procedure: INCISION AND DRAINAGE OF ABDOMINAL ABSCESS;  Surgeon: Adam Phenix, MD;  Location: [facility];  Service: Gynecology;  Laterality: N/A;   Family History  Problem Relation Age of Onset  . Diabetes Mother   . Hypertension Mother   . Cancer Father   . Diabetes Brother    History  Substance Use Topics  . Smoking status: Light Tobacco Smoker -- 0.25 packs/day for 4 years    Types: Cigarettes  . Smokeless tobacco: Never Used  . Alcohol Use: Yes     Comment: socially   OB History   Grav Para Term Preterm Abortions TAB SAB Ect Mult Living   3 2 2  0 1 1 0 0 0 2     Review of Systems  Respiratory: Negative for shortness of breath.   All other systems reviewed and are negative.    Allergies  Penicillins  Home Medications   Current Outpatient Rx  Name  Route  Sig  Dispense  Refill  . albuterol (PROVENTIL HFA;VENTOLIN HFA) 108 (90 BASE) MCG/ACT inhaler   Inhalation   Inhale 2 puffs into the lungs every 6 (six) hours as needed for wheezing.         . cyclobenzaprine (FLEXERIL) 5 MG tablet   Oral   Take 1 tablet (5 mg  total) by mouth 3 (three) times daily as needed for muscle spasms.   30 tablet   0   . Fe Fum-FePoly-FA-Vit C-Vit B3 (INTEGRA F) 125-1 MG CAPS   Oral   Take 1 capsule by mouth 2 (two) times daily.   60 capsule   1   . lisinopril-hydrochlorothiazide (ZESTORETIC) 20-12.5 MG per tablet   Oral   Take 1 tablet by mouth daily.   30 tablet   1   . ibuprofen (ADVIL,MOTRIN) 600 MG tablet   Oral   Take 1 tablet (600 mg total) by mouth every 8 (eight) hours as needed for pain.   30 tablet   0    BP 152/91  Pulse 74  Temp(Src) 98.2 F (36.8 C) (Oral)  Resp 20  SpO2 98%  LMP 01/19/2013 Physical Exam  Nursing note and vitals reviewed. Constitutional: She is oriented to person, place, and time. She appears  well-developed and well-nourished. No distress.  HENT:  Head: Normocephalic and atraumatic.  Neck: Normal range of motion. Neck supple.  Musculoskeletal: Normal range of motion.  The left shoulder appears grossly normal. There is no deformity or swelling. There is pain with range of motion in any direction. Distally the ulnar and radial pulses are intact and the patient is able to move all fingers and sensation is intact.  Neurological: She is alert and oriented to person, place, and time.  Skin: Skin is warm and dry. She is not diaphoretic.  There are multiple raised, urticarial appearing lesions on the torso and extremities.    ED Course  Procedures (including critical care time) Labs Review Labs Reviewed - No data to display Imaging Review No results found.  EKG Interpretation   None       MDM  No diagnosis found. Patient presents with complaints of left shoulder pain with no history of injury. She does have a history of dislocation in the past and is concerned he may have dislocated it in her sleep. Physical examination and x-rays of the shoulder suggest otherwise. She will be discharged with anti-inflammatories and pain medications. Followup with her primary Dr. if not improving.  She also has complaints of welts on her body. What caused this she is unsure. These appear to be urticarial lesions and I will recommend she take Benadryl for these. To return as needed if she develops any new problems.    Geoffery Lyons, MD 10/16/13 [phone]

## 2013-11-14 ENCOUNTER — Encounter (HOSPITAL_COMMUNITY): Payer: Self-pay | Admitting: Emergency Medicine

## 2013-11-14 ENCOUNTER — Emergency Department (HOSPITAL_COMMUNITY)
Admission: EM | Admit: 2013-11-14 | Discharge: 2013-11-14 | Disposition: A | Discharge status: Home / Self Care | Payer: No Typology Code available for payment source | Attending: Emergency Medicine | Admitting: Emergency Medicine

## 2013-11-14 DIAGNOSIS — K047 Periapical abscess without sinus: Secondary | ICD-10-CM

## 2013-11-14 DIAGNOSIS — Z8742 Personal history of other diseases of the female genital tract: Secondary | ICD-10-CM | POA: Insufficient documentation

## 2013-11-14 DIAGNOSIS — I10 Essential (primary) hypertension: Secondary | ICD-10-CM

## 2013-11-14 DIAGNOSIS — J45901 Unspecified asthma with (acute) exacerbation: Secondary | ICD-10-CM | POA: Insufficient documentation

## 2013-11-14 DIAGNOSIS — D649 Anemia, unspecified: Secondary | ICD-10-CM | POA: Insufficient documentation

## 2013-11-14 DIAGNOSIS — J40 Bronchitis, not specified as acute or chronic: Secondary | ICD-10-CM

## 2013-11-14 DIAGNOSIS — F17200 Nicotine dependence, unspecified, uncomplicated: Secondary | ICD-10-CM | POA: Insufficient documentation

## 2013-11-14 DIAGNOSIS — Z79899 Other long term (current) drug therapy: Secondary | ICD-10-CM | POA: Insufficient documentation

## 2013-11-14 DIAGNOSIS — Z880 Allergy status to penicillin: Secondary | ICD-10-CM | POA: Insufficient documentation

## 2013-11-14 MED ORDER — IBUPROFEN 800 MG PO TABS: 800.0000 mg | ORAL_TABLET | Freq: Three times a day (TID) | ORAL | Status: DC

## 2013-11-14 MED ORDER — CLINDAMYCIN HCL 150 MG PO CAPS: 300.0000 mg | ORAL_CAPSULE | Freq: Three times a day (TID) | ORAL | Status: DC

## 2013-11-14 MED ORDER — ALBUTEROL SULFATE HFA 108 (90 BASE) MCG/ACT IN AERS
2.0000 | INHALATION_SPRAY | RESPIRATORY_TRACT | Status: DC | PRN
  Administered 2013-11-14: 2 via RESPIRATORY_TRACT
  Filled 2013-11-14: qty 6.7

## 2013-11-14 MED ORDER — CLINDAMYCIN HCL 300 MG PO CAPS
300.0000 mg | ORAL_CAPSULE | Freq: Once | ORAL | Status: AC
  Administered 2013-11-14: 300 mg via ORAL
  Filled 2013-11-14: qty 1

## 2013-11-14 MED ORDER — GUAIFENESIN ER 600 MG PO TB12: 600.0000 mg | ORAL_TABLET | Freq: Two times a day (BID) | ORAL | Status: DC

## 2013-11-14 MED ORDER — LISINOPRIL-HYDROCHLOROTHIAZIDE 20-25 MG PO TABS: 1.0000 | ORAL_TABLET | Freq: Every day | ORAL | Status: DC

## 2013-11-14 MED ORDER — IBUPROFEN 800 MG PO TABS
800.0000 mg | ORAL_TABLET | Freq: Once | ORAL | Status: AC
  Administered 2013-11-14: 800 mg via ORAL
  Filled 2013-11-14: qty 1

## 2013-11-14 NOTE — ED Provider Notes (Signed)
CSN: [id number]     Arrival date & time 11/14/13  2017 History  This chart was scribed for non-physician practitioner Ivonne Andrew, PA,working with Gilda Crease, * by Ronal Fear, ED scribe. This patient was seen in room WTR9/WTR9 and the patient's care was started at 9:07 PM.    Chief Complaint  Patient presents with  . Cough  . Dental Pain    Patient is a 47 y.o. female presenting with cough and tooth pain. The history is provided by the patient. No language interpreter was used.  Cough Cough characteristics:  Productive Associated symptoms: chills, sinus congestion and wheezing   Associated symptoms: no chest pain, no ear pain, no fever and no sore throat   Dental Pain Associated symptoms: congestion and facial swelling   Associated symptoms: no fever   HPI Comments: Katie Doyle is a 47 y.o. female with a hx of HTN who presents to the Emergency Department complaining of gradual onset right lower dental pain that started swelling 1x days with productive cough and congestion for 3-4x days. She has applied heat to the area with some relief but she has not tried any medications. The swelling has gone down but there is still pain. Denies fever, chills, or diaphoresis. Pt takes lisinopril for her HTN but has run out. She does not appear to be in any acute distress with no other complaints.    Past Medical History  Diagnosis Date  . Fibroids   . Allergy   . SVD (spontaneous vaginal delivery) 1984    x 1  . Asthma     rarely uses inhaler  . Anemia    Past Surgical History  Procedure Laterality Date  . Appendectomy    . Cesarean section  1992    x 1  . Wisdom tooth extraction    . Abdominal hysterectomy N/A 05/14/2013    Procedure: TOTAL ABDOMINAL HYSTERECTOMY;  Surgeon: Adam Phenix, MD;  Location: [facility];  Service: Gynecology;  Laterality: N/A;  . Salpingoophorectomy Bilateral 05/14/2013    Procedure: SALPINGO OOPHORECTOMY;  Surgeon: Adam Phenix, MD;  Location: [facility];  Service: Gynecology;  Laterality: Bilateral;  . Cysto N/A 05/14/2013    Procedure: CYSTO;  Surgeon: Adam Phenix, MD;  Location: [facility];  Service: Gynecology;  Laterality: N/A;  . Incision and drainage abscess N/A 05/23/2013    Procedure: INCISION AND DRAINAGE OF ABDOMINAL ABSCESS;  Surgeon: Adam Phenix, MD;  Location: [facility];  Service: Gynecology;  Laterality: N/A;   Family History  Problem Relation Age of Onset  . Diabetes Mother   . Hypertension Mother   . Cancer Father   . Diabetes Brother    History  Substance Use Topics  . Smoking status: Light Tobacco Smoker -- 0.25 packs/day for 4 years    Types: Cigarettes  . Smokeless tobacco: Never Used  . Alcohol Use: Yes     Comment: socially   OB History   Grav Para Term Preterm Abortions TAB SAB Ect Mult Living   3 2 2  0 1 1 0 0 0 2     Review of Systems  Constitutional: Positive for chills. Negative for fever.  HENT: Positive for congestion, dental problem, facial swelling and sinus pressure. Negative for ear pain and sore throat.   Respiratory: Positive for cough and wheezing.   Cardiovascular: Negative for chest pain.  Gastrointestinal: Negative for nausea, vomiting and abdominal pain.  All other systems reviewed and are negative.    Allergies  Penicillins  Home Medications   Current Outpatient Rx  Name  Route  Sig  Dispense  Refill  . albuterol (PROVENTIL HFA;VENTOLIN HFA) 108 (90 BASE) MCG/ACT inhaler   Inhalation   Inhale 2 puffs into the lungs every 6 (six) hours as needed for wheezing.         . Fe Fum-FePoly-FA-Vit C-Vit B3 (INTEGRA F) 125-1 MG CAPS   Oral   Take 1 capsule by mouth 2 (two) times daily.   60 capsule   1   . HYDROcodone-acetaminophen (NORCO) 5-325 MG per tablet   Oral   Take 2 tablets by mouth every 4 (four) hours as needed for pain.   15 tablet   0   . ibuprofen (ADVIL,MOTRIN) 600 MG tablet   Oral   Take 1 tablet (600 mg total) by mouth every 8 (eight) hours as needed for  pain.   30 tablet   0   . lisinopril-hydrochlorothiazide (ZESTORETIC) 20-12.5 MG per tablet   Oral   Take 1 tablet by mouth daily.   30 tablet   1    BP 171/107  Pulse 77  Temp(Src) 98.6 F (37 C) (Oral)  Resp 18  SpO2 99%  LMP 01/19/2013 Physical Exam  Nursing note and vitals reviewed. Constitutional: She is oriented to person, place, and time. She appears well-developed and well-nourished. No distress.  HENT:  Head: Normocephalic and atraumatic.  Mouth/Throat: Oropharynx is clear and moist. No oropharyngeal exudate.  2nd premolar slight fracture with cavity between the teeth.  Pain to percussion over the tooth. There is no severe swelling of the adjacent gums. No pain or swelling under the tongue. No signs of Ludwig angina.  Eyes: EOM are normal. Pupils are equal, round, and reactive to light. Right eye exhibits no discharge. Left eye exhibits no discharge.  Neck: Normal range of motion. Neck supple.  Cardiovascular: Normal rate, regular rhythm and normal heart sounds.   No murmur heard. Pulmonary/Chest: Effort normal. No stridor. No respiratory distress. She has wheezes (slight expiratory wheeze). She has no rales. She exhibits no tenderness.  Abdominal: Soft. Bowel sounds are normal. She exhibits no distension and no mass. There is no tenderness. There is no rebound.  Musculoskeletal: Normal range of motion. She exhibits no tenderness.  Lymphadenopathy:    She has no cervical adenopathy.  Neurological: She is alert and oriented to person, place, and time.  Skin: Skin is warm and dry. No rash noted.  Psychiatric: She has a normal mood and affect. Judgment normal.    ED Course  Procedures    DIAGNOSTIC STUDIES: Oxygen Saturation is 99% on RA, normal by my interpretation.    COORDINATION OF CARE:  9:12 PM- the patient seen and evaluated. Patient appears in some discomfort. Patient was offered a dental block but refused. Pt advised of plan for treatment inhaler for  bronchitis and and antibiotic for tooth with referral to a dentist and pt agrees.   MDM   1. Periapical abscess   2. Bronchitis   3. Hypertension       I personally performed the services described in this documentation, which was scribed in my presence. The recorded information has been reviewed and is accurate.    Angus Seller, PA-C 11/14/13 2133

## 2013-11-14 NOTE — ED Notes (Signed)
Pt c/o abscessed tooth to R lower side of mouth. Pain since yesterday. Pt states she has no dentist. Pt also c/o nasal congestion and productive cough for the past 3 days. Pt with no acute distress. Pt ambulatory to exam room with steady gait.

## 2013-11-16 NOTE — ED Provider Notes (Signed)
Medical screening examination/treatment/procedure(s) were performed by non-physician practitioner and as supervising physician I was immediately available for consultation/collaboration.  EKG Interpretation   None         Gilda Crease, MD 11/16/13 1620

## 2013-12-20 ENCOUNTER — Emergency Department (HOSPITAL_COMMUNITY)
Admission: EM | Admit: 2013-12-20 | Discharge: 2013-12-20 | Disposition: A | Discharge status: Home / Self Care | Payer: No Typology Code available for payment source | Attending: Emergency Medicine | Admitting: Emergency Medicine

## 2013-12-20 ENCOUNTER — Encounter (HOSPITAL_COMMUNITY): Payer: Self-pay | Admitting: Emergency Medicine

## 2013-12-20 DIAGNOSIS — Y9389 Activity, other specified: Secondary | ICD-10-CM | POA: Insufficient documentation

## 2013-12-20 DIAGNOSIS — Z862 Personal history of diseases of the blood and blood-forming organs and certain disorders involving the immune mechanism: Secondary | ICD-10-CM | POA: Insufficient documentation

## 2013-12-20 DIAGNOSIS — M25512 Pain in left shoulder: Secondary | ICD-10-CM

## 2013-12-20 DIAGNOSIS — J45909 Unspecified asthma, uncomplicated: Secondary | ICD-10-CM | POA: Insufficient documentation

## 2013-12-20 DIAGNOSIS — S0993XA Unspecified injury of face, initial encounter: Secondary | ICD-10-CM | POA: Insufficient documentation

## 2013-12-20 DIAGNOSIS — R269 Unspecified abnormalities of gait and mobility: Secondary | ICD-10-CM | POA: Insufficient documentation

## 2013-12-20 DIAGNOSIS — K002 Abnormalities of size and form of teeth: Secondary | ICD-10-CM | POA: Insufficient documentation

## 2013-12-20 DIAGNOSIS — Z880 Allergy status to penicillin: Secondary | ICD-10-CM | POA: Insufficient documentation

## 2013-12-20 DIAGNOSIS — Y929 Unspecified place or not applicable: Secondary | ICD-10-CM | POA: Insufficient documentation

## 2013-12-20 DIAGNOSIS — S4980XA Other specified injuries of shoulder and upper arm, unspecified arm, initial encounter: Secondary | ICD-10-CM | POA: Insufficient documentation

## 2013-12-20 DIAGNOSIS — S46909A Unspecified injury of unspecified muscle, fascia and tendon at shoulder and upper arm level, unspecified arm, initial encounter: Secondary | ICD-10-CM | POA: Insufficient documentation

## 2013-12-20 DIAGNOSIS — Z79899 Other long term (current) drug therapy: Secondary | ICD-10-CM | POA: Insufficient documentation

## 2013-12-20 DIAGNOSIS — IMO0002 Reserved for concepts with insufficient information to code with codable children: Secondary | ICD-10-CM | POA: Insufficient documentation

## 2013-12-20 DIAGNOSIS — Z8742 Personal history of other diseases of the female genital tract: Secondary | ICD-10-CM | POA: Insufficient documentation

## 2013-12-20 DIAGNOSIS — F17200 Nicotine dependence, unspecified, uncomplicated: Secondary | ICD-10-CM | POA: Insufficient documentation

## 2013-12-20 DIAGNOSIS — Z792 Long term (current) use of antibiotics: Secondary | ICD-10-CM | POA: Insufficient documentation

## 2013-12-20 DIAGNOSIS — W208XXA Other cause of strike by thrown, projected or falling object, initial encounter: Secondary | ICD-10-CM | POA: Insufficient documentation

## 2013-12-20 DIAGNOSIS — K029 Dental caries, unspecified: Secondary | ICD-10-CM | POA: Insufficient documentation

## 2013-12-20 DIAGNOSIS — T07XXXA Unspecified multiple injuries, initial encounter: Secondary | ICD-10-CM

## 2013-12-20 DIAGNOSIS — W268XXA Contact with other sharp object(s), not elsewhere classified, initial encounter: Secondary | ICD-10-CM | POA: Insufficient documentation

## 2013-12-20 DIAGNOSIS — S199XXA Unspecified injury of neck, initial encounter: Secondary | ICD-10-CM

## 2013-12-20 MED ORDER — SULFAMETHOXAZOLE-TRIMETHOPRIM 800-160 MG PO TABS: 1.0000 | ORAL_TABLET | Freq: Two times a day (BID) | ORAL | Status: DC

## 2013-12-20 MED ORDER — HYDROCODONE-ACETAMINOPHEN 5-325 MG PO TABS: 1.0000 | ORAL_TABLET | ORAL | Status: DC | PRN

## 2013-12-20 MED ORDER — LIDOCAINE HCL 2 % IJ SOLN
10.0000 mL | Freq: Once | INTRAMUSCULAR | Status: DC
  Filled 2013-12-20: qty 20

## 2013-12-20 NOTE — ED Provider Notes (Signed)
CSN: [id number]     Arrival date & time 12/20/13  0831 History   First MD Initiated Contact with Patient 12/20/13 [phone]     Chief Complaint  Patient presents with  . Dental Pain  . Neck Injury  . Shoulder Pain   (Consider location/radiation/quality/duration/timing/severity/associated sxs/prior Treatment) HPI Comments: Katie Doyle is a 48 y.o. year-old female with a past medical history of asthma, anemia,  presenting the Emergency Department with a chief complaint of tooth pain for over a month.  The patient reports she was evaluated in the past for similar complants and has been unable to see a dentist due to financial reasons. She reports increase pain with chewing.  Denies fever.  She also complains of multiple abrasions to her face and neck.  She reports she was moving a piece of heavy furniture when it fell on her and she was cut by broken glass. The patient complains of left shoulder pain since a dislocation a year ago.  She reports she followed up with an Orthopedics and was told she needed an MRI.  She is requesting an MRI in the ED. She reports teatnus within 10 years.    Patient is a 48 y.o. female presenting with tooth pain. The history is provided by the patient and medical records. No language interpreter was used.  Dental Pain Associated symptoms: no fever     Past Medical History  Diagnosis Date  . Fibroids   . Allergy   . SVD (spontaneous vaginal delivery) 1984    x 1  . Asthma     rarely uses inhaler  . Anemia    Past Surgical History  Procedure Laterality Date  . Appendectomy    . Cesarean section  1992    x 1  . Wisdom tooth extraction    . Abdominal hysterectomy N/A 05/14/2013    Procedure: TOTAL ABDOMINAL HYSTERECTOMY;  Surgeon: Woodroe Mode, MD;  Location: [facility];  Service: Gynecology;  Laterality: N/A;  . Salpingoophorectomy Bilateral 05/14/2013    Procedure: SALPINGO OOPHORECTOMY;  Surgeon: Woodroe Mode, MD;  Location: [facility];  Service: Gynecology;   Laterality: Bilateral;  . Cysto N/A 05/14/2013    Procedure: CYSTO;  Surgeon: Woodroe Mode, MD;  Location: [facility];  Service: Gynecology;  Laterality: N/A;  . Incision and drainage abscess N/A 05/23/2013    Procedure: INCISION AND DRAINAGE OF ABDOMINAL ABSCESS;  Surgeon: Woodroe Mode, MD;  Location: [facility];  Service: Gynecology;  Laterality: N/A;   Family History  Problem Relation Age of Onset  . Diabetes Mother   . Hypertension Mother   . Cancer Father   . Diabetes Brother    History  Substance Use Topics  . Smoking status: Light Tobacco Smoker -- 0.25 packs/day for 4 years    Types: Cigarettes  . Smokeless tobacco: Never Used  . Alcohol Use: Yes     Comment: socially   OB History   Grav Para Term Preterm Abortions TAB SAB Ect Mult Living   3 2 2  0 1 1 0 0 0 2     Review of Systems  Constitutional: Negative for fever and chills.  HENT: Positive for dental problem. Negative for sore throat, tinnitus, trouble swallowing and voice change.   Respiratory: Negative for cough and wheezing.   Gastrointestinal: Negative for nausea, abdominal pain and diarrhea.  Musculoskeletal: Positive for arthralgias and gait problem.    Allergies  Penicillins  Home Medications   Current Outpatient Rx  Name  Route  Sig  Dispense  Refill  . albuterol (PROVENTIL HFA;VENTOLIN HFA) 108 (90 BASE) MCG/ACT inhaler   Inhalation   Inhale 2 puffs into the lungs every 6 (six) hours as needed for wheezing.         Marland Kitchen guaiFENesin (MUCINEX) 600 MG 12 hr tablet   Oral   Take 1 tablet (600 mg total) by mouth 2 (two) times daily.   60 tablet   0   . ibuprofen (ADVIL,MOTRIN) 200 MG tablet   Oral   Take 800 mg by mouth every 6 (six) hours as needed.         Marland Kitchen lisinopril-hydrochlorothiazide (PRINZIDE,ZESTORETIC) 20-25 MG per tablet   Oral   Take 1 tablet by mouth daily.   30 tablet   0   . HYDROcodone-acetaminophen (NORCO/VICODIN) 5-325 MG per tablet   Oral   Take 1 tablet by mouth every 4  (four) hours as needed.   10 tablet   0   . sulfamethoxazole-trimethoprim (SEPTRA DS) 800-160 MG per tablet   Oral   Take 1 tablet by mouth 2 (two) times daily.   14 tablet   0    BP 176/107  Pulse 75  Temp(Src) 98.2 F (36.8 C) (Oral)  Resp 18  SpO2 97%  LMP 01/19/2013 Physical Exam  Nursing note and vitals reviewed. Constitutional: She is oriented to person, place, and time. She appears well-developed and well-nourished.  HENT:  Head: Normocephalic and atraumatic.  Mouth/Throat: No trismus in the jaw. Abnormal dentition. Dental caries present. No dental abscesses. No oropharyngeal exudate, posterior oropharyngeal edema or posterior oropharyngeal erythema.    Cavity to the mesial surface of the Right lower 2nd premolar.   Tender to palpation.  No obvious abscess. Top and bottom teeth with sever erosion.  Multiple missing teeth. No signs of ludwig angina.  Eyes: EOM are normal. No scleral icterus.  Neck: Normal range of motion. Neck supple.  Multiple abrasions to the anterior neck.  Cardiovascular: Normal rate.   Pulmonary/Chest: Effort normal and breath sounds normal. She has no wheezes. She has no rales.  Abdominal: Soft.  Musculoskeletal:       Left shoulder: She exhibits tenderness and crepitus. She exhibits normal range of motion and no swelling.  Left should with full ROM. NV intact.  Neurological: She is alert and oriented to person, place, and time.  Skin: Skin is warm.  Psychiatric: She has a normal mood and affect. Her behavior is normal.    ED Course  Procedures (including critical care time) Labs Review Labs Reviewed - No data to display Imaging Review No results found.  EKG Interpretation   None       MDM   1. Dental cavity   2. Abrasions of multiple sites   3. Shoulder pain, left    Pt with a history of dental pain and was evaluated a month ago for similar complaints.  Left shoulder pain likely arthritis or rotator cuff injury, will treat for  pain and follow up with ortho. Will treat for pain and give antibiotics.  Will attempt dental block. Main pharmacy is out of the dental block medication. Discussed treatment plan with the patient. Return precautions given. Reports understanding and no other concerns at this time.  Patient is stable for discharge at this time.  Meds given in ED:  Medications - No data to display  Discharge Medication List as of 12/20/2013 11:02 AM    START taking these medications   Details  HYDROcodone-acetaminophen (  NORCO/VICODIN) 5-325 MG per tablet Take 1 tablet by mouth every 4 (four) hours as needed., Starting 12/20/2013, Until Discontinued, Print    sulfamethoxazole-trimethoprim (SEPTRA DS) 800-160 MG per tablet Take 1 tablet by mouth 2 (two) times daily., Starting 12/20/2013, Until Discontinued, Print          Lorrine Kin, PA-C 12/23/13 [phone]

## 2013-12-20 NOTE — ED Notes (Signed)
PA at bedside.

## 2013-12-20 NOTE — ED Notes (Signed)
Pt complains of R lower tooth pain. Tooth visibly eroded and side of face swollen. Pt also has abrasions to neck where picture fell on it this am. No sob. Pt also complains L shoulder weakness for past year since shoulder reduced.

## 2013-12-20 NOTE — Discharge Instructions (Signed)
Call for a follow up appointment with a Family or Primary Care Provider.  °Return if Symptoms worsen.   °Take medication as prescribed.  ° ° °Emergency Department Resource Guide °1) Find a Doctor and Pay Out of Pocket °Although you won't have to find out who is covered by your insurance plan, it is a good idea to ask around and get recommendations. You will then need to call the office and see if the doctor you have chosen will accept you as a new patient and what types of options they offer for patients who are self-pay. Some doctors offer discounts or will set up payment plans for their patients who do not have insurance, but you will need to ask so you aren't surprised when you get to your appointment. ° °2) Contact Your Local Health Department °Not all health departments have doctors that can see patients for sick visits, but many do, so it is worth a call to see if yours does. If you don't know where your local health department is, you can check in your phone book. The CDC also has a tool to help you locate your state's health department, and many state websites also have listings of all of their local health departments. ° °3) Find a Walk-in Clinic °If your illness is not likely to be very severe or complicated, you may want to try a walk in clinic. These are popping up all over the country in pharmacies, drugstores, and shopping centers. They're usually staffed by nurse practitioners or physician assistants that have been trained to treat common illnesses and complaints. They're usually fairly quick and inexpensive. However, if you have serious medical issues or chronic medical problems, these are probably not your best option. ° °No Primary Care Doctor: °- Call [facility] at  832-8000 - they can help you locate a primary care doctor that  accepts your insurance, provides certain services, etc. °- Physician Referral Service- 1-800-533-3463 ° °Chronic Pain Problems: °Organization         Address  Phone    Notes  °[facility]  (336) 297-2271 Patients need to be referred by their primary care doctor.  ° °Medication Assistance: °Organization         Address  Phone   Notes  °[place] [facility] [address] °[place], [place] [address] (336) 641-8030 --Must be a resident of [place] °-- Must have NO insurance coverage whatsoever (no Medicaid/ Medicare, etc.) °-- The pt. MUST have a primary care doctor that directs their care regularly and follows them in the community °  °[facility]  (866) 331-1348   °[employer]  (888) 892-1162   ° °Agencies that provide inexpensive medical care: °Organization         Address  Phone   Notes  °[facility]  (336) 832-8035   °[facility]    (336) 832-7272   °[facility] [address] [place] [address] °[place], [place] [address] (336) 832-4777   °[facility] [address], °[place] (336) 271-4999   °[facility]    (336) 373-0678   °[facility]    (336) 272-1050   °[facility] ° [address],  Phone:  (336) 832-4444, Fax:  (336) 832-4440 Hours of Operation:  9 am - 6 pm, M-F.  Also accepts Medicaid/Medicare and self-pay.  °[facility] ° [address],    Phone: (336) 832-3150, Fax: (336) 832-3151. Hours of Operation:  8:30 am - 5:30 pm, M-F.  Also accepts Medicaid and self-pay.  °[facility] [place] [address], [place] Phone: (336) 878-6027   °[facility] [address], [place], [place] (336)723-1848, Ext. 123 Mondays & Thursdays: 7-9 AM.  First 15 patients are seen on a first come, first serve basis. °  ° °Medicaid-accepting [place] Providers: ° °Organization         Address  Phone   Notes  °[facility] [address], [place] (336) 641-2100 Also accepts self-pay patients.  °[facility]  [address], [place] ° (336) 856-9996   °[facility] [address] [place] [address], [place] (336) 288-8857   °[facility] [address] [place] [address], [place] (336) 299-7000   °Veita Bland [address], [place]  ° (336) 373-1557 Only accepts [place] Access Medicaid patients after they have their name applied to their card.  ° °Self-Pay (no insurance) in [place]: ° °Organization         Address  Phone   Notes  °Sickle Cell Patients, [facility] [address], [place] (336) 832-1970   °[facility] [address], [place] (336) 832-4400   °[facility] ° [address], [place] (336) 992-4800   °[facility]/Dr. Osei-Bonsu ° [address] [place] [address], [place] or [address], [place] (336) 841-8500 Phone number for both [place] and [place] locations is the same.  °[facility] [address], [place] (336) 299-0000   °[facility] [place] [address] [place] [address], [place] or [address] (336) 852-7530 °(336) 878-2260   °[facility] [address], [place] (336) 350-1642, phone; (336) 294-5005, fax Sees patients 1st and 3rd Saturday of every month.  Must not qualify for public or private insurance (i.e. Medicaid, Medicare, [place] Health Choice, Veterans' Benefits) • Household income should be no more than 200% of the poverty level •The clinic cannot treat you if you are pregnant or think you are pregnant • Sexually transmitted diseases are not treated at the clinic.  ° ° °Dental Care: °Organization         Address  Phone  Notes  °[facility] [facility] [address], [place] (336) 641-6152 Accepts children up to age 21 who are enrolled in Medicaid or [place] Health Choice; pregnant women with a Medicaid card; and children who have  applied for Medicaid or [place] Health Choice, but were declined, whose parents can pay a reduced fee at time of service.  °[facility] [place]  [address], [place] (336) 641-7733 Accepts children up to age 21 who are enrolled in Medicaid or [place] Health Choice; pregnant women with a Medicaid card; and children who have applied for Medicaid or [place] Health Choice, but were declined, whose parents can pay a reduced fee at time of service.  °[facility] ° [address],  (336) 641-4533 Patients are seen by appointment only. Walk-ins are not accepted. [facility] will see patients 18 years of age and older. °Monday - Tuesday (8am-5pm) °Most Wednesdays (8:30-5pm) °$30 per visit, cash only  °[facility] ° [address], [place] (336) 641-4533 Patients are seen by appointment only. Walk-ins are not accepted. [facility] will see patients 18 years of age and older. °One Wednesday Evening (Monthly: Volunteer Based).  $30 per visit, cash only  °[facility]  (919) 537-3737 for adults; Children under age 4, call Graduate Pediatric Dentistry at (919) 537-3956. Children aged 4-14, please call (919) 537-3737 to request a pediatric application. ° Dental services are provided in all areas of dental care including fillings, crowns and bridges, complete and partial dentures, implants, gum treatment, root canals, and extractions. Preventive care is also provided. Treatment is provided to both adults and children. °Patients are selected via a lottery and there is often a waiting list. °  °[facility] [address], °[place] ° (336) 763-8833 [email or web address] °  °[facility] [address], [place], [place] (336)723-1848, Ext. 123 Second and Fourth Thursday of each month, opens at 6:30 AM; Clinic ends at 9 AM.  Patients are seen on a first-come first-served basis, and a  limited number are seen during each clinic.  ° °[facility] ° [address], [place], [place] (336) 723-7904   Eligibility Requirements °You must have lived in [place], [place], or [place] counties for at least the last three months. °  You cannot be eligible for state or federal sponsored healthcare insurance, including Veterans Administration, Medicaid, or Medicare. °  You generally cannot be eligible for healthcare insurance through your employer.  °  How to apply: °Eligibility screenings are held every Tuesday and Wednesday afternoon from 1:00 pm until 4:00 pm. You do not need an appointment for the interview!  °[facility] [address], [place], [place] 336-631-2330   °[facility]  336-342-8273   °[facility]  336-703-3100   °[facility]  336-570-6415   ° °Behavioral Health Resources in the Community: °Intensive Outpatient Programs °Organization         Address  Phone  Notes  °[facility] [address], [place], [place] 336-878-6098   °[facility] [address], [place], [place] 336-832-9800   °[facility] [address], [place], [place] ° 336-882-2125   °[facility] [address],  °[place], [place] 1-800-853-5163 or 336-641-4981   °Substance Abuse Resources °Organization         Address  Phone  Notes  °[facility]  336-882-2125   °[facility]  336-784-9470   °[facility]  336-285-9073   °[facility]  336-845-3988   °Residential & Outpatient Substance Abuse Program  1-800-659-3381   °Psychological Services °Organization         Address  Phone  Notes  °[facility]  336- 832-9600   °[facility]  336- 378-7881   °[facility] [address], [place] 1-800-853-5163 or 336-641-4981   ° °Mobile Crisis Teams °Organization          Address  Phone  Notes  °[facility], Mobile Crisis Care Unit  1-877-626-1772   °Assertive °[facility] ° [address] [place], [place] 336-834-9664   °Tajha DeEsch [address] °[place]  336-554-5454   ° °Self-Help/Support Groups °Organization         Address  Phone             Notes  °[facility] - variety of   support groups  336- 373-1402 Call for more information  °Narcotics Anonymous (NA), [facility] [address], °[place]  2 meetings at this location  ° °Residential Treatment Programs °Organization         Address  Phone  Notes  °[facility] [address],    °[place]  1-866-801-8205   °[facility] ° [address], [place], [place] 704-293-8524   °[facility] [address], [place] 336-845-3988 Admissions: 8am-3pm M-F  °[facility] [address]    °[place], [place] 336-841-1104   °[facility] [address], [place], [place] 336-379-7146   °[facility] [address]  °[place], [place] 336-285-9073   °[facility] Intensive Outpatient [address], [place], [place] 336-852-3033   °[facility] [address]  °[place], [place] 1-877-615-2722 or 336-784-9470   °[facility] [address] [place], [place] 336-227-7417 Accepts Medicaid  °[facility] [address]  °[place] 1-800-659-3381 Substance Abuse/Addiction Treatment  ° °[place] Behavioral Health Resources °Organization         Address  Phone  Notes  °[facility]  (888) 581-9988   °Julie Brannon, PhD [address] [place], [place]   (336) 349-5553 or (336) 951-0000   °[facility]   [address] °[place], [place] (336) 349-4454   °[facility] [address], [place], [place] (336) 342-8316 Insurance/Medicaid/sponsorship  through [facility]  °[facility] [address]                                    [place], [place] (336) 342-8316 Therapy/tele-psych/case  °[facility] [address]  ° [place], [place] (336) 349-2233    °Dr. Arfeen  (336) 349-4544   °[facility] [place]  [employer] [place] [facility] 1) [address], [place] °2) [address], [place] °3)  [address], [place] (336) 349-3220 °(336) 342-7768 ° °(336) 342-8140   °[place] [facility] (336) 342-1394 or (336) 342-3537 (After Hours)    ° °

## 2013-12-23 NOTE — ED Provider Notes (Signed)
Medical screening examination/treatment/procedure(s) were performed by non-physician practitioner and as supervising physician I was immediately available for consultation/collaboration.  EKG Interpretation   None        Richarda Blade, MD 12/23/13 1615

## 2014-02-01 ENCOUNTER — Encounter (HOSPITAL_COMMUNITY): Payer: Self-pay | Admitting: Emergency Medicine

## 2014-02-01 ENCOUNTER — Emergency Department (HOSPITAL_COMMUNITY)
Admission: EM | Admit: 2014-02-01 | Discharge: 2014-02-01 | Disposition: A | Discharge status: Home / Self Care | Payer: No Typology Code available for payment source | Attending: Emergency Medicine | Admitting: Emergency Medicine

## 2014-02-01 DIAGNOSIS — Z8742 Personal history of other diseases of the female genital tract: Secondary | ICD-10-CM | POA: Insufficient documentation

## 2014-02-01 DIAGNOSIS — G8929 Other chronic pain: Secondary | ICD-10-CM | POA: Insufficient documentation

## 2014-02-01 DIAGNOSIS — R21 Rash and other nonspecific skin eruption: Secondary | ICD-10-CM

## 2014-02-01 DIAGNOSIS — M25511 Pain in right shoulder: Secondary | ICD-10-CM

## 2014-02-01 DIAGNOSIS — R209 Unspecified disturbances of skin sensation: Secondary | ICD-10-CM | POA: Insufficient documentation

## 2014-02-01 DIAGNOSIS — Z880 Allergy status to penicillin: Secondary | ICD-10-CM | POA: Insufficient documentation

## 2014-02-01 DIAGNOSIS — J45909 Unspecified asthma, uncomplicated: Secondary | ICD-10-CM | POA: Insufficient documentation

## 2014-02-01 DIAGNOSIS — Z862 Personal history of diseases of the blood and blood-forming organs and certain disorders involving the immune mechanism: Secondary | ICD-10-CM | POA: Insufficient documentation

## 2014-02-01 DIAGNOSIS — M25519 Pain in unspecified shoulder: Secondary | ICD-10-CM | POA: Insufficient documentation

## 2014-02-01 DIAGNOSIS — Z79899 Other long term (current) drug therapy: Secondary | ICD-10-CM | POA: Insufficient documentation

## 2014-02-01 DIAGNOSIS — L509 Urticaria, unspecified: Secondary | ICD-10-CM | POA: Insufficient documentation

## 2014-02-01 DIAGNOSIS — R202 Paresthesia of skin: Secondary | ICD-10-CM

## 2014-02-01 DIAGNOSIS — F17200 Nicotine dependence, unspecified, uncomplicated: Secondary | ICD-10-CM | POA: Insufficient documentation

## 2014-02-01 DIAGNOSIS — Z792 Long term (current) use of antibiotics: Secondary | ICD-10-CM | POA: Insufficient documentation

## 2014-02-01 HISTORY — DX: Pain in left shoulder: M25.512

## 2014-02-01 HISTORY — DX: Other chronic pain: G89.29

## 2014-02-01 MED ORDER — DIPHENHYDRAMINE-ZINC ACETATE 1-0.1 % EX CREA: TOPICAL_CREAM | Freq: Three times a day (TID) | CUTANEOUS | Status: DC | PRN

## 2014-02-01 MED ORDER — NAPROXEN 500 MG PO TABS: 500.0000 mg | ORAL_TABLET | Freq: Two times a day (BID) | ORAL | Status: DC

## 2014-02-01 NOTE — ED Notes (Addendum)
Pt c/o R arm numbness and tingling to R arm x 2 days.  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  She states if she leans her shoulder down, the feeling sometimes comes back .  Pt able to move limb without difficulty.  Pt also c/o rash that comes and goes.  Large red, pruritic rash to neck and arm.  Pt also c/o L shoulder pain (chronic).

## 2014-02-01 NOTE — ED Provider Notes (Signed)
CSN: [id number]     Arrival date & time 02/01/14  1257 History  This chart was scribed for non-physician practitioner, Alvina Chou, PA-C working with Saddie Benders. Dorna Mai, MD by Frederich Balding, ED scribe. This patient was seen in room TR11C/TR11C and the patient's care was started at 2:44 PM.   Chief Complaint  Patient presents with  . Numbness  . Rash   The history is provided by the patient. No language interpreter was used.   HPI Comments: Katie Doyle is a 48 y.o. female who presents to the Emergency Department complaining of intermittent numbness in her right hand that started 2 days ago. Movement worsens the numbness and tingling. She states she started a new job one week ago where she does a lot of repetitive movement. Denies hand pain. She states she has chronic left shoulder pain that has been bothering her lately. Pt states she also has an intermittent, itchy rash to her neck and arm that started several months ago. She has used hydrocortisone cream with no relief.   Past Medical History  Diagnosis Date  . Fibroids   . Allergy   . SVD (spontaneous vaginal delivery) 1984    x 1  . Asthma     rarely uses inhaler  . Anemia   . Chronic pain in left shoulder 2013    post shoulder reduction   Past Surgical History  Procedure Laterality Date  . Appendectomy    . Cesarean section  1992    x 1  . Wisdom tooth extraction    . Abdominal hysterectomy N/A 05/14/2013    Procedure: TOTAL ABDOMINAL HYSTERECTOMY;  Surgeon: Woodroe Mode, MD;  Location: [facility];  Service: Gynecology;  Laterality: N/A;  . Salpingoophorectomy Bilateral 05/14/2013    Procedure: SALPINGO OOPHORECTOMY;  Surgeon: Woodroe Mode, MD;  Location: [facility];  Service: Gynecology;  Laterality: Bilateral;  . Cysto N/A 05/14/2013    Procedure: CYSTO;  Surgeon: Woodroe Mode, MD;  Location: [facility];  Service: Gynecology;  Laterality: N/A;  . Incision and drainage abscess N/A 05/23/2013    Procedure: INCISION AND DRAINAGE OF  ABDOMINAL ABSCESS;  Surgeon: Woodroe Mode, MD;  Location: [facility];  Service: Gynecology;  Laterality: N/A;   Family History  Problem Relation Age of Onset  . Diabetes Mother   . Hypertension Mother   . Cancer Father   . Diabetes Brother    History  Substance Use Topics  . Smoking status: Light Tobacco Smoker -- 0.25 packs/day for 4 years    Types: Cigarettes  . Smokeless tobacco: Never Used  . Alcohol Use: Yes     Comment: socially   OB History   Grav Para Term Preterm Abortions TAB SAB Ect Mult Living   3 2 2  0 1 1 0 0 0 2     Review of Systems  Musculoskeletal: Positive for arthralgias.  Skin: Positive for rash.  Neurological: Positive for numbness.  All other systems reviewed and are negative.   Allergies  Penicillins  Home Medications   Current Outpatient Rx  Name  Route  Sig  Dispense  Refill  . albuterol (PROVENTIL HFA;VENTOLIN HFA) 108 (90 BASE) MCG/ACT inhaler   Inhalation   Inhale 2 puffs into the lungs every 6 (six) hours as needed for wheezing.         Marland Kitchen guaiFENesin (MUCINEX) 600 MG 12 hr tablet   Oral   Take 1 tablet (600 mg total) by mouth 2 (two) times daily.  60 tablet   0   . HYDROcodone-acetaminophen (NORCO/VICODIN) 5-325 MG per tablet   Oral   Take 1 tablet by mouth every 4 (four) hours as needed.   10 tablet   0   . ibuprofen (ADVIL,MOTRIN) 200 MG tablet   Oral   Take 800 mg by mouth every 6 (six) hours as needed.         Marland Kitchen lisinopril-hydrochlorothiazide (PRINZIDE,ZESTORETIC) 20-25 MG per tablet   Oral   Take 1 tablet by mouth daily.   30 tablet   0   . sulfamethoxazole-trimethoprim (SEPTRA DS) 800-160 MG per tablet   Oral   Take 1 tablet by mouth 2 (two) times daily.   14 tablet   0    BP 178/99  Pulse 80  Temp(Src) 98.8 F (37.1 C) (Oral)  Resp 18  Wt 230 lb (104.327 kg)  SpO2 99%  LMP 01/19/2013  Physical Exam  Nursing note and vitals reviewed. Constitutional: She is oriented to person, place, and time. She  appears well-developed and well-nourished. No distress.  HENT:  Head: Normocephalic and atraumatic.  Eyes: EOM are normal.  Neck: Neck supple. No tracheal deviation present.  Cardiovascular: Normal rate.   Pulmonary/Chest: Effort normal. No respiratory distress.  Musculoskeletal: Normal range of motion.  Full ROM of right shoulder. No obvious deformity of shoulder, elbow or wrist on the right.   Neurological: She is alert and oriented to person, place, and time.  Upper extremity strength and sensation intact.   Skin: Skin is warm and dry.  Urticarial lesions of right upper arm. No open wound.   Psychiatric: She has a normal mood and affect. Her behavior is normal.    ED Course  Procedures (including critical care time)  DIAGNOSTIC STUDIES: Oxygen Saturation is 99% on RA, normal by my interpretation.    COORDINATION OF CARE: 2:47 PM-Discussed treatment plan which includes an anti-inflammatory with pt at bedside and pt agreed to plan.   Labs Review Labs Reviewed - No data to display Imaging Review No results found.  EKG Interpretation   None       MDM   Final diagnoses:  Right shoulder pain  Paresthesia of right arm  Rash    2:52 PM Patient likely has shoulder pain from repeated shoulder motion of cutting pineapples, which is also likely causing paresthesias. No neurovascular compromise. Patient advised to follow up with Dr. Erlinda Hong. Patient's rash likely contact dermatitis.   I personally performed the services described in this documentation, which was scribed in my presence. The recorded information has been reviewed and is accurate.   Alvina Chou, PA-C 02/01/14 1456

## 2014-02-01 NOTE — Discharge Instructions (Signed)
Take Naprosyn as needed for pain. Use benadryl cream as needed for rash. Refer to attached documents for more information. Return to the ED with worsening or concerning symptoms.    Emergency Department Resource Guide 1) Find a Doctor and Pay Out of Pocket Although you won't have to find out who is covered by your insurance plan, it is a good idea to ask around and get recommendations. You will then need to call the office and see if the doctor you have chosen will accept you as a new patient and what types of options they offer for patients who are self-pay. Some doctors offer discounts or will set up payment plans for their patients who do not have insurance, but you will need to ask so you aren't surprised when you get to your appointment.  2) Contact Your Local Health Department Not all health departments have doctors that can see patients for sick visits, but many do, so it is worth a call to see if yours does. If you don't know where your local health department is, you can check in your phone book. The CDC also has a tool to help you locate your state's health department, and many state websites also have listings of all of their local health departments.  3) Find a [facility] If your illness is not likely to be very severe or complicated, you may want to try a walk in clinic. These are popping up all over the country in pharmacies, drugstores, and shopping centers. They're usually staffed by nurse practitioners or physician assistants that have been trained to treat common illnesses and complaints. They're usually fairly quick and inexpensive. However, if you have serious medical issues or chronic medical problems, these are probably not your best option.  No Primary Care Doctor: - Call Health Connect at  [phone] - they can help you locate a primary care doctor that  accepts your insurance, provides certain services, etc. - Physician Referral Service- [phone]  Chronic Pain  Problems: Organization         Address  Phone   Notes  [facility]  [phone] Patients need to be referred by their primary care doctor.   Medication Assistance: Organization         Address  Phone   Notes  [facility] Medication [facility] [address] [place], [place] [address] [phone] --Must be a resident of [facility] -- Must have NO insurance coverage whatsoever (no Medicaid/ Medicare, etc.) -- The pt. MUST have a primary care doctor that directs their care regularly and follows them in the community   [facility]ist  [phone]   [employer]  [phone]    Agencies that provide inexpensive medical care: Organization         Address  Phone   Notes  [place]  [phone]   [facility]    [phone]   [facility] [place],  [address] [phone]   [place] [address], [place] [phone]   Planned Parenthood    [phone]   [facility]    [phone]   [place] and [address], [place] Phone:  [phone], Fax:  [phone] Hours of Operation:  9 am - 6 pm, M-F.  Also accepts Medicaid/Medicare and self-pay.  [facility] for [employer]  [place], [address], [place] Phone: [phone], Fax: [phone]. Hours of Operation:  8:30 am - 5:30 pm, M-F.  Also accepts Medicaid and self-pay.  [facility] [place] [address], [place] Phone: [phone]   [place], [place], [place] [phone], Ext. [phone] Mondays & Thursdays: 7-9 AM.  First 15 patients are seen on a first come, first serve basis.    [place] Providers:  Organization         Address  Phone   Notes  [facility] [address], [place] [phone] Also  accepts self-pay patients.  [facility] [address], [place]  [phone]   [place], [address], [place] [phone]   [facility] [address] [place] [phone]   Lucianne Lei [address], [place]   [phone] Only accepts [place] Access [place] patients after they have their name applied to their card.   Self-Pay (no insurance) in [facility]:  Organization         Address  Phone   Notes  Sickle Cell Patients, [facility] [employer] [phone]   [facility] [place] [phone]   [facility] [place]  [place], [place], [place] [phone]   [facility]/Dr. Osei-Bonsu  [address] [place] or [address], [place] [phone] Phone number for both [place] and [place] locations is the same.  [facility] [address], [place] [phone]   [facility] [address] [place] or [address] [phone]   [facility] [address] [place] [phone], phone; [phone], fax Sees patients 1st and 3rd Saturday of every month.  Must not qualify for public or private insurance (i.e. Medicaid, Medicare, [place] Health Choice, Veterans' Benefits)  Household income should be no more than 200% of the poverty level The clinic cannot treat you if you are pregnant or think you are pregnant  Sexually transmitted diseases are not treated at the clinic.    Dental Care: Organization         Address  Phone  Notes  [facility] Department of [facility] [place] [phone] Accepts children up to age 58 who are enrolled in [place] or [place]; pregnant  women with a Medicaid card; and children who have applied for Medicaid or [place] Health Choice, but were declined, whose parents can pay a reduced fee at time of service.  [facility] Department of [facility]  [address], [place] [phone] Accepts children up to age 52 who are enrolled in [place] or [place]; pregnant women with a Medicaid card; and children who have applied for Medicaid or [place] Health Choice, but were declined, whose parents can pay a reduced fee at time of service.  [facility] Access PROGRAM  [place] [phone] Patients are seen by appointment only. Walk-ins are not accepted. [place] will see patients 8 years of age and older. Monday - Tuesday (8am-5pm) Most Wednesdays (8:30-5pm) $30 per visit, cash only  [place]  PROGRAM  [address], [facility] [phone] Patients are seen by appointment only. Walk-ins are not accepted. [place] will see patients 73 years of age and older. One Wednesday Evening (Monthly: Volunteer Based).  $30 per visit, cash only  [place]  [phone] for adults; Children under age 67, call Graduate Pediatric Dentistry at [phone]. Children aged 69-14, please call [phone] to request a pediatric application.  Dental services are provided in all areas of dental care including fillings, crowns and bridges, complete and partial dentures, implants, gum treatment, root canals, and extractions. Preventive care is also provided. Treatment is provided to both adults and children. Patients are selected via a lottery and there is often a waiting list.   [facility] [address], [place]  [phone] [email or web address]   [facility] [address] [place], [place] [phone], Ext. [phone] Second and Fourth Thursday of each month, opens at 6:30 AM; Clinic ends at 9 AM.  Patients are  seen on a first-come first-served basis, and a limited number are seen during each clinic.   [facility]  [address] [place] D[place], [place] [phone]   Eligibility Requirements You must have lived in [place], [place], or [place] counties for at least the last three months.   You cannot be eligible for state or federal sponsored [employer], including [employer], [place], or [employer].   You generally cannot be eligible for healthcare insurance through your employer.    How to apply: Eligibility screenings are held every Tuesday and Wednesday afternoon from 1:00 pm until 4:00 pm. You do not need an appointment for the interview!  [facility] [address], [place], [place]   [place] Department  [place]  [phone]    Behavioral Health Resources in the Community: Intensive Outpatient Programs Organization         Address  Phone  Notes  [place]. [address], [place], [place] [phone]   [facility] Outpatient [address] [place], [place]   ADS: Alcohol & Drug Svcs [address], [place], [place]   [place] [address]  [place], [place] or [phone]   Substance Abuse Resources Organization         Address  Phone  Notes  Alcohol and Drug Services  [phone]   [place]  [phone]   [facility] [place]   Chinita Pester  [phone]   Residential & Outpatient Substance Abuse Program  [phone]   Psychological Services Organization         Address  Phone  Notes  [facility] [place]  [place]  [phone]   [place] [address], [place] or [phone]    Mobile Crisis  Teams Organization         Address  Phone  Notes  [facility], Mobile Crisis Care Unit  [phone]   [facility]  [address] [place], [place]   [place] [address], [place] [phone]    Self-Help/Support Groups Organization         Address  Phone             Notes  [place]  of [place] - variety of support groups  [place] Call for more information  Narcotics Anonymous (NA), [facility] [address], [employer] [place]  2 meetings at this location   Special educational needs teacher         Address  Phone  Notes  [facility]idential [facility] [place],    [place]  [phone]   [facility]  [address], [place] [address]4008, [place], [place]   [place], [place] [phone] Admissions: 8am-3pm M-F  Incentives Substance [place] [address],    [place], [place] [phone]   [facility] [address] [place], [place], [place]   [facility] [address]  [place], [place]   [facility] Programs - Intensive Outpatient [address]een [address], [place], [place]   [facility] ([place] [address].,  [place], [place] [phone] or [phone]   [facility] Services (RTS) [address], [place], [place] Accepts Medicaid  [facility] [place] [address],  [place] [phone] Substance Abuse/Addiction Treatment   [facility] Organization         Address  Phone  Notes  [facility]  [phone]   Domenic Schwab, PhD [address] [place], [place]   [phone] or [phone]   [facility] [place], [place] [phone]   [facility] [address], [place], [place] [phone]  Insurance/Medicaid/sponsorship through [facility] and Families [address]                                    [place], [place] [phone] [place] [address], [place] [phone]    Dr. Adele Schilder  [phone]   [facility] [place] Dept. 1) [address]  2) [place] 3)  [place] [address], [place] [phone]  [phone]   [place] [phone] or [phone] (After Hours)

## 2014-02-05 NOTE — ED Provider Notes (Signed)
Medical screening examination/treatment/procedure(s) were performed by non-physician practitioner and as supervising physician I was immediately available for consultation/collaboration.   Saddie Benders. Amariona Rathje, MD 02/05/14 1501

## 2014-02-14 ENCOUNTER — Emergency Department (HOSPITAL_COMMUNITY): Payer: Self-pay

## 2014-02-14 ENCOUNTER — Emergency Department (HOSPITAL_COMMUNITY)
Admission: EM | Admit: 2014-02-14 | Discharge: 2014-02-14 | Disposition: A | Discharge status: Home / Self Care | Payer: Self-pay | Attending: Emergency Medicine | Admitting: Emergency Medicine

## 2014-02-14 ENCOUNTER — Encounter (HOSPITAL_COMMUNITY): Payer: Self-pay | Admitting: Emergency Medicine

## 2014-02-14 DIAGNOSIS — D649 Anemia, unspecified: Secondary | ICD-10-CM | POA: Insufficient documentation

## 2014-02-14 DIAGNOSIS — Z79899 Other long term (current) drug therapy: Secondary | ICD-10-CM | POA: Insufficient documentation

## 2014-02-14 DIAGNOSIS — R197 Diarrhea, unspecified: Secondary | ICD-10-CM | POA: Insufficient documentation

## 2014-02-14 DIAGNOSIS — J45901 Unspecified asthma with (acute) exacerbation: Secondary | ICD-10-CM | POA: Insufficient documentation

## 2014-02-14 DIAGNOSIS — F17200 Nicotine dependence, unspecified, uncomplicated: Secondary | ICD-10-CM | POA: Insufficient documentation

## 2014-02-14 DIAGNOSIS — M25539 Pain in unspecified wrist: Secondary | ICD-10-CM | POA: Insufficient documentation

## 2014-02-14 DIAGNOSIS — Z791 Long term (current) use of non-steroidal anti-inflammatories (NSAID): Secondary | ICD-10-CM | POA: Insufficient documentation

## 2014-02-14 DIAGNOSIS — J069 Acute upper respiratory infection, unspecified: Secondary | ICD-10-CM | POA: Insufficient documentation

## 2014-02-14 DIAGNOSIS — G8929 Other chronic pain: Secondary | ICD-10-CM | POA: Insufficient documentation

## 2014-02-14 DIAGNOSIS — Z880 Allergy status to penicillin: Secondary | ICD-10-CM | POA: Insufficient documentation

## 2014-02-14 DIAGNOSIS — Z8742 Personal history of other diseases of the female genital tract: Secondary | ICD-10-CM | POA: Insufficient documentation

## 2014-02-14 MED ORDER — HYDROCOD POLST-CHLORPHEN POLST 10-8 MG/5ML PO LQCR: 5.0000 mL | Freq: Two times a day (BID) | ORAL | Status: DC | PRN

## 2014-02-14 MED ORDER — ALBUTEROL SULFATE HFA 108 (90 BASE) MCG/ACT IN AERS
2.0000 | INHALATION_SPRAY | Freq: Once | RESPIRATORY_TRACT | Status: AC
  Administered 2014-02-14: 2 via RESPIRATORY_TRACT
  Filled 2014-02-14: qty 6.7

## 2014-02-14 MED ORDER — HYDROCOD POLST-CHLORPHEN POLST 10-8 MG/5ML PO LQCR
5.0000 mL | Freq: Once | ORAL | Status: AC
  Administered 2014-02-14: 5 mL via ORAL
  Filled 2014-02-14: qty 5

## 2014-02-14 NOTE — ED Provider Notes (Signed)
CSN: [id number]     Arrival date & time 02/14/14  1314 History   This chart was scribed for non-physician practitioner Mercy Moore, PA-C, working with Tanna Furry, MD, by Neta Ehlers, ED Scribe. This patient was seen in room WTR5/WTR5 and the patient's care was started at 4:41 PM. First MD Initiated Contact with Patient 02/14/14 1601     Chief Complaint  Patient presents with  . Cough  . Nasal Congestion  . Arm Pain    The history is provided by the patient. No language interpreter was used.   HPI Comments: Katie Doyle is a 48 y.o. female with a PMH of fibroids, asthma, anemia, and chronic left shoulder pain who presents to the Emergency Department complaining of a cough productive of clear sputum which has persisted for two days. No hemoptysis. The pt reports the cough has prevented her from sleeping well.  The cough has been associated with post-tussive SOB (not at rest), nasal congestion, PND an intermittent sore throat, and a fever of 100.1. In the ED her temperature is 98.5 F. Today she had an episode of diarrhea. She has treated the symptoms with Mucinex and Nyquil without relief. The pt denies chest pain, abdominal pain, emesis, or otalgia. She also complains of right forearm pain  which has persisted for several weeks. 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 No other injuries/trauma. No numbness/tingling, weakness, or loss of sensation. She denies a h/o DVT/PE/slotting, cancer, extended travel, or estrogen pills. The pt is a former smoker.    Past Medical History  Diagnosis Date  . Fibroids   . Allergy   . SVD (spontaneous vaginal delivery) 1984    x 1  . Asthma     rarely uses inhaler  . Anemia   . Chronic pain in left shoulder 2013    post shoulder reduction   Past Surgical History  Procedure Laterality Date  . Appendectomy    . Cesarean section  1992    x 1  . Wisdom tooth extraction    . Abdominal hysterectomy  N/A 05/14/2013    Procedure: TOTAL ABDOMINAL HYSTERECTOMY;  Surgeon: Woodroe Mode, MD;  Location: [facility];  Service: Gynecology;  Laterality: N/A;  . Salpingoophorectomy Bilateral 05/14/2013    Procedure: SALPINGO OOPHORECTOMY;  Surgeon: Woodroe Mode, MD;  Location: [facility];  Service: Gynecology;  Laterality: Bilateral;  . Cysto N/A 05/14/2013    Procedure: CYSTO;  Surgeon: Woodroe Mode, MD;  Location: [facility];  Service: Gynecology;  Laterality: N/A;  . Incision and drainage abscess N/A 05/23/2013    Procedure: INCISION AND DRAINAGE OF ABDOMINAL ABSCESS;  Surgeon: Woodroe Mode, MD;  Location: [facility];  Service: Gynecology;  Laterality: N/A;   Family History  Problem Relation Age of Onset  . Diabetes Mother   . Hypertension Mother   . Cancer Father   . Diabetes Brother    History  Substance Use Topics  . Smoking status: Light Tobacco Smoker -- 0.25 packs/day for 4 years    Types: Cigarettes  . Smokeless tobacco: Never Used  . Alcohol Use: Yes     Comment: socially   OB History   Grav Para Term Preterm Abortions TAB SAB Ect Mult Living   3 2 2  0 1 1 0 0 0 2     Review of Systems  Constitutional: Positive for fever and fatigue. Negative for chills, diaphoresis, activity change and appetite change.  HENT: Positive for congestion, postnasal drip, rhinorrhea and sore throat. Negative for ear pain and trouble swallowing.   Respiratory: Positive for cough and shortness of breath (with coughing). Negative for chest tightness and wheezing.   Cardiovascular: Negative for chest pain and leg swelling.  Gastrointestinal: Positive for diarrhea. Negative for nausea, vomiting, abdominal pain and constipation.  Musculoskeletal: Positive for myalgias. Negative for back pain, neck pain and neck stiffness.  Skin: Negative for rash.  Neurological: Negative for dizziness, weakness, numbness and headaches.  All other systems reviewed and are negative.   Allergies  Penicillins  Home Medications    Current Outpatient Rx  Name  Route  Sig  Dispense  Refill  . albuterol (PROVENTIL HFA;VENTOLIN HFA) 108 (90 BASE) MCG/ACT inhaler   Inhalation   Inhale 2 puffs into the lungs every 6 (six) hours as needed for wheezing.         . diphenhydrAMINE-zinc acetate (BENADRYL ITCH STOPPING) cream   Topical   Apply topically 3 (three) times daily as needed for itching.   28.3 g   0   . ferrous sulfate 325 (65 FE) MG EC tablet   Oral   Take 325 mg by mouth daily with breakfast.         . guaiFENesin (MUCINEX) 600 MG 12 hr tablet   Oral   Take 1,200 mg by mouth 2 (two) times daily as needed for cough.         Marland Kitchen lisinopril-hydrochlorothiazide (PRINZIDE,ZESTORETIC) 20-25 MG per tablet   Oral   Take 1 tablet by mouth daily.   30 tablet   0   . naproxen (NAPROSYN) 500 MG tablet   Oral   Take 1 tablet (500 mg total) by mouth 2 (two) times daily with a meal.   30 tablet   0    Triage Vitals: BP 145/94  Pulse 87  Temp(Src) 99.1 F (37.3 C) (Oral)  Resp 20  SpO2 98%  LMP 01/19/2013  Filed Vitals:   02/14/14 1332 02/14/14 1438 02/14/14 1816  BP: 147/81 145/94 157/94  Pulse: 91 87 84  Temp: 98.5 F (36.9 C) 99.1 F (37.3 C)   TempSrc: Oral Oral   Resp: 18 20 20   SpO2: 97% 98% 99%    Physical Exam  Nursing note and vitals reviewed. Constitutional: She is oriented to person, place, and time. She appears well-developed and well-nourished. No distress.  HENT:  Head: Normocephalic and atraumatic.  Right Ear: External ear normal.  Left Ear: External ear normal.  Nose: Nose normal.  Mouth/Throat: Oropharynx is clear and moist. No oropharyngeal exudate.  Nasal congestion. No sinus tenderness throughout. Tympanic membranes gray and translucent bilaterally with no erythema, edema, or hemotympanum. No erythema to the posterior pharynx. Tonsils without edema or exudates. Uvula midline. No trismus. No difficulty controlling secretions.   Eyes: Conjunctivae and EOM are normal.  Pupils are equal, round, and reactive to light. Right eye exhibits no discharge. Left eye exhibits no discharge.  Neck: Normal range of motion. Neck supple. No tracheal deviation present.  No LAD or rigidity  Cardiovascular: Normal rate, regular rhythm, normal heart sounds and intact distal pulses.  Exam reveals no gallop and no friction rub.   No murmur heard. Radial pulses present and equal bilaterally  Pulmonary/Chest: Effort normal. No respiratory distress. She has wheezes. She has no rales. She exhibits no tenderness.  Intermittent expiratory wheeze which clears with coughing. Decreased breath sounds throughout. Patient coughing throughout exam.   Abdominal: Soft. She exhibits no  distension. There is no tenderness.  Musculoskeletal: Normal range of motion. She exhibits no edema and no tenderness.       Arms: Mild diffuse tenderness to palpation to the right forearm with no wounds, edema, ecchymosis or erythema. Strength 5/5 in the right arm. No tenderness to palpation to the right shoulder, wrist, elbow, or hand. No LE edema or calf tenderness bilaterally  Neurological: She is alert and oriented to person, place, and time.  Skin: Skin is warm and dry. She is not diaphoretic.  Psychiatric: She has a normal mood and affect. Her behavior is normal.    ED Course  Procedures (including critical care time)  DIAGNOSTIC STUDIES: Oxygen Saturation is 98% on room air, normal by my interpretation.    COORDINATION OF CARE:  4:49 PM- Discussed treatment plan with patient, which includes a chest x-ray and a breathing treatment, and the patient agreed to the plan. Also informed pt that she would be provided a prescription for a cough syrup.   Labs Review Labs Reviewed - No data to display Imaging Review Dg Chest 2 View  02/14/2014   CLINICAL DATA:  Chest pain, cough fever  EXAM: CHEST  2 VIEW  COMPARISON:  12/29/2012  FINDINGS: Cardiomediastinal silhouette is stable. No acute infiltrate or  pleural effusion. No pulmonary edema. Bony thorax is unremarkable.  IMPRESSION: No active cardiopulmonary disease.   Electronically Signed   By: Lahoma Crocker M.D.   On: 02/14/2014 18:00     EKG Interpretation None        MDM   Analeia Leiphart is a 48 y.o. female with a PMH of fibroids, asthma, anemia, and chronic left shoulder pain who presents to the Emergency Department complaining of a cough productive of clear sputum which has persisted for two days.    Rechecks  6:00 PM = Symptoms improved after albuterol inhaler and tussionex.    Etiology of symptoms likely due to a URI vs viral syndrome. Patient had improvements in symptoms with albuterol inhaler and tussionex. No hypoxia, tachypnea, or respiratory distress. Chest x-ray negative for an acute cardiopulmonary process. Encouraged to use inhaler at home. Patient afebrile and non-toxic in appearance. Encouraged to follow-up with PCP. Etiology of right forearm pain likely due to overuse muscular injury from work. RICE method discussed. Patient neurovascularly intact. Return precautions, discharge instructions, and follow-up was discussed with the patient before discharge.     Discharge Medication List as of 02/14/2014  6:16 PM    START taking these medications   Details  chlorpheniramine-HYDROcodone (TUSSIONEX PENNKINETIC ER) 10-8 MG/5ML LQCR Take 5 mLs by mouth every 12 (twelve) hours as needed for cough (Cough)., Starting 02/14/2014, Until Discontinued, Print         Final impressions: 1. URI (upper respiratory infection)       Denman George   I personally performed the services described in this documentation, which was scribed in my presence. The recorded information has been reviewed and is accurate.         Lucila Maine, PA-C 02/16/14 1439

## 2014-02-14 NOTE — Discharge Instructions (Signed)
Use albuterol inhaler 2 puffs every 4-6 hours for cough  Try Tussionex for cough - Please be careful with this medication.  It can cause drowsiness.  Use caution while driving, operating machinery, drinking alcohol, or any other activities that may impair your physical or mental abilities.   Return to the emergency department if you develop any changing/worsening condition, coughing up blood, difficulty breathing, fever, or any other concerns (please read additional information regarding your condition below)     Upper Respiratory Infection, Adult An upper respiratory infection (URI) is also known as the common cold. It is often caused by a type of germ (virus). Colds are easily spread (contagious). You can pass it to others by kissing, coughing, sneezing, or drinking out of the same glass. Usually, you get better in 1 or 2 weeks.  HOME CARE   Only take medicine as told by your doctor.  Use a warm mist humidifier or breathe in steam from a hot shower.  Drink enough water and fluids to keep your pee (urine) clear or pale yellow.  Get plenty of rest.  Return to work when your temperature is back to normal or as told by your doctor. You may use a face mask and wash your hands to stop your cold from spreading. GET HELP RIGHT AWAY IF:   After the first few days, you feel you are getting worse.  You have questions about your medicine.  You have chills, shortness of breath, or brown or red spit (mucus).  You have yellow or brown snot (nasal discharge) or pain in the face, especially when you bend forward.  You have a fever, puffy (swollen) neck, pain when you swallow, or white spots in the back of your throat.  You have a bad headache, ear pain, sinus pain, or chest pain.  You have a high-pitched whistling sound when you breathe in and out (wheezing).  You have a lasting cough or cough up blood.  You have sore muscles or a stiff neck. MAKE SURE YOU:   Understand these  instructions.  Will watch your condition.  Will get help right away if you are not doing well or get worse. Document Released: 05/21/2008 Document Revised: 02/25/2012 Document Reviewed: 04/09/2011 [facility] Patient Information 2014 [place], [place].   Emergency Department Resource Guide 1) Find a Doctor and Pay Out of Pocket Although you won't have to find out who is covered by your insurance plan, it is a good idea to ask around and get recommendations. You will then need to call the office and see if the doctor you have chosen will accept you as a new patient and what types of options they offer for patients who are self-pay. Some doctors offer discounts or will set up payment plans for their patients who do not have insurance, but you will need to ask so you aren't surprised when you get to your appointment.  2) Contact Your Local Health Department Not all health departments have doctors that can see patients for sick visits, but many do, so it is worth a call to see if yours does. If you don't know where your local health department is, you can check in your phone book. The CDC also has a tool to help you locate your state's health department, and many state websites also have listings of all of their local health departments.  3) Find a [facility] If your illness is not likely to be very severe or complicated, you may want to try a  walk in clinic. These are popping up all over the country in pharmacies, drugstores, and shopping centers. They're usually staffed by nurse practitioners or physician assistants that have been trained to treat common illnesses and complaints. They're usually fairly quick and inexpensive. However, if you have serious medical issues or chronic medical problems, these are probably not your best option.  No Primary Care Doctor: - Call Health Connect at  [phone] - they can help you locate a primary care doctor that  accepts your insurance, provides certain services,  etc. - Physician Referral Service- [phone]  Chronic Pain Problems: Organization         Address  Phone   Notes  [facility]  [phone] Patients need to be referred by their primary care doctor.   Medication Assistance: Organization         Address  Phone   Notes  [facility] Medication [facility] [place] [facility] [place] [place], [place] [address] [phone] --Must be a resident of [facility] -- Must have NO insurance coverage whatsoever (no Medicaid/ Medicare, etc.) -- The pt. MUST have a primary care doctor that directs their care regularly and follows them in the community   [facility]ist  [phone]   [employer]  [phone]    Agencies that provide inexpensive medical care: Organization         Address  Phone   Notes  [place]  [phone]   [facility]    [phone]   [facility] [place], [place] [address] [phone]   [place] [address] [place] [phone]   Planned Parenthood    [phone]   [facility]    [phone]   [place] and [place] [address], [place] Phone:  [phone], Fax:  [phone] Hours of Operation:  9 am - 6 pm, M-F.  Also accepts Medicaid/Medicare and self-pay.  [facility] for [place] [address], [address], [place] Phone: [phone], Fax: [phone]. Hours of Operation:  8:30 am - 5:30 pm, M-F.  Also accepts Medicaid and self-pay.  [facility] [address], [place] Phone: [phone]   [place], [place], [place] [phone], Ext. [phone] Mondays & Thursdays: 7-9 AM.  First 15 patients are seen on a first come, first serve basis.    [place]undred Providers:  Organization         Address  Phone   Notes  [facility] [address], [place] [phone] Also accepts self-pay patients.  [facility] [address], [place]  [phone]   [place], [address], [place] [phone]   [facility] [address] [place] [phone]   Lucianne Lei [address], [place]   [phone] Only accepts [place] Access [place] patients after they have their name applied to their card.   Self-Pay (no insurance) in [facility]:  Organization         Address  Phone   Notes  Sickle Cell Patients, [facility] [place] [phone]   [facility]gent Care [place] [phone]   Gershon Mussel  [facility] [place]  [address], [address], [place] [phone]   [facility]/Dr. Osei-Bonsu  [address], [place] or [address], [place] [phone] Phone number for both [place] and [place] locations is the same.  Urgent Medical and [facility] [address] [place] [phone]   [facility] [address] [place] or [address] [phone]   [facility] [address] [place] [phone], phone; [phone], fax Sees patients 1st and 3rd Saturday of every month.  Must not qualify for public or private insurance (i.e. Medicaid, Medicare, [place] Health Choice, Veterans' Benefits)  Household income should be no more than 200% of the poverty level The clinic cannot treat you if you are pregnant or think you are pregnant  Sexually transmitted diseases are not treated at the clinic.    Dental Care: Organization         Address  Phone  Notes  [facility] Department of [facility] [phone] Accepts children up to  age 76 who are enrolled in [place] or [place]; pregnant women with a Medicaid card; and children who have applied for Medicaid or [place] Health Choice, but were declined, whose parents can pay a reduced fee at time of service.  [facility] Department of [facility]  [address], [place] [phone] Accepts children up to age 23 who are enrolled in [place] or [place]; pregnant women with a Medicaid card; and children who have applied for Medicaid or [place] Health Choice, but were declined, whose parents can pay a reduced fee at time of service.  [place] Adult Dental Access PROGRAM  [place] [phone] Patients are seen by appointment only. Walk-ins are not accepted. [place] will see patients 8 years of age and older. Monday - Tuesday (8am-5pm) Most Wednesdays (8:30-5pm) $30 per visit, cash only  [facility] Adult Dental Access PROGRAM  [address], [facility] [phone] Patients are seen by appointment only. Walk-ins are not accepted. [place] will see patients 58 years of age and older. One Wednesday Evening (Monthly: Volunteer Based).  $30 per visit, cash only  [place]  [phone] for adults; Children under age 20, call Graduate Pediatric Dentistry at [phone]. Children aged 36-14, please call [phone] to request a pediatric application.  Dental services are provided in all areas of dental care including fillings, crowns and bridges, complete and partial dentures, implants, gum treatment, root canals, and extractions. Preventive care is also provided. Treatment is provided to both adults and children. Patients are selected via a lottery and there is often a waiting list.   [facility] [address], [place]  [phone] [email or web address]   [facility] [address] [place], [place] [phone], Ext. [phone] Second and Fourth Thursday of  each month, opens at 6:30 AM; Clinic ends at 9 AM.  Patients are seen on a first-come first-served basis, and a limited number are seen during each clinic.   [facility]  [address] Hillard Danker [place], [place] [phone]   Eligibility Requirements You must have lived in [place], [place], or [place] counties for at least the last three months.   You cannot be eligible for state or federal sponsored healthcare  insurance, including [employer], [place], or [employer].   You generally cannot be eligible for healthcare insurance through your employer.    How to apply: Eligibility screenings are held every Tuesday and Wednesday afternoon from 1:00 pm until 4:00 pm. You do not need an appointment for the interview!  [facility] [address], [place], [place]   [place]  [place] Department  [place]  [phone]    Behavioral Health Resources in the Community: Intensive Outpatient Programs Organization         Address  Phone  Notes  [place]. [address] [place], [place] [phone]   [facility] Outpatient [address] [place], [place]   ADS: Alcohol & Drug Svcs [address], [place], [place]   [place] [address],  [place], [place] or [phone]   Substance Abuse Resources Organization         Address  Phone  Notes  Alcohol and Drug Services  [phone]   [place]  [phone]   [place]   Chinita Pester  [phone]   Residential & Outpatient Substance Abuse Program  [phone]   Psychological Services Organization         Address  Phone  Notes  [facility] [place]  [place]  [phone]   [place] [address]  [place] or [phone]    Mobile Crisis Teams Organization         Address  Phone  Notes  [facility], Mobile Crisis Care Unit  [phone]   [facility]  [address] [place], [place]   [place] Levels [address] [place] [phone]    Self-Help/Support Groups Organization         Address  Phone             Notes  [place]. of [place] - variety of support groups  [place] Call for more information  Narcotics Anonymous (NA), [facility] [address], [employer]   2 meetings at this location   Special educational needs teacher         Address  Phone  Notes  [facility]idential Treatment [place],    [place]  [phone]   [facility]  [address] [place] [id number], [place], [place]   [place], [place] [phone] Admissions: 8am-3pm M-F  Incentives Substance [place] [address]    [place], [place] [phone]   [facility] [address] [place], [place], [place]   [facility] [address],  [place], [place]   [facility] Programs - Intensive Outpatient [address] Kristeen [address], [place], [place]   [facility] ([place].) [place].,  [place], [place] [phone] or [phone]   [facility] Services (RTS) [address] [place], [place] Accepts Medicaid  [facility] [place] [address],  [place] [phone] Substance Abuse/Addiction Treatment   [facility] Organization         Address  Phone  Notes  [facility]  [phone]   Domenic Schwab, PhD [address] [place], [place]   [phone] or [phone])  [place]   [place], [place] [phone]     [facility] [address], [place], [place] [phone] Insurance/Medicaid/sponsorship through [facility] and Families [address], [address] [place], [place] [phone] [place], [place] [phone]    Dr. Adele Schilder  [phone]   [facility] Dept. 1) [address] [place] 2) Central 3)  [place] [address], [place] [phone]  [phone]   [place] [phone] or [phone] (After Hours)

## 2014-02-14 NOTE — ED Notes (Signed)
Pt from home reports cough, nasal congestion x4 days. Pt taking mucinex with no relief. Pt adds that she is having R arm pain that she wants evaluated. Pt is A&O and in NAD

## 2014-02-24 NOTE — ED Provider Notes (Signed)
Medical screening examination/treatment/procedure(s) were performed by non-physician practitioner and as supervising physician I was immediately available for consultation/collaboration.   EKG Interpretation None        Tanna Furry, MD 02/24/14 [phone]

## 2014-09-16 DIAGNOSIS — B20 Human immunodeficiency virus [HIV] disease: Secondary | ICD-10-CM

## 2014-09-16 DIAGNOSIS — Z21 Asymptomatic human immunodeficiency virus [HIV] infection status: Secondary | ICD-10-CM

## 2014-09-16 HISTORY — DX: Human immunodeficiency virus (HIV) disease: B20

## 2014-09-16 HISTORY — DX: Asymptomatic human immunodeficiency virus (hiv) infection status: Z21

## 2014-10-15 ENCOUNTER — Encounter: Payer: Self-pay | Admitting: General Practice

## 2014-10-18 ENCOUNTER — Encounter (HOSPITAL_COMMUNITY): Payer: Self-pay | Admitting: Emergency Medicine

## 2014-12-07 LAB — T-HELPER CELLS (CD4) COUNT (NOT AT ARMC): Absolute CD4: 561

## 2014-12-07 LAB — HIV-1 RNA QUANT-NO REFLEX-BLD: HIV 1 RNA QUANT: 11390

## 2015-01-25 ENCOUNTER — Ambulatory Visit (INDEPENDENT_AMBULATORY_CARE_PROVIDER_SITE_OTHER): Payer: Self-pay

## 2015-01-25 DIAGNOSIS — Z23 Encounter for immunization: Secondary | ICD-10-CM

## 2015-01-25 DIAGNOSIS — F1911 Other psychoactive substance abuse, in remission: Secondary | ICD-10-CM

## 2015-01-25 DIAGNOSIS — B20 Human immunodeficiency virus [HIV] disease: Secondary | ICD-10-CM

## 2015-01-25 DIAGNOSIS — Z87828 Personal history of other (healed) physical injury and trauma: Secondary | ICD-10-CM

## 2015-01-25 LAB — URINALYSIS
BILIRUBIN URINE: NEGATIVE
Glucose, UA: NEGATIVE mg/dL
Hgb urine dipstick: NEGATIVE
KETONES UR: NEGATIVE mg/dL
Nitrite: NEGATIVE
PROTEIN: NEGATIVE mg/dL
Specific Gravity, Urine: 1.017 (ref 1.005–1.030)
UROBILINOGEN UA: 0.2 mg/dL (ref 0.0–1.0)
pH: 5.5 (ref 5.0–8.0)

## 2015-01-25 LAB — CBC WITH DIFFERENTIAL/PLATELET
Basophils Absolute: 0 10*3/uL (ref 0.0–0.1)
Basophils Relative: 0 % (ref 0–1)
EOS PCT: 4 % (ref 0–5)
Eosinophils Absolute: 0.2 10*3/uL (ref 0.0–0.7)
HCT: 34.7 % — ABNORMAL LOW (ref 36.0–46.0)
HEMOGLOBIN: 11.6 g/dL — AB (ref 12.0–15.0)
Lymphocytes Relative: 25 % (ref 12–46)
Lymphs Abs: 1.2 10*3/uL (ref 0.7–4.0)
MCH: 30.1 pg (ref 26.0–34.0)
MCHC: 33.4 g/dL (ref 30.0–36.0)
MCV: 89.9 fL (ref 78.0–100.0)
MONO ABS: 0.4 10*3/uL (ref 0.1–1.0)
MPV: 10.4 fL (ref 8.6–12.4)
Monocytes Relative: 8 % (ref 3–12)
NEUTROS ABS: 3.1 10*3/uL (ref 1.7–7.7)
NEUTROS PCT: 63 % (ref 43–77)
Platelets: 253 10*3/uL (ref 150–400)
RBC: 3.86 MIL/uL — AB (ref 3.87–5.11)
RDW: 16.2 % — ABNORMAL HIGH (ref 11.5–15.5)
WBC: 4.9 10*3/uL (ref 4.0–10.5)

## 2015-01-26 LAB — COMPLETE METABOLIC PANEL WITH GFR
ALBUMIN: 3.7 g/dL (ref 3.5–5.2)
ALT: 23 U/L (ref 0–35)
AST: 20 U/L (ref 0–37)
Alkaline Phosphatase: 116 U/L (ref 39–117)
BUN: 16 mg/dL (ref 6–23)
CHLORIDE: 105 meq/L (ref 96–112)
CO2: 28 mEq/L (ref 19–32)
Calcium: 8.9 mg/dL (ref 8.4–10.5)
Creat: 0.71 mg/dL (ref 0.50–1.10)
GFR, Est African American: >89 mL/min
GFR, Est Non African American: >89 mL/min
Glucose, Bld: 90 mg/dL (ref 70–99)
POTASSIUM: 3.9 meq/L (ref 3.5–5.3)
SODIUM: 138 meq/L (ref 135–145)
TOTAL PROTEIN: 7.7 g/dL (ref 6.0–8.3)
Total Bilirubin: 0.3 mg/dL (ref 0.2–1.2)

## 2015-01-26 LAB — LIPID PANEL
CHOL/HDL RATIO: 2.8 ratio
Cholesterol: 234 mg/dL — ABNORMAL HIGH (ref 0–200)
HDL: 83 mg/dL (ref >39)
LDL CALC: 140 mg/dL — AB (ref 0–99)
Triglycerides: 57 mg/dL (ref <150)
VLDL: 11 mg/dL (ref 0–40)

## 2015-01-26 LAB — HIV-1 RNA ULTRAQUANT REFLEX TO GENTYP+
HIV 1 RNA QUANT: 11911 {copies}/mL — AB (ref <20)
HIV-1 RNA QUANT, LOG: 4.08 {Log} — AB (ref <1.30)

## 2015-01-26 LAB — T-HELPER CELL (CD4) - (RCID CLINIC ONLY)
CD4 % Helper T Cell: 33 % (ref 33–55)
CD4 T Cell Abs: 410 /uL (ref 400–2700)

## 2015-01-26 LAB — HEPATITIS C ANTIBODY: HCV Ab: NEGATIVE

## 2015-01-26 LAB — URINE CYTOLOGY ANCILLARY ONLY
Chlamydia: NEGATIVE
Neisseria Gonorrhea: NEGATIVE

## 2015-01-26 LAB — RPR

## 2015-01-26 LAB — HEPATITIS B SURFACE ANTIGEN: Hepatitis B Surface Ag: NEGATIVE

## 2015-01-26 LAB — HEPATITIS A ANTIBODY, TOTAL: HEP A TOTAL AB: NONREACTIVE

## 2015-01-26 LAB — HEPATITIS B SURFACE ANTIBODY,QUALITATIVE: Hep B S Ab: NEGATIVE

## 2015-01-26 LAB — HEPATITIS B CORE ANTIBODY, TOTAL: Hep B Core Total Ab: NONREACTIVE

## 2015-01-27 DIAGNOSIS — F1911 Other psychoactive substance abuse, in remission: Secondary | ICD-10-CM | POA: Insufficient documentation

## 2015-01-27 DIAGNOSIS — Z87828 Personal history of other (healed) physical injury and trauma: Secondary | ICD-10-CM | POA: Insufficient documentation

## 2015-01-27 DIAGNOSIS — F1990 Other psychoactive substance use, unspecified, uncomplicated: Secondary | ICD-10-CM | POA: Insufficient documentation

## 2015-01-27 NOTE — Progress Notes (Signed)
Patient was diagnosed while in December 2015 while serving a  90 day prison sentence. She received counseling services and feels she is ready for HIV treatment.  No history of IV dug use and admits to sex with men only.  She states" it was probably one of those nasty men she was messing with" that infected her while she was using crack cocaine.   No medical records to request. No tattoos.  Vaccines updated  Laverle Patter, RN

## 2015-02-02 LAB — HIV-1 GENOTYPR PLUS

## 2015-02-02 LAB — HLA B*5701: HLA-B*5701 w/rflx HLA-B High: NEGATIVE

## 2015-02-09 ENCOUNTER — Ambulatory Visit: Payer: Self-pay | Admitting: Infectious Diseases

## 2015-03-03 ENCOUNTER — Ambulatory Visit: Payer: Self-pay | Admitting: Internal Medicine

## 2015-03-21 ENCOUNTER — Ambulatory Visit: Payer: Self-pay | Admitting: Internal Medicine

## 2015-03-25 ENCOUNTER — Emergency Department (HOSPITAL_COMMUNITY): Payer: Self-pay

## 2015-03-25 ENCOUNTER — Encounter (HOSPITAL_COMMUNITY): Payer: Self-pay | Admitting: Family Medicine

## 2015-03-25 ENCOUNTER — Emergency Department (HOSPITAL_COMMUNITY)
Admission: EM | Admit: 2015-03-25 | Discharge: 2015-03-25 | Disposition: A | Discharge status: Home / Self Care | Payer: Self-pay | Attending: Emergency Medicine | Admitting: Emergency Medicine

## 2015-03-25 DIAGNOSIS — J45909 Unspecified asthma, uncomplicated: Secondary | ICD-10-CM | POA: Insufficient documentation

## 2015-03-25 DIAGNOSIS — M545 Low back pain, unspecified: Secondary | ICD-10-CM

## 2015-03-25 DIAGNOSIS — Z791 Long term (current) use of non-steroidal anti-inflammatories (NSAID): Secondary | ICD-10-CM | POA: Insufficient documentation

## 2015-03-25 DIAGNOSIS — R05 Cough: Secondary | ICD-10-CM

## 2015-03-25 DIAGNOSIS — Z79899 Other long term (current) drug therapy: Secondary | ICD-10-CM | POA: Insufficient documentation

## 2015-03-25 DIAGNOSIS — D649 Anemia, unspecified: Secondary | ICD-10-CM | POA: Insufficient documentation

## 2015-03-25 DIAGNOSIS — G8929 Other chronic pain: Secondary | ICD-10-CM | POA: Insufficient documentation

## 2015-03-25 DIAGNOSIS — Z720 Tobacco use: Secondary | ICD-10-CM | POA: Insufficient documentation

## 2015-03-25 DIAGNOSIS — Z880 Allergy status to penicillin: Secondary | ICD-10-CM | POA: Insufficient documentation

## 2015-03-25 DIAGNOSIS — R059 Cough, unspecified: Secondary | ICD-10-CM

## 2015-03-25 DIAGNOSIS — Z8742 Personal history of other diseases of the female genital tract: Secondary | ICD-10-CM | POA: Insufficient documentation

## 2015-03-25 DIAGNOSIS — I10 Essential (primary) hypertension: Secondary | ICD-10-CM | POA: Insufficient documentation

## 2015-03-25 DIAGNOSIS — J069 Acute upper respiratory infection, unspecified: Secondary | ICD-10-CM | POA: Insufficient documentation

## 2015-03-25 HISTORY — DX: Essential (primary) hypertension: I10

## 2015-03-25 MED ORDER — CYCLOBENZAPRINE HCL 10 MG PO TABS
5.0000 mg | ORAL_TABLET | Freq: Once | ORAL | Status: AC
  Administered 2015-03-25: 5 mg via ORAL
  Filled 2015-03-25: qty 1

## 2015-03-25 MED ORDER — CYCLOBENZAPRINE HCL 5 MG PO TABS: 5.0000 mg | ORAL_TABLET | Freq: Two times a day (BID) | ORAL | Status: DC | PRN

## 2015-03-25 NOTE — Discharge Instructions (Signed)
Back Pain, Adult °Back pain is very common. The pain often gets better over time. The cause of back pain is usually not dangerous. Most people can learn to manage their back pain on their own.  °HOME CARE  °· Stay active. Start with short walks on flat ground if you can. Try to walk farther each day. °· Do not sit, drive, or stand in one place for more than 30 minutes. Do not stay in bed. °· Do not avoid exercise or work. Activity can help your back heal faster. °· Be careful when you bend or lift an object. Bend at your knees, keep the object close to you, and do not twist. °· Sleep on a firm mattress. Lie on your side, and bend your knees. If you lie on your back, put a pillow under your knees. °· Only take medicines as told by your doctor. °· Put ice on the injured area. °¨ Put ice in a plastic bag. °¨ Place a towel between your skin and the bag. °¨ Leave the ice on for 15-20 minutes, 03-04 times a day for the first 2 to 3 days. After that, you can switch between ice and heat packs. °· Ask your doctor about back exercises or massage. °· Avoid feeling anxious or stressed. Find good ways to deal with stress, such as exercise. °GET HELP RIGHT AWAY IF:  °· Your pain does not go away with rest or medicine. °· Your pain does not go away in 1 week. °· You have new problems. °· You do not feel well. °· The pain spreads into your legs. °· You cannot control when you poop (bowel movement) or pee (urinate). °· Your arms or legs feel weak or lose feeling (numbness). °· You feel sick to your stomach (nauseous) or throw up (vomit). °· You have belly (abdominal) pain. °· You feel like you may pass out (faint). °MAKE SURE YOU:  °· Understand these instructions. °· Will watch your condition. °· Will get help right away if you are not doing well or get worse. °Document Released: 05/21/2008 Document Revised: 02/25/2012 Document Reviewed: 04/06/2014 °ExitCare® Patient Information ©2015 [employer]. This information is not intended  to replace advice given to you by your health care provider. Make sure you discuss any questions you have with your health care provider. ° °

## 2015-03-25 NOTE — ED Notes (Signed)
Pt verbalized disappointment with care provided.  NP made aware.  RN explained reasoning behind prescription choice, and two options for follow up provided (Internal Medicine and [facility]).  Pt continues to express dissatisfaction with care.  RN encouraged pt to stay in room so NP could follow up.  Pt refused and walked to lobby.

## 2015-03-25 NOTE — ED Notes (Signed)
Per pt having right lower back pain. Denies injury. sts cough, chest congestion, mucous.

## 2015-03-25 NOTE — ED Provider Notes (Signed)
CSN: [id number]     Arrival date & time 03/25/15  1352 History  This chart was scribed for non-physician practitioner, Glendell Docker, NP working with Francine Graven, DO by Tula Nakayama, ED scribe. This patient was seen in room TR06C/TR06C and the patient's care was started at 2:12 PM   Chief Complaint  Patient presents with  . Back Pain  . URI   The history is provided by the patient. No language interpreter was used.   HPI Comments: Katie Doyle is a 49 y.o. female who presents to the Emergency Department complaining of constant, moderate chest congestion that started yesterday. She states productive cough as an associated symptom. Pt has used her albuterol inhaler with no relief. She denies fever.  Pt also complains of constant, moderate right-sided lower back pain that started 1 week ago. She has tried Ibuprofen and Tylenol with no relief. Pt denies recent injuries or falls. She also denies dysuria, urinary frequency and hematuria as associated symptoms.   Past Medical History  Diagnosis Date  . Fibroids   . Allergy   . SVD (spontaneous vaginal delivery) 1984    x 1  . Asthma     rarely uses inhaler  . Anemia   . Chronic pain in left shoulder 2013    post shoulder reduction  . Hypertension    Past Surgical History  Procedure Laterality Date  . Appendectomy    . Cesarean section  1992    x 1  . Wisdom tooth extraction    . Abdominal hysterectomy N/A 05/14/2013    Procedure: TOTAL ABDOMINAL HYSTERECTOMY;  Surgeon: Woodroe Mode, MD;  Location: [facility];  Service: Gynecology;  Laterality: N/A;  . Salpingoophorectomy Bilateral 05/14/2013    Procedure: SALPINGO OOPHORECTOMY;  Surgeon: Woodroe Mode, MD;  Location: [facility];  Service: Gynecology;  Laterality: Bilateral;  . Cysto N/A 05/14/2013    Procedure: CYSTO;  Surgeon: Woodroe Mode, MD;  Location: [facility];  Service: Gynecology;  Laterality: N/A;  . Incision and drainage abscess N/A 05/23/2013    Procedure: INCISION AND  DRAINAGE OF ABDOMINAL ABSCESS;  Surgeon: Woodroe Mode, MD;  Location: [facility];  Service: Gynecology;  Laterality: N/A;   Family History  Problem Relation Age of Onset  . Diabetes Mother   . Hypertension Mother   . Cancer Father   . Diabetes Brother    History  Substance Use Topics  . Smoking status: Light Tobacco Smoker -- 0.25 packs/day for 20 years    Types: Cigarettes  . Smokeless tobacco: Never Used  . Alcohol Use: 0.6 oz/week    1 Glasses of wine per week     Comment: socially   OB History    Gravida Para Term Preterm AB TAB SAB Ectopic Multiple Living   3 2 2  0 1 1 0 0 0 2     Review of Systems  Constitutional: Negative for fever.  HENT: Positive for congestion.   Respiratory: Positive for cough.   Genitourinary: Negative for dysuria, frequency and hematuria.  Musculoskeletal: Positive for back pain.  All other systems reviewed and are negative.   Allergies  Penicillins  Home Medications   Prior to Admission medications   Medication Sig Start Date End Date Taking? Authorizing Provider  albuterol (PROVENTIL HFA;VENTOLIN HFA) 108 (90 BASE) MCG/ACT inhaler Inhale 2 puffs into the lungs every 6 (six) hours as needed for wheezing.    Historical Provider, MD  chlorpheniramine-HYDROcodone (TUSSIONEX PENNKINETIC ER) 10-8 MG/5ML LQCR Take 5 mLs  by mouth every 12 (twelve) hours as needed for cough (Cough). Patient not taking: Reported on 01/25/2015 02/14/14   Lucila Maine, PA-C  diphenhydrAMINE-zinc acetate (BENADRYL ITCH STOPPING) cream Apply topically 3 (three) times daily as needed for itching. Patient not taking: Reported on 01/25/2015 02/01/14   Alvina Chou, PA-C  ferrous sulfate 325 (65 FE) MG EC tablet Take 325 mg by mouth daily with breakfast.    Historical Provider, MD  guaiFENesin (MUCINEX) 600 MG 12 hr tablet Take 1,200 mg by mouth 2 (two) times daily as needed for cough.    Historical Provider, MD  lisinopril-hydrochlorothiazide (PRINZIDE,ZESTORETIC) 20-25 MG  per tablet Take 1 tablet by mouth daily. 11/14/13   Hazel Sams, PA-C  naproxen (NAPROSYN) 500 MG tablet Take 1 tablet (500 mg total) by mouth 2 (two) times daily with a meal. Patient not taking: Reported on 01/25/2015 02/01/14   Kaitlyn Szekalski, PA-C   BP 151/87 mmHg  Pulse 90  Temp(Src) 98.7 F (37.1 C) (Oral)  Resp 20  SpO2 96%  LMP 01/19/2013 Physical Exam  Constitutional: She is oriented to person, place, and time. She appears well-developed and well-nourished. No distress.  HENT:  Head: Normocephalic and atraumatic.  Right Ear: External ear normal.  Left Ear: External ear normal.  Mouth/Throat: Oropharynx is clear and moist.  Eyes: Conjunctivae and EOM are normal. Pupils are equal, round, and reactive to light.  Neck: Neck supple. No tracheal deviation present.  Cardiovascular: Normal rate.   Pulmonary/Chest: Effort normal. No respiratory distress.  Musculoskeletal: Normal range of motion.  Right lumbar paraspinal tenderness  Neurological: She is alert and oriented to person, place, and time. Coordination normal.  Skin: Skin is warm and dry.  Psychiatric: She has a normal mood and affect. Her behavior is normal.  Nursing note and vitals reviewed.   ED Course  Procedures   DIAGNOSTIC STUDIES: Oxygen Saturation is 96% on RA, normal by my interpretation.    COORDINATION OF CARE: 2:15 PM Discussed treatment plan with pt at bedside and pt agreed to plan.  Labs Review Labs Reviewed - No data to display  Imaging Review Dg Chest 2 View  03/25/2015   CLINICAL DATA:  Cough and congestion for 1 day.  EXAM: CHEST  2 VIEW  COMPARISON:  02/2007 prior chest radiographs dating back to 10/30/2009.  FINDINGS: The cardiomediastinal silhouette is unremarkable.  Mild peribronchial thickening is unchanged.  There is no evidence of focal airspace disease, pulmonary edema, suspicious pulmonary nodule/mass, pleural effusion, or pneumothorax. No acute bony abnormalities are identified.   IMPRESSION: No evidence of acute cardiopulmonary disease.  Mild chronic peribronchial thickening.   Electronically Signed   By: Margarette Canada M.D.   On: 03/25/2015 15:13     EKG Interpretation None      MDM   Final diagnoses:  Cough  URI (upper respiratory infection)  Right-sided low back pain without sciatica    No pneumonia noted on x-ray.pt is neurologically intact.will treat symptomatically with flexeril:not having fevers  Pt cd count 410  I personally performed the services described in this documentation, which was scribed in my presence. The recorded information has been reviewed and is accurate.   Glendell Docker, NP 03/25/15 [place], DO 03/26/15 1606

## 2015-04-11 ENCOUNTER — Ambulatory Visit (INDEPENDENT_AMBULATORY_CARE_PROVIDER_SITE_OTHER): Payer: Self-pay | Admitting: Infectious Diseases

## 2015-04-11 ENCOUNTER — Encounter: Payer: Self-pay | Admitting: Infectious Diseases

## 2015-04-11 VITALS — BP 154/94 | HR 79 | Temp 98.2°F | Ht 65.0 in | Wt 253.0 lb

## 2015-04-11 DIAGNOSIS — B20 Human immunodeficiency virus [HIV] disease: Secondary | ICD-10-CM | POA: Insufficient documentation

## 2015-04-11 DIAGNOSIS — I10 Essential (primary) hypertension: Secondary | ICD-10-CM | POA: Insufficient documentation

## 2015-04-11 MED ORDER — EMTRICITAB-RILPIVIR-TENOFOV DF 200-25-300 MG PO TABS: 1.0000 | ORAL_TABLET | Freq: Every day | ORAL | Status: DC

## 2015-04-11 NOTE — Progress Notes (Signed)
   Subjective:    Patient ID: Katie Doyle, female    DOB: 05-Mar-1966, 49 y.o.   MRN: [id number]  HPI 49 yo F with HTN (no PCP) and HIV+. She was dx October 2015 and was lost to f/u as she had to travel to help family after her aunt died.  She got HIV tested as she had contact with sister in law, niece, brother who all had HIV. She was tested when she was incarcerated. Pt also has hx of substance abuse. Crack.  Previously noted facial asymmetry.  No hospitalizations except hysterectomy 2 years ago.   PMHx, Soc, FHx reviewed/updated.   Review of Systems  Constitutional: Negative for fever, chills, appetite change and unexpected weight change.  HENT: Negative for mouth sores.   Respiratory: Negative for shortness of breath.   Cardiovascular: Negative for chest pain.  Gastrointestinal: Negative for diarrhea and constipation.  Neurological: Negative for headaches.  has DOE, walking.   HIV 1 RNA QUANT  Date Value  01/25/2015 11911 copies/mL*  12/07/2014 11390   CD4 T CELL ABS (/uL)  Date Value  01/25/2015 410   Last PAP 10-2014 No prev mammogram. No prev colonoscopy.      Objective:   Physical Exam  Constitutional: She appears well-developed and well-nourished.  HENT:  Mouth/Throat: No oropharyngeal exudate.  Eyes: EOM are normal. Pupils are equal, round, and reactive to light.  Neck: Neck supple.  Cardiovascular: Normal rate, regular rhythm and normal heart sounds.   Pulmonary/Chest: Effort normal and breath sounds normal.  Abdominal: Soft. Bowel sounds are normal. She exhibits no distension. There is no tenderness.  Musculoskeletal: She exhibits no edema.  Lymphadenopathy:    She has no cervical adenopathy.  Neurological:  She has mild facial asymmetry, slight drop on L. This is old.       Assessment & Plan:

## 2015-04-11 NOTE — Assessment & Plan Note (Signed)
Will have her seen by PCP Suspect she may have had CVA in past.

## 2015-04-11 NOTE — Assessment & Plan Note (Signed)
Will set her up with PCP, encourage her to exercise, watch diet.

## 2015-04-11 NOTE — Assessment & Plan Note (Signed)
Will start her on complera. She knows to take with food.  Plan to change to descovy when it is available.  Will set her up for mammo Will set her up for PCP Will see her back in 6 weeks.  We spoke about clinical trial she was hesitant as she has to miss work to come to appt.

## 2015-04-14 ENCOUNTER — Other Ambulatory Visit: Payer: Self-pay | Admitting: Licensed Clinical Social Worker

## 2015-04-14 DIAGNOSIS — B20 Human immunodeficiency virus [HIV] disease: Secondary | ICD-10-CM

## 2015-04-14 MED ORDER — EMTRICITAB-RILPIVIR-TENOFOV DF 200-25-300 MG PO TABS: 1.0000 | ORAL_TABLET | Freq: Every day | ORAL | Status: DC

## 2015-05-10 ENCOUNTER — Other Ambulatory Visit: Payer: Self-pay | Admitting: *Deleted

## 2015-05-10 DIAGNOSIS — B20 Human immunodeficiency virus [HIV] disease: Secondary | ICD-10-CM

## 2015-05-10 MED ORDER — EMTRICITAB-RILPIVIR-TENOFOV DF 200-25-300 MG PO TABS: 1.0000 | ORAL_TABLET | Freq: Every day | ORAL | Status: DC

## 2015-05-10 NOTE — Telephone Encounter (Signed)
ADAP Application 

## 2015-05-26 ENCOUNTER — Emergency Department (HOSPITAL_COMMUNITY)
Admission: EM | Admit: 2015-05-26 | Discharge: 2015-05-27 | Disposition: A | Discharge status: Other Acute Inpatient Hospital | Payer: Self-pay | Attending: Emergency Medicine | Admitting: Emergency Medicine

## 2015-05-26 ENCOUNTER — Encounter (HOSPITAL_COMMUNITY): Payer: Self-pay | Admitting: Emergency Medicine

## 2015-05-26 ENCOUNTER — Emergency Department (HOSPITAL_COMMUNITY): Payer: Self-pay

## 2015-05-26 DIAGNOSIS — J45909 Unspecified asthma, uncomplicated: Secondary | ICD-10-CM | POA: Insufficient documentation

## 2015-05-26 DIAGNOSIS — F419 Anxiety disorder, unspecified: Secondary | ICD-10-CM | POA: Insufficient documentation

## 2015-05-26 DIAGNOSIS — F203 Undifferentiated schizophrenia: Secondary | ICD-10-CM | POA: Diagnosis present

## 2015-05-26 DIAGNOSIS — I10 Essential (primary) hypertension: Secondary | ICD-10-CM | POA: Insufficient documentation

## 2015-05-26 DIAGNOSIS — G8929 Other chronic pain: Secondary | ICD-10-CM | POA: Insufficient documentation

## 2015-05-26 DIAGNOSIS — Z86018 Personal history of other benign neoplasm: Secondary | ICD-10-CM | POA: Insufficient documentation

## 2015-05-26 DIAGNOSIS — Z3202 Encounter for pregnancy test, result negative: Secondary | ICD-10-CM | POA: Insufficient documentation

## 2015-05-26 DIAGNOSIS — R45850 Homicidal ideations: Secondary | ICD-10-CM

## 2015-05-26 DIAGNOSIS — B20 Human immunodeficiency virus [HIV] disease: Secondary | ICD-10-CM | POA: Insufficient documentation

## 2015-05-26 DIAGNOSIS — Z720 Tobacco use: Secondary | ICD-10-CM | POA: Insufficient documentation

## 2015-05-26 DIAGNOSIS — D649 Anemia, unspecified: Secondary | ICD-10-CM | POA: Insufficient documentation

## 2015-05-26 DIAGNOSIS — Z880 Allergy status to penicillin: Secondary | ICD-10-CM | POA: Insufficient documentation

## 2015-05-26 DIAGNOSIS — Z79899 Other long term (current) drug therapy: Secondary | ICD-10-CM | POA: Insufficient documentation

## 2015-05-26 DIAGNOSIS — F23 Brief psychotic disorder: Secondary | ICD-10-CM | POA: Insufficient documentation

## 2015-05-26 HISTORY — DX: Schizophrenia, unspecified: F20.9

## 2015-05-26 HISTORY — DX: Anxiety disorder, unspecified: F41.9

## 2015-05-26 HISTORY — DX: Major depressive disorder, single episode, unspecified: F32.9

## 2015-05-26 HISTORY — DX: Depression, unspecified: F32.A

## 2015-05-26 LAB — ETHANOL: Alcohol, Ethyl (B): 59 mg/dL — ABNORMAL HIGH (ref <5)

## 2015-05-26 LAB — COMPREHENSIVE METABOLIC PANEL
ALK PHOS: 184 U/L — AB (ref 38–126)
ALT: 20 U/L (ref 14–54)
AST: 29 U/L (ref 15–41)
Albumin: 4.5 g/dL (ref 3.5–5.0)
Anion gap: 13 (ref 5–15)
BUN: 19 mg/dL (ref 6–20)
CO2: 22 mmol/L (ref 22–32)
Calcium: 9.3 mg/dL (ref 8.9–10.3)
Chloride: 104 mmol/L (ref 101–111)
Creatinine, Ser: 1.3 mg/dL — ABNORMAL HIGH (ref 0.44–1.00)
GFR calc Af Amer: 56 mL/min — ABNORMAL LOW (ref >60)
GFR calc non Af Amer: 48 mL/min — ABNORMAL LOW (ref >60)
Glucose, Bld: 87 mg/dL (ref 65–99)
POTASSIUM: 3.5 mmol/L (ref 3.5–5.1)
Sodium: 139 mmol/L (ref 135–145)
Total Bilirubin: 0.6 mg/dL (ref 0.3–1.2)
Total Protein: 9.6 g/dL — ABNORMAL HIGH (ref 6.5–8.1)

## 2015-05-26 LAB — RAPID URINE DRUG SCREEN, HOSP PERFORMED
Amphetamines: NOT DETECTED
BARBITURATES: NOT DETECTED
Benzodiazepines: NOT DETECTED
COCAINE: POSITIVE — AB
OPIATES: NOT DETECTED
Tetrahydrocannabinol: POSITIVE — AB

## 2015-05-26 LAB — CBC
HCT: 43.3 % (ref 36.0–46.0)
Hemoglobin: 14.4 g/dL (ref 12.0–15.0)
MCH: 29.1 pg (ref 26.0–34.0)
MCHC: 33.3 g/dL (ref 30.0–36.0)
MCV: 87.5 fL (ref 78.0–100.0)
Platelets: 289 10*3/uL (ref 150–400)
RBC: 4.95 MIL/uL (ref 3.87–5.11)
RDW: 14.9 % (ref 11.5–15.5)
WBC: 9.5 10*3/uL (ref 4.0–10.5)

## 2015-05-26 LAB — SALICYLATE LEVEL: Salicylate Lvl: <4 mg/dL (ref 2.8–30.0)

## 2015-05-26 LAB — POC URINE PREG, ED: Preg Test, Ur: NEGATIVE

## 2015-05-26 LAB — ACETAMINOPHEN LEVEL: Acetaminophen (Tylenol), Serum: <10 ug/mL — ABNORMAL LOW (ref 10–30)

## 2015-05-26 MED ORDER — LORAZEPAM 1 MG PO TABS
1.0000 mg | ORAL_TABLET | Freq: Three times a day (TID) | ORAL | Status: DC | PRN
  Administered 2015-05-26 – 2015-05-27 (×2): 1 mg via ORAL
  Filled 2015-05-26 (×2): qty 1

## 2015-05-26 MED ORDER — LORAZEPAM 1 MG PO TABS
1.0000 mg | ORAL_TABLET | Freq: Once | ORAL | Status: AC
Start: 2015-05-26 — End: 2015-05-26
  Administered 2015-05-26: 1 mg via ORAL
  Filled 2015-05-26: qty 1

## 2015-05-26 MED ORDER — ACETAMINOPHEN 325 MG PO TABS
650.0000 mg | ORAL_TABLET | Freq: Four times a day (QID) | ORAL | Status: DC | PRN
  Administered 2015-05-26 – 2015-05-27 (×3): 650 mg via ORAL
  Filled 2015-05-26 (×2): qty 2

## 2015-05-26 NOTE — ED Notes (Signed)
Pt oriented to room and department.  Pt very tearful and wanting her mother.  Pt c/o voices telling her to hurt people but wasn't specific as to whom.  Pt was reassured of her safety.  Pt contracts for safety.  Fifteen minute checks in place.

## 2015-05-26 NOTE — ED Notes (Signed)
Off the floor for CT 

## 2015-05-26 NOTE — ED Notes (Signed)
Pt brought in by [employer] for hallucinations/voices that are telling her to hurt people that wont leave her alone or look at her like she is strange.  Pt states that she is talking to her mother, father, and grandmother who she knows are dead. Pt doesn't want to go to back to prison bc she has been there "plenty of times and I want to be around to watch my kids and my grandson grow up".

## 2015-05-26 NOTE — ED Provider Notes (Signed)
CSN: [id number]     Arrival date & time 05/26/15  1106 History   First MD Initiated Contact with Patient 05/26/15 1153     Chief Complaint  Patient presents with  . Homicidal  . Hallucinations     The history is provided by the patient. No language interpreter was used.   Katie Doyle presents for hallucinations.  She states she has been having unnatural thoughts in her head for the last two days.  They are telling her to hurt people and poison the person that hurt her. She called [facility] for help and [employer] brought her in for evaluation.  She wants the voices to leave her alone.  She denies SI but is afraid she may harm someone.  Sxs are severe, constant, worsening.  She has a hx/o HTN, HIV (diagnosed in January, not currently on meds).  She has been told previously that she is bipolar but has not been in a psychiatric hospital previously and does not take any psychiatric meds.  She smokes and drinks alcohol.  She has a hx/o crack use, last used yesterday.    Past Medical History  Diagnosis Date  . Fibroids   . Allergy   . SVD (spontaneous vaginal delivery) 1984    x 1  . Asthma     rarely uses inhaler  . Anemia   . Chronic pain in left shoulder 2013    post shoulder reduction  . Hypertension   . HIV (human immunodeficiency virus infection) 09-2014   Past Surgical History  Procedure Laterality Date  . Appendectomy    . Cesarean section  1992    x 1  . Wisdom tooth extraction    . Abdominal hysterectomy N/A 05/14/2013    Procedure: TOTAL ABDOMINAL HYSTERECTOMY;  Surgeon: Woodroe Mode, MD;  Location: [facility];  Service: Gynecology;  Laterality: N/A;  . Salpingoophorectomy Bilateral 05/14/2013    Procedure: SALPINGO OOPHORECTOMY;  Surgeon: Woodroe Mode, MD;  Location: [facility];  Service: Gynecology;  Laterality: Bilateral;  . Cysto N/A 05/14/2013    Procedure: CYSTO;  Surgeon: Woodroe Mode, MD;  Location: [facility];  Service: Gynecology;  Laterality: N/A;  . Incision and drainage abscess  N/A 05/23/2013    Procedure: INCISION AND DRAINAGE OF ABDOMINAL ABSCESS;  Surgeon: Woodroe Mode, MD;  Location: [facility];  Service: Gynecology;  Laterality: N/A;   Family History  Problem Relation Age of Onset  . Diabetes Mother   . Hypertension Mother   . Cancer Father     colon ~10 yo  . Diabetes Brother   . HIV Brother    History  Substance Use Topics  . Smoking status: Current Every Day Smoker -- 0.25 packs/day for 20 years    Types: Cigarettes  . Smokeless tobacco: Never Used  . Alcohol Use: 0.6 oz/week    1 Glasses of wine per week   OB History    Gravida Para Term Preterm AB TAB SAB Ectopic Multiple Living   3 2 2  0 1 1 0 0 0 2     Review of Systems  All other systems reviewed and are negative.     Allergies  Penicillins  Home Medications   Prior to Admission medications   Medication Sig Start Date End Date Taking? Authorizing Provider  albuterol (PROVENTIL HFA;VENTOLIN HFA) 108 (90 BASE) MCG/ACT inhaler Inhale 2 puffs into the lungs every 6 (six) hours as needed for wheezing.    Historical Provider, MD  cyclobenzaprine (FLEXERIL) 5  MG tablet Take 1 tablet (5 mg total) by mouth 2 (two) times daily as needed for muscle spasms. 03/25/15   Glendell Docker, NP  Emtricitab-Rilpivir-Tenofov DF 200-25-300 MG TABS Take 1 tablet by mouth daily. 05/10/15   Carlyle Basques, MD  ferrous sulfate 325 (65 FE) MG EC tablet Take 325 mg by mouth daily with breakfast.    Historical Provider, MD  lisinopril-hydrochlorothiazide (PRINZIDE,ZESTORETIC) 20-25 MG per tablet Take 1 tablet by mouth daily. 11/14/13   Peter Dammen, PA-C   BP 143/115 mmHg  Pulse 89  Temp(Src) 97.9 F (36.6 C) (Oral)  Resp 22  SpO2 97%  LMP 01/19/2013 Physical Exam  Constitutional: She is oriented to person, place, and time. She appears well-developed and well-nourished.  HENT:  Head: Normocephalic and atraumatic.  Cardiovascular: Normal rate and regular rhythm.   No murmur heard. Pulmonary/Chest: Effort  normal and breath sounds normal. No respiratory distress.  Abdominal: Soft. There is no tenderness. There is no rebound and no guarding.  Musculoskeletal: She exhibits no edema or tenderness.  Neurological: She is alert and oriented to person, place, and time.  Skin: Skin is warm and dry.  Psychiatric:  Anxious, pressured speech  Nursing note and vitals reviewed.   ED Course  Procedures (including critical care time) Labs Review Labs Reviewed  ACETAMINOPHEN LEVEL - Abnormal; Notable for the following:    Acetaminophen (Tylenol), Serum <10 (*)    All other components within normal limits  COMPREHENSIVE METABOLIC PANEL - Abnormal; Notable for the following:    Creatinine, Ser 1.30 (*)    Total Protein 9.6 (*)    Alkaline Phosphatase 184 (*)    GFR calc non Af Amer 48 (*)    GFR calc Af Amer 56 (*)    All other components within normal limits  ETHANOL - Abnormal; Notable for the following:    Alcohol, Ethyl (B) 59 (*)    All other components within normal limits  URINE RAPID DRUG SCREEN (HOSP PERFORMED) NOT AT [facility] - Abnormal; Notable for the following:    Cocaine POSITIVE (*)    Tetrahydrocannabinol POSITIVE (*)    All other components within normal limits  CBC  SALICYLATE LEVEL  T-HELPER CELLS (CD4) COUNT  HIV 1 RNA QUANT-NO REFLEX-BLD  POC URINE PREG, ED    Imaging Review Ct Head Wo Contrast  05/26/2015   CLINICAL DATA:  Homicidal.  Hallucinations.  EXAM: CT HEAD WITHOUT CONTRAST  TECHNIQUE: Contiguous axial images were obtained from the base of the skull through the vertex without intravenous contrast.  COMPARISON:  03/16/2005  FINDINGS: Skull and Sinuses:Negative for fracture or destructive process. The mastoids, middle ears, and imaged paranasal sinuses are clear.  Orbits: No acute abnormality.  Brain: No evidence of acute infarction, hemorrhage, hydrocephalus, or mass lesion/mass effect.  IMPRESSION: Negative head CT.   Electronically Signed   By: Monte Fantasia M.D.   On:  05/26/2015 14:10     EKG Interpretation None      MDM   Final diagnoses:  Psychotic episode  Homicidal ideation    Patient with history of alcohol and drug abuse here for acute psychotic episode. She does have a history of HIV and is not currently taking her antiretrovirals. She states this is due to cost.  Patient is not encephalopathic on examination. Repeat CD4 level obtained given patient not being on her antivirals and last check was in February. Clinical presentation is not consistent with meningitis or encephalitis. Patient has been medically cleared for psychiatric evaluation.  Quintella Reichert, MD 05/26/15 [phone]
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
## 2015-05-26 NOTE — ED Notes (Signed)
Lab called and ordered 2 more lavender tops of blood for this pt,it was collected  and sent. The lab was called and advised that the samples were in route spoke with Miss Larene Beach

## 2015-05-27 ENCOUNTER — Encounter (HOSPITAL_COMMUNITY): Payer: Self-pay | Admitting: *Deleted

## 2015-05-27 DIAGNOSIS — F203 Undifferentiated schizophrenia: Secondary | ICD-10-CM

## 2015-05-27 DIAGNOSIS — R45850 Homicidal ideations: Secondary | ICD-10-CM

## 2015-05-27 LAB — T-HELPER CELLS (CD4) COUNT (NOT AT ARMC)
CD4 % Helper T Cell: 34 % (ref 33–55)
CD4 T CELL ABS: 680 /uL (ref 400–2700)

## 2015-05-27 MED ORDER — FLUPHENAZINE HCL 5 MG PO TABS
5.0000 mg | ORAL_TABLET | Freq: Two times a day (BID) | ORAL | Status: DC
  Administered 2015-05-27 (×2): 5 mg via ORAL
  Filled 2015-05-27 (×2): qty 1

## 2015-05-27 MED ORDER — HYDROXYZINE HCL 25 MG PO TABS: 25.0000 mg | ORAL_TABLET | Freq: Three times a day (TID) | ORAL | Status: DC | PRN

## 2015-05-27 MED ORDER — BENZTROPINE MESYLATE 1 MG PO TABS
1.0000 mg | ORAL_TABLET | Freq: Two times a day (BID) | ORAL | Status: DC
  Administered 2015-05-27 (×2): 1 mg via ORAL
  Filled 2015-05-27 (×2): qty 1

## 2015-05-27 NOTE — [facility] Counselor (Signed)
Counselor faxed patient information/referral packet to the following facilites for review and consideration for placement:  [facility]- fax confirmation indicates received at 13:52 Contact [phone] for follow-up

## 2015-05-27 NOTE — ED Notes (Signed)
Report received from [facility]. Pt. Alert and oriented in no distress denies SI, HI, VH and pain. Pt. C/o hearing voices without command. Pt. Instructed to come to me with problems or concerns.Will continue to monitor for safety via security cameras and Q 15 minute checks.

## 2015-05-27 NOTE — ED Notes (Signed)
[employer] called back, stated they will be transporting patient to [facility], however it won't be until after 8 or 8:30 pm.  [facility] called at [phone] to give report, however they requested that we call back to give report after [employer] picks the patient up.   Lova Urbieta, Thornton Dales, RN

## 2015-05-27 NOTE — ED Notes (Signed)
Pt. Noted in room. No complaints or concerns voiced. No distress or abnormal behavior noted. Will continue to monitor with security cameras. Q 15 minute rounds continue. 

## 2015-05-27 NOTE — ED Notes (Signed)
[employer] called at [phone] regarding transportation to [facility]. No answer - message left for [employer] to call us back.   Ilma Achee, Thornton Dales, RN
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
## 2015-05-27 NOTE — ED Notes (Signed)
Patient is A&Ox4, denies SI/HI. Patient denies visual hallucinations but does admit to chronic auditory command hallucinations, but states she feels she currently has these under control as long as she starts praying whenever she hears them. Patient is pleasant, cooperative, and brightens on approach.    Noni Stonesifer, Thornton Dales, RN
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
## 2015-05-27 NOTE — BH Assessment (Addendum)
[facility] Assessment Progress Note  The following facilities have been contacted to seek placement for this pt, with results as noted:  Beds available, information sent, decision pending:  [facility]  At capacity:  [facility]:  [facility] (no high acuity beds)   Jalene Mullet, [place] [facility] Triage Specialist [phone]

## 2015-05-27 NOTE — BHH Counselor (Signed)
Janett Billow from [facility] called to inform of patient acceptance.  Dr. Michaelle Birks and Nurses can call Nurse Report at  [phone]

## 2015-05-30 LAB — HIV-1 RNA QUANT-NO REFLEX-BLD
HIV 1 RNA QUANT: 5480 {copies}/mL
LOG10 HIV-1 RNA: 3.739 log10copy/mL

## 2015-05-31 ENCOUNTER — Other Ambulatory Visit: Payer: Self-pay | Admitting: Licensed Clinical Social Worker

## 2015-05-31 DIAGNOSIS — B20 Human immunodeficiency virus [HIV] disease: Secondary | ICD-10-CM

## 2015-05-31 MED ORDER — EMTRICITAB-RILPIVIR-TENOFOV DF 200-25-300 MG PO TABS: 1.0000 | ORAL_TABLET | Freq: Every day | ORAL | Status: DC

## 2015-06-01 ENCOUNTER — Other Ambulatory Visit (INDEPENDENT_AMBULATORY_CARE_PROVIDER_SITE_OTHER): Payer: Self-pay

## 2015-06-01 DIAGNOSIS — B20 Human immunodeficiency virus [HIV] disease: Secondary | ICD-10-CM

## 2015-06-02 LAB — T-HELPER CELL (CD4) - (RCID CLINIC ONLY)
CD4 % Helper T Cell: 30 % — ABNORMAL LOW (ref 33–55)
CD4 T Cell Abs: 620 /uL (ref 400–2700)

## 2015-06-03 LAB — HIV-1 RNA QUANT-NO REFLEX-BLD
HIV 1 RNA Quant: 7775 copies/mL — ABNORMAL HIGH (ref <20)
HIV-1 RNA Quant, Log: 3.89 {Log} — ABNORMAL HIGH (ref <1.30)

## 2015-06-15 ENCOUNTER — Ambulatory Visit (INDEPENDENT_AMBULATORY_CARE_PROVIDER_SITE_OTHER): Payer: Self-pay | Admitting: Infectious Diseases

## 2015-06-15 ENCOUNTER — Encounter: Payer: Self-pay | Admitting: Infectious Diseases

## 2015-06-15 DIAGNOSIS — B20 Human immunodeficiency virus [HIV] disease: Secondary | ICD-10-CM

## 2015-06-15 DIAGNOSIS — I10 Essential (primary) hypertension: Secondary | ICD-10-CM

## 2015-06-15 NOTE — Progress Notes (Signed)
   Subjective:    Patient ID: Katie Doyle, female    DOB: June 12, 1966, 49 y.o.   MRN: [id number]  HPI 49 yo F with HTN (no PCP) and HIV+. She was dx October 2015 and was lost to f/u. She was re-tested when she was incarcerated. Pt also has hx of substance abuse (crack).   HIV 1 RNA QUANT (copies/mL)  Date Value  06/01/2015 7775*  05/26/2015 5480  01/25/2015 11911*   CD4 T CELL ABS (/uL)  Date Value  06/01/2015 620  05/26/2015 680  01/25/2015 410   Has been feeling well. Just started complera 4 days ago.  First day felt groggy but since fine. Checks BP at home, has been normal. Down to 2-3 cigarettes/day.  Previous hysterectomy.   Needs pap/mammo.   Review of Systems  Constitutional: Negative for appetite change and unexpected weight change.  Cardiovascular: Negative for chest pain.  Gastrointestinal: Negative for diarrhea and constipation.  Genitourinary: Negative for vaginal bleeding and difficulty urinating.  Neurological: Negative for headaches.       Objective:   Physical Exam  Constitutional: She appears well-developed and well-nourished.  HENT:  Mouth/Throat: No oropharyngeal exudate.  Eyes: EOM are normal. Pupils are equal, round, and reactive to light.  Neck: Neck supple.  Cardiovascular: Normal rate, regular rhythm and normal heart sounds.   Pulmonary/Chest: Effort normal and breath sounds normal.  Abdominal: Soft. Bowel sounds are normal. There is no tenderness.  Musculoskeletal: She exhibits no edema.  Lymphadenopathy:    She has no cervical adenopathy.       Assessment & Plan:

## 2015-06-15 NOTE — Assessment & Plan Note (Signed)
Will see her back in 2 months, repeat labs then. Consider change to TAF regimen (odefsy) Pap, mammo Offered/refused condoms.

## 2015-06-15 NOTE — Assessment & Plan Note (Signed)
Encouraged her to diet and exercise.  

## 2015-06-15 NOTE — Assessment & Plan Note (Signed)
Will continue to follow at home.  Will f/u in 2 months.

## 2015-07-15 ENCOUNTER — Telehealth: Payer: Self-pay | Admitting: *Deleted

## 2015-07-15 NOTE — Telephone Encounter (Signed)
RN received a return call from Ms. Katie Doyle stating the number listed for Katie Doyle is wrong and is in fact her number. Ms. Katie Doyle states she has had the same number for over 63yrs and on numerous occasions gets calls for Katie Doyle. Ms. Katie Doyle states she knows Katie Doyle has missed several appts because we continue to call her. Apologized for bothering Ms. Katie Doyle who was very polite about the situation. RN will update the pt's contact information

## 2015-07-15 NOTE — Telephone Encounter (Signed)
RN contacted the patient and left a message stating "My name and I am calling from hour Dr.'s office. We would like to know how you are doing and would like to offer you assistance with getting your medications" RN left my number as a return phone number for the patient. Purpose of this communication is to offer my services to help the pt adhere to her medications. During the pt's 05/26/15 ED visit she stated she cannot afford her medications and this is the reason why she does not take her HIV medications or Mental Health medications.

## 2015-08-01 ENCOUNTER — Other Ambulatory Visit (INDEPENDENT_AMBULATORY_CARE_PROVIDER_SITE_OTHER): Payer: Self-pay

## 2015-08-01 DIAGNOSIS — B20 Human immunodeficiency virus [HIV] disease: Secondary | ICD-10-CM

## 2015-08-02 LAB — HIV-1 RNA QUANT-NO REFLEX-BLD
HIV 1 RNA QUANT: 69 {copies}/mL — AB (ref <20)
HIV-1 RNA Quant, Log: 1.84 {Log} — ABNORMAL HIGH (ref <1.30)

## 2015-08-02 LAB — T-HELPER CELL (CD4) - (RCID CLINIC ONLY)
CD4 % Helper T Cell: 32 % — ABNORMAL LOW (ref 33–55)
CD4 T Cell Abs: 730 /uL (ref 400–2700)

## 2015-08-15 ENCOUNTER — Ambulatory Visit: Payer: Self-pay | Admitting: Infectious Diseases

## 2015-09-28 ENCOUNTER — Encounter: Payer: Self-pay | Admitting: Infectious Diseases

## 2015-09-28 ENCOUNTER — Ambulatory Visit (INDEPENDENT_AMBULATORY_CARE_PROVIDER_SITE_OTHER): Payer: Self-pay | Admitting: Infectious Diseases

## 2015-09-28 VITALS — BP 152/90 | HR 83 | Temp 98.4°F | Wt 246.0 lb

## 2015-09-28 DIAGNOSIS — Z79899 Other long term (current) drug therapy: Secondary | ICD-10-CM

## 2015-09-28 DIAGNOSIS — Z87898 Personal history of other specified conditions: Secondary | ICD-10-CM

## 2015-09-28 DIAGNOSIS — Z113 Encounter for screening for infections with a predominantly sexual mode of transmission: Secondary | ICD-10-CM

## 2015-09-28 DIAGNOSIS — F1911 Other psychoactive substance abuse, in remission: Secondary | ICD-10-CM

## 2015-09-28 DIAGNOSIS — B20 Human immunodeficiency virus [HIV] disease: Secondary | ICD-10-CM

## 2015-09-28 DIAGNOSIS — J209 Acute bronchitis, unspecified: Secondary | ICD-10-CM | POA: Insufficient documentation

## 2015-09-28 DIAGNOSIS — I10 Essential (primary) hypertension: Secondary | ICD-10-CM

## 2015-09-28 DIAGNOSIS — Z23 Encounter for immunization: Secondary | ICD-10-CM

## 2015-09-28 MED ORDER — CETIRIZINE HCL 10 MG PO CHEW: 10.0000 mg | CHEWABLE_TABLET | Freq: Every day | ORAL | Status: DC

## 2015-09-28 MED ORDER — AZITHROMYCIN 250 MG PO TABS: ORAL_TABLET | ORAL | Status: DC

## 2015-09-28 NOTE — Assessment & Plan Note (Signed)
Will get her in with PCP Avoid dextromethorphan Drug screen at f/u visit

## 2015-09-28 NOTE — Progress Notes (Signed)
   Subjective:    Patient ID: Katie Doyle, female    DOB: Jul 23, 1966, 49 y.o.   MRN: [id number]  HPI  49 yo F with HTN (no PCP) and HIV+. She was dx October 2015 and was lost to f/u. She was re-tested when she was incarcerated. Pt also has hx of substance abuse (crack).  Today complains of having the flu- has had cough for last 5 days. No f/c, but has felt cold and sweaty, clammy.  Cough prod yellow sputum, thick.  Taking complera without issues.   HIV 1 RNA QUANT (copies/mL)  Date Value  08/01/2015 69*  06/01/2015 7775*  05/26/2015 5480   CD4 T CELL ABS (/uL)  Date Value  08/01/2015 730  06/01/2015 620  05/26/2015 680     Review of Systems  Constitutional: Negative for fever, chills and appetite change.  HENT: Positive for congestion and postnasal drip.   Respiratory: Positive for cough. Negative for shortness of breath.   Gastrointestinal: Negative for diarrhea and constipation.  Genitourinary: Negative for difficulty urinating.  Neurological: Positive for headaches.       Objective:   Physical Exam  Constitutional: She appears well-developed and well-nourished.  HENT:  Mouth/Throat: No oropharyngeal exudate.  Eyes: EOM are normal. Pupils are equal, round, and reactive to light.  Neck: Neck supple.  Cardiovascular: Normal rate, regular rhythm and normal heart sounds.   Pulmonary/Chest: Effort normal. She has rhonchi.  Abdominal: Soft. Bowel sounds are normal. There is no tenderness. There is no rebound.  Lymphadenopathy:    She has no cervical adenopathy.      Assessment & Plan:

## 2015-09-28 NOTE — Assessment & Plan Note (Addendum)
Has blip Will see her back in 3 months with labs Given condoms Given flu shot.  dental

## 2015-09-28 NOTE — Assessment & Plan Note (Signed)
Check UDS at f/u

## 2015-09-28 NOTE — Assessment & Plan Note (Signed)
Will give her tripak zyrtec

## 2015-10-07 ENCOUNTER — Telehealth: Payer: Self-pay | Admitting: *Deleted

## 2015-10-07 NOTE — Telephone Encounter (Signed)
Patient called back, RN advised patient to seek evaluation at an Urgent Care since her symptoms have not resolved. Patient verbalized understanding and agreement. [name], RN

## 2015-10-07 NOTE — Telephone Encounter (Signed)
Patient called and advised she was in 09/28/15 to see the doctor and that she is worse since taking the [place]. She advised her cough is worse, she has a fever, lots of mucus, body aches and needs help walking. She does report shortness of breath no chest pain. Advised her we do not have a doctor in house today and will have to send a message to her doctor. Also advised her based on her symptoms she may need to go to the Urgent care to have a chest xray. She advised she will wait for the doctor to let her know what to do so call her back. Advised will do so asap.

## 2015-10-11 ENCOUNTER — Telehealth: Payer: Self-pay | Admitting: *Deleted

## 2015-10-11 NOTE — Telephone Encounter (Signed)
Dental Problem - wanting to talk with Dental Clinic.  Pt given telephone # for Corporate treasurer.

## 2015-12-29 ENCOUNTER — Other Ambulatory Visit (INDEPENDENT_AMBULATORY_CARE_PROVIDER_SITE_OTHER): Payer: Self-pay

## 2015-12-29 DIAGNOSIS — Z113 Encounter for screening for infections with a predominantly sexual mode of transmission: Secondary | ICD-10-CM

## 2015-12-29 DIAGNOSIS — Z79899 Other long term (current) drug therapy: Secondary | ICD-10-CM

## 2015-12-29 DIAGNOSIS — B20 Human immunodeficiency virus [HIV] disease: Secondary | ICD-10-CM

## 2015-12-29 LAB — COMPLETE METABOLIC PANEL WITH GFR
ALBUMIN: 4 g/dL (ref 3.6–5.1)
ALK PHOS: 144 U/L — AB (ref 33–115)
ALT: 14 U/L (ref 6–29)
AST: 18 U/L (ref 10–35)
BILIRUBIN TOTAL: 0.3 mg/dL (ref 0.2–1.2)
BUN: 11 mg/dL (ref 7–25)
CALCIUM: 9.5 mg/dL (ref 8.6–10.2)
CO2: 27 mmol/L (ref 20–31)
Chloride: 103 mmol/L (ref 98–110)
Creat: 0.89 mg/dL (ref 0.50–1.10)
GFR, EST AFRICAN AMERICAN: 89 mL/min (ref >=60)
GFR, EST NON AFRICAN AMERICAN: 77 mL/min (ref >=60)
GLUCOSE: 89 mg/dL (ref 65–99)
POTASSIUM: 4.5 mmol/L (ref 3.5–5.3)
SODIUM: 140 mmol/L (ref 135–146)
Total Protein: 7.3 g/dL (ref 6.1–8.1)

## 2015-12-29 LAB — LIPID PANEL
CHOL/HDL RATIO: 2.6 ratio (ref <=5.0)
Cholesterol: 209 mg/dL — ABNORMAL HIGH (ref 125–200)
HDL: 79 mg/dL (ref >=46)
LDL Cholesterol: 120 mg/dL (ref <130)
Triglycerides: 52 mg/dL (ref <150)
VLDL: 10 mg/dL (ref <30)

## 2015-12-29 LAB — CBC
HCT: 37.2 % (ref 36.0–46.0)
Hemoglobin: 12.3 g/dL (ref 12.0–15.0)
MCH: 30.3 pg (ref 26.0–34.0)
MCHC: 33.1 g/dL (ref 30.0–36.0)
MCV: 91.6 fL (ref 78.0–100.0)
MPV: 10.6 fL (ref 8.6–12.4)
Platelets: 337 10*3/uL (ref 150–400)
RBC: 4.06 MIL/uL (ref 3.87–5.11)
RDW: 15.8 % — ABNORMAL HIGH (ref 11.5–15.5)
WBC: 8 10*3/uL (ref 4.0–10.5)

## 2015-12-29 NOTE — Addendum Note (Signed)
Addended by: Reggy Eye on: 12/29/2015 03:08 PM   Modules accepted: Orders

## 2015-12-30 LAB — HIV-1 RNA QUANT-NO REFLEX-BLD
HIV 1 RNA QUANT: 83 {copies}/mL — AB (ref <20)
HIV-1 RNA QUANT, LOG: 1.92 {Log_copies}/mL — AB (ref <1.30)

## 2015-12-30 LAB — URINE CYTOLOGY ANCILLARY ONLY
Chlamydia: NEGATIVE
NEISSERIA GONORRHEA: NEGATIVE

## 2015-12-30 LAB — RPR TITER

## 2015-12-30 LAB — T-HELPER CELL (CD4) - (RCID CLINIC ONLY)
CD4 % Helper T Cell: 32 % — ABNORMAL LOW (ref 33–55)
CD4 T Cell Abs: 610 /uL (ref 400–2700)

## 2015-12-30 LAB — RPR: RPR Ser Ql: REACTIVE — AB

## 2015-12-30 LAB — FLUORESCENT TREPONEMAL AB(FTA)-IGG-BLD: Fluorescent Treponemal ABS: REACTIVE — AB

## 2016-01-12 ENCOUNTER — Ambulatory Visit: Payer: Self-pay | Admitting: Infectious Diseases

## 2016-01-13 ENCOUNTER — Encounter: Payer: Self-pay | Admitting: Infectious Diseases

## 2016-01-13 ENCOUNTER — Telehealth: Payer: Self-pay | Admitting: *Deleted

## 2016-01-13 ENCOUNTER — Ambulatory Visit (INDEPENDENT_AMBULATORY_CARE_PROVIDER_SITE_OTHER): Payer: Self-pay | Admitting: Infectious Diseases

## 2016-01-13 ENCOUNTER — Other Ambulatory Visit: Payer: Self-pay | Admitting: Infectious Diseases

## 2016-01-13 DIAGNOSIS — E785 Hyperlipidemia, unspecified: Secondary | ICD-10-CM

## 2016-01-13 DIAGNOSIS — Z23 Encounter for immunization: Secondary | ICD-10-CM

## 2016-01-13 DIAGNOSIS — B20 Human immunodeficiency virus [HIV] disease: Secondary | ICD-10-CM

## 2016-01-13 MED ORDER — EMTRICITAB-RILPIVIR-TENOFOV AF 200-25-25 MG PO TABS: 1.0000 | ORAL_TABLET | Freq: Every day | ORAL | Status: DC

## 2016-01-13 MED ORDER — ELVITEG-COBIC-EMTRICIT-TENOFAF 150-150-200-10 MG PO TABS: 1.0000 | ORAL_TABLET | Freq: Every day | ORAL | Status: DC

## 2016-01-13 NOTE — Assessment & Plan Note (Signed)
Mildly elevated at this visit Will continue to watch, hope to avoid/lessn polypharmacy

## 2016-01-13 NOTE — Telephone Encounter (Signed)
-----   Message from Campbell Riches, MD sent at 01/13/2016  1:17 PM EST ----- Regarding: call Can you please call Ms Reinholt and let her know I changed her medication to genvoya thanks

## 2016-01-13 NOTE — Progress Notes (Signed)
   Subjective:    Patient ID: Katie Doyle, female    DOB: 06-01-1966, 50 y.o.   MRN: [id number]  HPI 50 yo F with HTN (no PCP) and HIV+. She was dx October 2015 and was lost to f/u. She was re-tested when she was incarcerated. Pt also has hx of substance abuse (crack).  Last seen Oct 2016.  HIV 1 RNA QUANT (copies/mL)  Date Value  12/29/2015 83*  08/01/2015 69*  06/01/2015 7775*   CD4 T CELL ABS (/uL)  Date Value  12/29/2015 610  08/01/2015 730  06/01/2015 620   Today states she has been feeling well. She states she has been feeling well.  Needs to renew adap.  No problems with BP.  Has been trying to exercise more  ? Last PAP, mammo  Review of Systems  Constitutional: Negative for appetite change and unexpected weight change.  Respiratory: Negative for shortness of breath.   Cardiovascular: Negative for chest pain.  Gastrointestinal: Negative for diarrhea and constipation.  Genitourinary: Negative for difficulty urinating and menstrual problem.  Neurological: Negative for headaches.  ammenorrheic     Objective:   Physical Exam  Constitutional: She appears well-developed and well-nourished.  HENT:  Mouth/Throat: No oropharyngeal exudate.  Eyes: EOM are normal. Pupils are equal, round, and reactive to light.  Neck: Neck supple.  Cardiovascular: Normal rate, regular rhythm and normal heart sounds.   Pulmonary/Chest: Effort normal and breath sounds normal.  Abdominal: Soft. Bowel sounds are normal. There is no tenderness. There is no rebound.  Musculoskeletal: She exhibits no edema.  Lymphadenopathy:    She has no cervical adenopathy.  Neurological:  L facial droop      Assessment & Plan:

## 2016-01-13 NOTE — Assessment & Plan Note (Addendum)
Will give her condoms Change her complera to genotype after review of 01-2015 genotype Complete Hep B sereies today Set her up for pap, mammogram rtc in 4-5 months with labs prior

## 2016-01-13 NOTE — Addendum Note (Signed)
Addended by: Reggy Eye on: 01/13/2016 12:27 PM   Modules accepted: Orders

## 2016-01-13 NOTE — Telephone Encounter (Signed)
Done patient advised she will pick medication up today.

## 2016-01-13 NOTE — Assessment & Plan Note (Signed)
Encouraged her to diet and exercise.  

## 2016-01-20 ENCOUNTER — Ambulatory Visit (INDEPENDENT_AMBULATORY_CARE_PROVIDER_SITE_OTHER): Payer: Self-pay | Admitting: *Deleted

## 2016-01-20 DIAGNOSIS — Z124 Encounter for screening for malignant neoplasm of cervix: Secondary | ICD-10-CM

## 2016-01-20 NOTE — Progress Notes (Signed)
  Subjective:     Katie Doyle is a 50 y.o. woman who comes in today for a  pap smear only.  Previous abnormal Pap smears: no. Contraception: hysterectomy for urterine fibroids, 2014.  Condoms  Objective:    LMP 01/19/2013 Pelvic Exam:  Pap smear obtained.   Assessment:    Screening pap smear.   Plan:  Pt given educational materials re: HIV and women, self-esteem, BSE, nutrition and diet management, PAP smears and partner safety. Pt given condoms.  Follow up in one year, or as indicated by Pap results.

## 2016-01-20 NOTE — Patient Instructions (Signed)
Your results will be ready in about a week.  I will mail them to you.  Thank you for coming to the Center for your care.  Denise,  RN 

## 2016-01-23 LAB — CYTOLOGY - PAP

## 2016-01-25 ENCOUNTER — Encounter: Payer: Self-pay | Admitting: *Deleted

## 2016-01-25 ENCOUNTER — Telehealth: Payer: Self-pay | Admitting: *Deleted

## 2016-01-25 ENCOUNTER — Other Ambulatory Visit: Payer: Self-pay | Admitting: *Deleted

## 2016-01-25 MED ORDER — METRONIDAZOLE 500 MG PO TABS: 2000.0000 mg | ORAL_TABLET | Freq: Once | ORAL | Status: DC

## 2016-01-25 NOTE — Telephone Encounter (Signed)
Patient notified and Rx sent to [facility]. Katie Doyle

## 2016-01-25 NOTE — Telephone Encounter (Signed)
-----   Message from Campbell Riches, MD sent at 01/24/2016 12:05 PM EST ----- Pt needs flagyl 2g po once thanks

## 2016-02-14 ENCOUNTER — Other Ambulatory Visit (HOSPITAL_COMMUNITY): Payer: Self-pay | Admitting: *Deleted

## 2016-02-14 DIAGNOSIS — N644 Mastodynia: Secondary | ICD-10-CM

## 2016-02-23 ENCOUNTER — Other Ambulatory Visit: Payer: Self-pay

## 2016-02-23 ENCOUNTER — Ambulatory Visit (HOSPITAL_COMMUNITY): Payer: Self-pay

## 2016-02-24 ENCOUNTER — Telehealth: Payer: Self-pay | Admitting: *Deleted

## 2016-02-24 NOTE — Telephone Encounter (Signed)
Patient called stating her body is hurting everywhere and she is having chills. She is not having fever or cough, but states that she feels very badly and wanted to know what to do. Advised her to try Tylenol and drink water and clear liquids and if no improvement she should be seen at Urgent Care. She agreed with this plan.

## 2016-02-25 ENCOUNTER — Emergency Department (HOSPITAL_COMMUNITY)
Admission: EM | Admit: 2016-02-25 | Discharge: 2016-02-25 | Disposition: A | Discharge status: Home / Self Care | Payer: No Typology Code available for payment source | Attending: Physician Assistant | Admitting: Physician Assistant

## 2016-02-25 ENCOUNTER — Emergency Department (HOSPITAL_COMMUNITY): Payer: No Typology Code available for payment source

## 2016-02-25 ENCOUNTER — Encounter (HOSPITAL_COMMUNITY): Payer: Self-pay

## 2016-02-25 DIAGNOSIS — Z862 Personal history of diseases of the blood and blood-forming organs and certain disorders involving the immune mechanism: Secondary | ICD-10-CM | POA: Insufficient documentation

## 2016-02-25 DIAGNOSIS — Z79899 Other long term (current) drug therapy: Secondary | ICD-10-CM | POA: Insufficient documentation

## 2016-02-25 DIAGNOSIS — F419 Anxiety disorder, unspecified: Secondary | ICD-10-CM | POA: Insufficient documentation

## 2016-02-25 DIAGNOSIS — J45909 Unspecified asthma, uncomplicated: Secondary | ICD-10-CM | POA: Insufficient documentation

## 2016-02-25 DIAGNOSIS — F17210 Nicotine dependence, cigarettes, uncomplicated: Secondary | ICD-10-CM | POA: Insufficient documentation

## 2016-02-25 DIAGNOSIS — Z880 Allergy status to penicillin: Secondary | ICD-10-CM | POA: Insufficient documentation

## 2016-02-25 DIAGNOSIS — Z8673 Personal history of transient ischemic attack (TIA), and cerebral infarction without residual deficits: Secondary | ICD-10-CM | POA: Insufficient documentation

## 2016-02-25 DIAGNOSIS — G8929 Other chronic pain: Secondary | ICD-10-CM | POA: Insufficient documentation

## 2016-02-25 DIAGNOSIS — B20 Human immunodeficiency virus [HIV] disease: Secondary | ICD-10-CM | POA: Insufficient documentation

## 2016-02-25 DIAGNOSIS — Z86018 Personal history of other benign neoplasm: Secondary | ICD-10-CM | POA: Insufficient documentation

## 2016-02-25 DIAGNOSIS — I10 Essential (primary) hypertension: Secondary | ICD-10-CM | POA: Insufficient documentation

## 2016-02-25 DIAGNOSIS — F329 Major depressive disorder, single episode, unspecified: Secondary | ICD-10-CM | POA: Insufficient documentation

## 2016-02-25 DIAGNOSIS — B349 Viral infection, unspecified: Secondary | ICD-10-CM

## 2016-02-25 LAB — COMPREHENSIVE METABOLIC PANEL
ALBUMIN: 3.8 g/dL (ref 3.5–5.0)
ALK PHOS: 134 U/L — AB (ref 38–126)
ALT: 12 U/L — AB (ref 14–54)
AST: 19 U/L (ref 15–41)
Anion gap: 13 (ref 5–15)
BILIRUBIN TOTAL: 0.2 mg/dL — AB (ref 0.3–1.2)
BUN: 15 mg/dL (ref 6–20)
CALCIUM: 9.3 mg/dL (ref 8.9–10.3)
CO2: 22 mmol/L (ref 22–32)
CREATININE: 1.19 mg/dL — AB (ref 0.44–1.00)
Chloride: 100 mmol/L — ABNORMAL LOW (ref 101–111)
GFR calc Af Amer: >60 mL/min (ref >60)
GFR, EST NON AFRICAN AMERICAN: 53 mL/min — AB (ref >60)
GLUCOSE: 112 mg/dL — AB (ref 65–99)
Potassium: 3.6 mmol/L (ref 3.5–5.1)
Sodium: 135 mmol/L (ref 135–145)
TOTAL PROTEIN: 8.1 g/dL (ref 6.5–8.1)

## 2016-02-25 LAB — CBC WITH DIFFERENTIAL/PLATELET
BASOS ABS: 0 10*3/uL (ref 0.0–0.1)
BASOS PCT: 1 %
Eosinophils Absolute: 0 10*3/uL (ref 0.0–0.7)
Eosinophils Relative: 1 %
HEMATOCRIT: 41.1 % (ref 36.0–46.0)
Hemoglobin: 13.6 g/dL (ref 12.0–15.0)
Lymphocytes Relative: 29 %
Lymphs Abs: 1.9 10*3/uL (ref 0.7–4.0)
MCH: 30.1 pg (ref 26.0–34.0)
MCHC: 33.1 g/dL (ref 30.0–36.0)
MCV: 90.9 fL (ref 78.0–100.0)
MONO ABS: 0.7 10*3/uL (ref 0.1–1.0)
MONOS PCT: 10 %
Neutro Abs: 4 10*3/uL (ref 1.7–7.7)
Neutrophils Relative %: 59 %
PLATELETS: 271 10*3/uL (ref 150–400)
RBC: 4.52 MIL/uL (ref 3.87–5.11)
RDW: 14.7 % (ref 11.5–15.5)
WBC: 6.6 10*3/uL (ref 4.0–10.5)

## 2016-02-25 LAB — I-STAT CG4 LACTIC ACID, ED: LACTIC ACID, VENOUS: 0.69 mmol/L (ref 0.5–2.0)

## 2016-02-25 MED ORDER — IBUPROFEN 400 MG PO TABS
600.0000 mg | ORAL_TABLET | Freq: Once | ORAL | Status: AC
  Administered 2016-02-25: 600 mg via ORAL
  Filled 2016-02-25: qty 1

## 2016-02-25 MED ORDER — ONDANSETRON HCL 4 MG/2ML IJ SOLN
4.0000 mg | Freq: Once | INTRAMUSCULAR | Status: AC
  Administered 2016-02-25: 4 mg via INTRAVENOUS
  Filled 2016-02-25: qty 2

## 2016-02-25 MED ORDER — SODIUM CHLORIDE 0.9 % IV BOLUS (SEPSIS)
1000.0000 mL | Freq: Once | INTRAVENOUS | Status: AC
  Administered 2016-02-25: 1000 mL via INTRAVENOUS

## 2016-02-25 NOTE — ED Notes (Addendum)
Pt. Having flu-like symptoms cough, fever, body aches.  Mask placed in pt. In triage. Generalized Weakness

## 2016-02-25 NOTE — Discharge Instructions (Signed)
Please read and follow all provided instructions.  Your diagnoses today include:  1. Viral syndrome    You appear to have an upper respiratory infection (URI). An upper respiratory tract infection, or cold, is a viral infection of the air passages leading to the lungs. It should improve gradually after 5-7 days. You may have a lingering cough that lasts for 2- 4 weeks after the infection.  Tests performed today include:  Vital signs. See below for your results today.   Medications prescribed:   Take any prescribed medications only as directed. Treatment for your infection is aimed at treating the symptoms. There are no medications, such as antibiotics, that will cure your infection.   Home care instructions:  Follow any educational materials contained in this packet.   Your illness is contagious and can be spread to others, especially during the first 3 or 4 days. It cannot be cured by antibiotics or other medicines. Take basic precautions such as washing your hands often, covering your mouth when you cough or sneeze, and avoiding public places where you could spread your illness to others.   Please continue drinking plenty of fluids.  Use over-the-counter medicines as needed as directed on packaging for symptom relief.  You may also use ibuprofen or tylenol as directed on packaging for pain or fever.  Do not take multiple medicines containing Tylenol or acetaminophen to avoid taking too much of this medication.  Follow-up instructions: Please follow-up with your primary care provider in the next 3 days for further evaluation of your symptoms if you are not feeling better.   Return instructions:   Please return to the Emergency Department if you experience worsening symptoms.   RETURN IMMEDIATELY IF you develop shortness of breath, confusion or altered mental status, a new rash, become dizzy, faint, or poorly responsive, or are unable to be cared for at home.  Please return if you have  persistent vomiting and cannot keep down fluids or develop a fever that is not controlled by tylenol or motrin.    Please return if you have any other emergent concerns.  Additional Information:  Your vital signs today were: BP 128/78 mmHg   Pulse 91   Temp(Src) 99.5 F (37.5 C) (Oral)   Resp 22   Ht 5\' 5"  (1.651 m)   Wt 106.595 kg   BMI 39.11 kg/m2   SpO2 96%   LMP 01/19/2013 If your blood pressure (BP) was elevated above 135/85 this visit, please have this repeated by your doctor within one month. --------------

## 2016-02-25 NOTE — ED Provider Notes (Signed)
CSN: [id number]     Arrival date & time 02/25/16  [id number] History   First MD Initiated Contact with Patient 02/25/16 1128     Chief Complaint  Patient presents with  . Cough   (Consider location/radiation/quality/duration/timing/severity/associated sxs/prior Treatment) HPI 50 y.o. female presents to the Emergency Department today complaining of flu-like symptoms since Thursday evening. Associated productive cough, fever (101F), body aches, congestion, and headache. No rhinorrhea. No CP/SOB/ABD pain. No N/V/D. Reports sick contacts at work. Pt notes that she works in a [employer], trade [employer] that is around [employer]. No other symptoms noted.   Past Medical History  Diagnosis Date  . Fibroids   . Allergy   . SVD (spontaneous vaginal delivery) 1984    x 1  . Asthma     rarely uses inhaler  . Anemia   . Chronic pain in left shoulder 2013    post shoulder reduction  . Hypertension   . HIV (human immunodeficiency virus infection) ([place]) 09-2014  . Depression   . Schizophrenia ([place])   . Anxiety   . CVA (cerebral infarction)     ? pt unsure of date   Past Surgical History  Procedure Laterality Date  . Appendectomy    . Cesarean section  1992    x 1  . Wisdom tooth extraction    . Abdominal hysterectomy N/A 05/14/2013    Procedure: TOTAL ABDOMINAL HYSTERECTOMY;  Surgeon: Woodroe Mode, MD;  Location: [facility];  Service: Gynecology;  Laterality: N/A;  . Salpingoophorectomy Bilateral 05/14/2013    Procedure: SALPINGO OOPHORECTOMY;  Surgeon: Woodroe Mode, MD;  Location: [facility];  Service: Gynecology;  Laterality: Bilateral;  . Cysto N/A 05/14/2013    Procedure: CYSTO;  Surgeon: Woodroe Mode, MD;  Location: [facility];  Service: Gynecology;  Laterality: N/A;  . Incision and drainage abscess N/A 05/23/2013    Procedure: INCISION AND DRAINAGE OF ABDOMINAL ABSCESS;  Surgeon: Woodroe Mode, MD;  Location: [facility];  Service: Gynecology;  Laterality: N/A;   Family History  Problem Relation Age of Onset  . Diabetes  Mother   . Hypertension Mother   . Cancer Father     colon ~32 yo  . Diabetes Brother   . HIV Brother    Social History  Substance Use Topics  . Smoking status: Current Every Day Smoker -- 0.25 packs/day for 20 years    Types: Cigarettes  . Smokeless tobacco: Never Used  . Alcohol Use: 0.6 oz/week    1 Glasses of wine per week     Comment: 6pk at least 3 days wkly    OB History    Gravida Para Term Preterm AB TAB SAB Ectopic Multiple Living   3 2 2  0 1 1 0 0 0 2     Review of Systems ROS reviewed and all are negative for acute change except as noted in the HPI.  Allergies  Penicillins  Home Medications   Prior to Admission medications   Medication Sig Start Date End Date Taking? Authorizing Provider  albuterol (PROVENTIL HFA;VENTOLIN HFA) 108 (90 BASE) MCG/ACT inhaler Inhale 2 puffs into the lungs every 6 (six) hours as needed for wheezing.   Yes Historical Provider, MD  cyclobenzaprine (FLEXERIL) 5 MG tablet Take 1 tablet (5 mg total) by mouth 2 (two) times daily as needed for muscle spasms. 03/25/15  Yes Glendell Docker, NP  elvitegravir-cobicistat-emtricitabine-tenofovir (GENVOYA) 150-150-200-10 MG TABS tablet Take 1 tablet by mouth daily with breakfast. 01/13/16  Yes Campbell Riches, MD  hydrochlorothiazide (HYDRODIURIL) 25 MG tablet Take 25 mg by mouth daily.   Yes Historical Provider, MD  ibuprofen (ADVIL,MOTRIN) 200 MG tablet Take 400 mg by mouth every 4 (four) hours as needed for moderate pain.   Yes Historical Provider, MD  lisinopril (PRINIVIL,ZESTRIL) 20 MG tablet Take 20 mg by mouth daily.   Yes Historical Provider, MD  Multiple Vitamin (MULTIVITAMIN WITH MINERALS) TABS tablet Take 1 tablet by mouth daily.   Yes Historical Provider, MD  temazepam (RESTORIL) 7.5 MG capsule Take 7.5 mg by mouth at bedtime as needed for sleep.   Yes Historical Provider, MD  traZODone (DESYREL) 50 MG tablet Take 50 mg by mouth at bedtime as needed for sleep.    Yes Historical Provider,  MD  cetirizine (ZYRTEC) 10 MG chewable tablet Chew 1 tablet (10 mg total) by mouth daily. Patient not taking: Reported on 02/25/2016 09/28/15   Campbell Riches, MD  metroNIDAZOLE (FLAGYL) 500 MG tablet Take 4 tablets (2,000 mg total) by mouth once. Patient not taking: Reported on 02/25/2016 01/25/16   Campbell Riches, MD   BP 134/85 mmHg  Pulse 79  Temp(Src) 99.5 F (37.5 C) (Oral)  Resp 22  Ht 5\' 5"  (1.651 m)  Wt 106.595 kg  BMI 39.11 kg/m2  SpO2 97%  LMP 01/19/2013   Physical Exam  Constitutional: She is oriented to person, place, and time. She appears well-developed and well-nourished. No distress.  HENT:  Head: Normocephalic and atraumatic.  Right Ear: Tympanic membrane, external ear and ear canal normal.  Left Ear: Tympanic membrane, external ear and ear canal normal.  Nose: Nose normal.  Mouth/Throat: Uvula is midline, oropharynx is clear and moist and mucous membranes are normal. No trismus in the jaw. No oropharyngeal exudate, posterior oropharyngeal erythema or tonsillar abscesses.  Eyes: EOM are normal. Pupils are equal, round, and reactive to light.  Neck: Normal range of motion. Neck supple. No tracheal deviation present.  Cardiovascular: Normal rate, regular rhythm, S1 normal, S2 normal, normal heart sounds, intact distal pulses and normal pulses.   Pulmonary/Chest: Effort normal and breath sounds normal. No respiratory distress. She has no decreased breath sounds. She has no wheezes. She has no rhonchi. She has no rales.  Abdominal: Normal appearance and bowel sounds are normal. There is no tenderness.  Musculoskeletal: Normal range of motion.  Neurological: She is alert and oriented to person, place, and time.  Skin: Skin is warm and dry.  Psychiatric: She has a normal mood and affect. Her speech is normal and behavior is normal. Thought content normal.   ED Course  Procedures (including critical care time) Labs Review Labs Reviewed  COMPREHENSIVE METABOLIC PANEL  - Abnormal; Notable for the following:    Chloride 100 (*)    Glucose, Bld 112 (*)    Creatinine, Ser 1.19 (*)    ALT 12 (*)    Alkaline Phosphatase 134 (*)    Total Bilirubin 0.2 (*)    GFR calc non Af Amer 53 (*)    All other components within normal limits  CULTURE, BLOOD (ROUTINE X 2)  CULTURE, BLOOD (ROUTINE X 2)  CBC WITH DIFFERENTIAL/PLATELET  I-STAT CG4 LACTIC ACID, ED  I-STAT CG4 LACTIC ACID, ED   Imaging Review Dg Chest 2 View  02/25/2016  CLINICAL DATA:  50 year old female with a history of cough and congestion EXAM: CHEST - 2 VIEW COMPARISON:  03/25/2015 FINDINGS: Cardiomediastinal silhouette projects within normal limits in size and contour. No confluent airspace disease, pneumothorax, or pleural  effusion. No displaced fracture. Unremarkable appearance of the upper abdomen. IMPRESSION: No radiographic evidence of acute cardiopulmonary disease. Signed, Dulcy Fanny. Earleen Newport, DO [facility] Radiology Electronically Signed   By: Corrie Mckusick D.O.   On: 02/25/2016 12:04   I have personally reviewed and evaluated these images and lab results as part of my medical decision-making.   EKG Interpretation None      MDM  I have reviewed and evaluated the relevant laboratory values.I have reviewed and evaluated the relevant imaging studies.I personally evaluated and interpreted the relevant EKG.I have reviewed the relevant previous healthcare records.I have reviewed EMS Documentation.I obtained HPI from historian. Patient discussed with supervising physician  ED Course:  Assessment: Pt is a 49yF presents with URI symptoms since Thursday . On exam, pt in NAD. VSS. Afebrile. Lungs CTA, Heart RRR. Abdomen nontender/soft. Pt CXR negative for acute infiltrate. Given IV Fluids and Analgesia in ED. Patients symptoms are consistent with URI, likely viral etiology. Discussed that antibiotics are not indicated for viral infections. Pt will be  discharged with symptomatic treatment.  Verbalizes understanding and is agreeable with plan. Pt is hemodynamically stable & in NAD prior to dc.  Disposition/Plan:  DC Home Additional Verbal discharge instructions given and discussed with patient.  Pt Instructed to f/u with PCP in the next 48 hours for evaluation and treatment of symptoms. Return precautions given Pt acknowledges and agrees with plan  Supervising Physician Courteney Julio Alm, MD   Final diagnoses:  Viral syndrome       Shary Decamp, PA-C 02/25/16 [place], MD 02/26/16 [phone]

## 2016-02-27 ENCOUNTER — Encounter: Payer: Self-pay | Admitting: *Deleted

## 2016-02-27 ENCOUNTER — Telehealth: Payer: Self-pay | Admitting: *Deleted

## 2016-02-27 NOTE — Telephone Encounter (Signed)
Patient called stating she was seen at the ED on 02/25/16 for upper respiratory infection and believes she may have the flu. The ED wrote her out for the weekend only and to return to work today. Per Orland Mustard, RN (clinical supervisor), ok to give her a note for two more days and return on 02/29/16. Note put up front for daughter to pick up today and copy placed in scan.

## 2016-02-28 ENCOUNTER — Telehealth: Payer: Self-pay | Admitting: *Deleted

## 2016-02-28 NOTE — Telephone Encounter (Signed)
Patient was seen in the ED for viral illness 3/11, was written out until 3/15.  Patient is still feeling sick, is asking to be written out longer, will need a doctor to write the note. Patient scheduled 3/15 with Dr. Baxter Flattery. [name], RN

## 2016-02-29 ENCOUNTER — Ambulatory Visit: Payer: Self-pay | Admitting: Internal Medicine

## 2016-03-01 LAB — CULTURE, BLOOD (ROUTINE X 2)
CULTURE: NO GROWTH
Culture: NO GROWTH

## 2016-03-23 ENCOUNTER — Other Ambulatory Visit: Payer: Self-pay | Admitting: Internal Medicine

## 2016-03-23 ENCOUNTER — Telehealth: Payer: Self-pay | Admitting: *Deleted

## 2016-03-23 MED ORDER — CLINDAMYCIN HCL 300 MG PO CAPS: 300.0000 mg | ORAL_CAPSULE | Freq: Four times a day (QID) | ORAL | Status: DC

## 2016-03-23 NOTE — Telephone Encounter (Signed)
Sent!

## 2016-03-23 NOTE — Telephone Encounter (Signed)
Dental abscess, facial swelling, requesting ABX, ADAP pt.  Return to [facility] clinic in May.  Dr. Linus Salmons, please send rx if prescribed to [facility] at [facility].

## 2016-03-23 NOTE — Telephone Encounter (Signed)
Pt informed that rx was sent to pharmacy.  

## 2016-03-24 ENCOUNTER — Encounter (HOSPITAL_COMMUNITY): Payer: Self-pay | Admitting: *Deleted

## 2016-03-24 ENCOUNTER — Emergency Department (HOSPITAL_COMMUNITY)
Admission: EM | Admit: 2016-03-24 | Discharge: 2016-03-24 | Disposition: A | Discharge status: Home / Self Care | Payer: No Typology Code available for payment source | Attending: Emergency Medicine | Admitting: Emergency Medicine

## 2016-03-24 DIAGNOSIS — Z8673 Personal history of transient ischemic attack (TIA), and cerebral infarction without residual deficits: Secondary | ICD-10-CM | POA: Insufficient documentation

## 2016-03-24 DIAGNOSIS — G8929 Other chronic pain: Secondary | ICD-10-CM | POA: Insufficient documentation

## 2016-03-24 DIAGNOSIS — Z86018 Personal history of other benign neoplasm: Secondary | ICD-10-CM | POA: Insufficient documentation

## 2016-03-24 DIAGNOSIS — F17210 Nicotine dependence, cigarettes, uncomplicated: Secondary | ICD-10-CM | POA: Insufficient documentation

## 2016-03-24 DIAGNOSIS — F329 Major depressive disorder, single episode, unspecified: Secondary | ICD-10-CM | POA: Insufficient documentation

## 2016-03-24 DIAGNOSIS — Z862 Personal history of diseases of the blood and blood-forming organs and certain disorders involving the immune mechanism: Secondary | ICD-10-CM | POA: Insufficient documentation

## 2016-03-24 DIAGNOSIS — J45909 Unspecified asthma, uncomplicated: Secondary | ICD-10-CM | POA: Insufficient documentation

## 2016-03-24 DIAGNOSIS — B20 Human immunodeficiency virus [HIV] disease: Secondary | ICD-10-CM | POA: Insufficient documentation

## 2016-03-24 DIAGNOSIS — Z792 Long term (current) use of antibiotics: Secondary | ICD-10-CM | POA: Insufficient documentation

## 2016-03-24 DIAGNOSIS — Z880 Allergy status to penicillin: Secondary | ICD-10-CM | POA: Insufficient documentation

## 2016-03-24 DIAGNOSIS — K0889 Other specified disorders of teeth and supporting structures: Secondary | ICD-10-CM

## 2016-03-24 DIAGNOSIS — I10 Essential (primary) hypertension: Secondary | ICD-10-CM | POA: Insufficient documentation

## 2016-03-24 DIAGNOSIS — F419 Anxiety disorder, unspecified: Secondary | ICD-10-CM | POA: Insufficient documentation

## 2016-03-24 DIAGNOSIS — Z79899 Other long term (current) drug therapy: Secondary | ICD-10-CM | POA: Insufficient documentation

## 2016-03-24 MED ORDER — LIDOCAINE VISCOUS 2 % MT SOLN
15.0000 mL | Freq: Once | OROMUCOSAL | Status: AC
  Administered 2016-03-24: 15 mL via OROMUCOSAL
  Filled 2016-03-24: qty 15

## 2016-03-24 MED ORDER — NAPROXEN 500 MG PO TABS: 500.0000 mg | ORAL_TABLET | Freq: Two times a day (BID) | ORAL | Status: DC

## 2016-03-24 MED ORDER — KETOROLAC TROMETHAMINE 60 MG/2ML IM SOLN
60.0000 mg | Freq: Once | INTRAMUSCULAR | Status: AC
  Administered 2016-03-24: 60 mg via INTRAMUSCULAR
  Filled 2016-03-24: qty 2

## 2016-03-24 MED ORDER — LIDOCAINE VISCOUS 2 % MT SOLN: 20.0000 mL | OROMUCOSAL | Status: DC | PRN

## 2016-03-24 MED ORDER — KETOROLAC TROMETHAMINE 30 MG/ML IJ SOLN: 30.0000 mg | Freq: Once | INTRAMUSCULAR | Status: DC

## 2016-03-24 NOTE — ED Provider Notes (Signed)
CSN: [id number]     Arrival date & time 03/24/16  2124 History   First MD Initiated Contact with Patient 03/24/16 2143     Chief Complaint  Patient presents with  . Dental Problem     (Consider location/radiation/quality/duration/timing/severity/associated sxs/prior Treatment) HPI   Katie Doyle is a 50 y.o. female, with a history of schizophrenia, anemia, hypertension, and HIV, presenting to the ED with right upper tooth pain that began two days ago. Pt rates her pain at 10/10, aching, nonradiating. Pt called her PCP yesterday and was prescribed Clindamycin. Pt states she is worried because the antibiotic did not cure her since yesterday. Pt has not taken anything for her pain. Pt denies difficulty breathing or swallowing, fever/chills, nausea/vomiting, or any other pain or complaints.    Past Medical History  Diagnosis Date  . Fibroids   . Allergy   . SVD (spontaneous vaginal delivery) 1984    x 1  . Asthma     rarely uses inhaler  . Anemia   . Chronic pain in left shoulder 2013    post shoulder reduction  . Hypertension   . HIV (human immunodeficiency virus infection) ([place]) 09-2014  . Depression   . Schizophrenia ([place])   . Anxiety   . CVA (cerebral infarction)     ? pt unsure of date   Past Surgical History  Procedure Laterality Date  . Appendectomy    . Cesarean section  1992    x 1  . Wisdom tooth extraction    . Abdominal hysterectomy N/A 05/14/2013    Procedure: TOTAL ABDOMINAL HYSTERECTOMY;  Surgeon: Woodroe Mode, MD;  Location: [facility];  Service: Gynecology;  Laterality: N/A;  . Salpingoophorectomy Bilateral 05/14/2013    Procedure: SALPINGO OOPHORECTOMY;  Surgeon: Woodroe Mode, MD;  Location: [facility];  Service: Gynecology;  Laterality: Bilateral;  . Cysto N/A 05/14/2013    Procedure: CYSTO;  Surgeon: Woodroe Mode, MD;  Location: [facility];  Service: Gynecology;  Laterality: N/A;  . Incision and drainage abscess N/A 05/23/2013    Procedure: INCISION AND DRAINAGE  OF ABDOMINAL ABSCESS;  Surgeon: Woodroe Mode, MD;  Location: [facility];  Service: Gynecology;  Laterality: N/A;   Family History  Problem Relation Age of Onset  . Diabetes Mother   . Hypertension Mother   . Cancer Father     colon ~5 yo  . Diabetes Brother   . HIV Brother    Social History  Substance Use Topics  . Smoking status: Current Every Day Smoker -- 0.25 packs/day for 20 years    Types: Cigarettes  . Smokeless tobacco: Never Used  . Alcohol Use: 0.6 oz/week    1 Glasses of wine per week     Comment: 6pk at least 3 days wkly    OB History    Gravida Para Term Preterm AB TAB SAB Ectopic Multiple Living   3 2 2  0 1 1 0 0 0 2     Review of Systems  HENT: Positive for dental problem and facial swelling. Negative for trouble swallowing.   Respiratory: Negative for shortness of breath.   Neurological: Negative for headaches.      Allergies  Penicillins  Home Medications   Prior to Admission medications   Medication Sig Start Date End Date Taking? Authorizing Provider  albuterol (PROVENTIL HFA;VENTOLIN HFA) 108 (90 BASE) MCG/ACT inhaler Inhale 2 puffs into the lungs every 6 (six) hours as needed for wheezing.    Historical Provider, MD  cetirizine (ZYRTEC) 10 MG chewable tablet Chew 1 tablet (10 mg total) by mouth daily. Patient not taking: Reported on 02/25/2016 09/28/15   Campbell Riches, MD  clindamycin (CLEOCIN) 300 MG capsule Take 1 capsule (300 mg total) by mouth 4 (four) times daily. 03/23/16   Thayer Headings, MD  cyclobenzaprine (FLEXERIL) 5 MG tablet Take 1 tablet (5 mg total) by mouth 2 (two) times daily as needed for muscle spasms. 03/25/15   Glendell Docker, NP  elvitegravir-cobicistat-emtricitabine-tenofovir (GENVOYA) 150-150-200-10 MG TABS tablet Take 1 tablet by mouth daily with breakfast. 01/13/16   Campbell Riches, MD  hydrochlorothiazide (HYDRODIURIL) 25 MG tablet Take 25 mg by mouth daily.    Historical Provider, MD  ibuprofen (ADVIL,MOTRIN) 200 MG  tablet Take 400 mg by mouth every 4 (four) hours as needed for moderate pain.    Historical Provider, MD  lidocaine (XYLOCAINE) 2 % solution Use as directed 20 mLs in the mouth or throat as needed for mouth pain. 03/24/16   Shawn C Joy, PA-C  lisinopril (PRINIVIL,ZESTRIL) 20 MG tablet Take 20 mg by mouth daily.    Historical Provider, MD  metroNIDAZOLE (FLAGYL) 500 MG tablet Take 4 tablets (2,000 mg total) by mouth once. Patient not taking: Reported on 02/25/2016 01/25/16   Campbell Riches, MD  Multiple Vitamin (MULTIVITAMIN WITH MINERALS) TABS tablet Take 1 tablet by mouth daily.    Historical Provider, MD  naproxen (NAPROSYN) 500 MG tablet Take 1 tablet (500 mg total) by mouth 2 (two) times daily. 03/24/16   Shawn C Joy, PA-C  temazepam (RESTORIL) 7.5 MG capsule Take 7.5 mg by mouth at bedtime as needed for sleep.    Historical Provider, MD  traZODone (DESYREL) 50 MG tablet Take 50 mg by mouth at bedtime as needed for sleep.     Historical Provider, MD   BP 141/82 mmHg  Pulse 86  Temp(Src) 99.2 F (37.3 C) (Oral)  Resp 16  Ht 5\' 5"  (1.651 m)  Wt 104.923 kg  BMI 38.49 kg/m2  SpO2 100%  LMP 01/19/2013 Physical Exam  Constitutional: She appears well-developed and well-nourished. No distress.  HENT:  Head: Normocephalic and atraumatic.  Swelling noted to the gingiva above the right top incisor. Swelling extends into the upper lip. Pt can open her mouth to at least three finger widths and can readily handle secretions without difficulty. No area of fluctuance to suggest an abscess.  Eyes: Conjunctivae are normal.  Cardiovascular: Normal rate and regular rhythm.   Pulmonary/Chest: Effort normal.  Neurological: She is alert.  Skin: Skin is warm and dry. She is not diaphoretic.  Nursing note and vitals reviewed.   ED Course  Procedures (including critical care time)   MDM   Final diagnoses:  Tooth pain    Katie Doyle presents with right upper tooth pain for 2 days.  This patient's  presentation is consistent with a possible dental infection. Patient has no red flag symptoms. No signs of Ludwig's angioedema. No signs of sepsis or other systemic illness. Although the patient states that her pain is 10 out of 10 and the area is "almost too tender to touch," pt was observed laying face down on the reclined exam chair with the right side of her face down. Pt was also noted to lay down on that side with some force, rather than gingerly. Patient is already on an appropriate antibiotic and has a plan for visiting a dentist on Monday, April 10. Patient was offered a dental block with  an explanation of the effects, patient declined. Home care and return precautions discussed. Patient voiced understanding of these instructions and is comfortable with discharge. Patient appears safe for discharge at this time.   Filed Vitals:   03/24/16 2134 03/24/16 2314  BP: 153/111 141/82  Pulse: 93 86  Temp: 99.2 F (37.3 C)   TempSrc: Oral   Resp: 20 16  Height: 5\' 5"  (1.651 m)   Weight: 104.923 kg   SpO2: 97% 100%     Lorayne Bender, PA-C 03/25/16 [place], MD 03/25/16 [phone]

## 2016-03-24 NOTE — ED Notes (Signed)
Toothache for 2 days with face swelling

## 2016-03-24 NOTE — Discharge Instructions (Signed)
You have been seen today for tooth pain. Be sure to follow up with a dentist as soon as possible. Continue to take the antibiotics prescribed to you by your doctor. Ibuprofen, naproxen, or Tylenol for pain. Follow up with PCP as needed. Return to ED should symptoms worsen.  RESOURCE GUIDE  Chronic Pain Problems: Contact [facility]  [phone] Patients need to be referred by their primary care doctor.  Insufficient Money for Medicine: Contact United Way:  call "211" or [place] [phone].  No Primary Care Doctor: - Call Health Connect  [phone] - can help you locate a primary care doctor that  accepts your insurance, provides certain services, etc. - Physician Referral Service- [phone]  Agencies that provide inexpensive medical care: - [facility]  [facility]  [phone] - [facility]  [phone] - [facility]  [phone] - Planned Parenthood  [phone] - [facility]  [phone]  [place] Providers: - [facility]- [address], [address]  [phone], Mon-Fri 9am-7pm, Sat 9am-1pm - [place], [address] [place]  [place], [address] [place]  [place]- [address]  [place], [address], [phone]  Only accepts [place] patients after they have their name  applied to their card  Self Pay (no insurance) in [place]: - Sickle Cell Patients: Dr Kevan Ny, [facility]  [place], [facility]- [place]  [facility] [place]- [id number] [place] [address], [facility]- see information above (Speak to [employer] if you do not have insurance)       -  [facility]- [place], [place],  [place], [place]  Dr Vista Lawman-  [address], [place], [place] [facility]- [address] [phone]       -  [facility]- [address] [place], [place], also [address], [phone]       -    [facility]- [address], [place], 1st & 3rd Saturday   every month, 10am-1pm  1) Find a Doctor and Pay Out of Pocket Although you won't have to find out who is covered by your insurance plan, it is a good idea to ask around and get recommendations. You will then need to call the office and see if the doctor you have chosen will accept you as a new patient and what types of options they offer for patients who are self-pay. Some doctors offer discounts or will set up payment plans for their patients who do not have insurance, but you will need to ask so you aren't surprised when you get to your appointment.  2) Contact Your Local Health Department Not all health departments have doctors that can see patients for sick visits, but  many do, so it is worth a call to see if yours does. If you don't know where your local health department is, you can check in your phone book. The CDC also has a tool to help you locate your state's health department, and many state websites also have listings of all of their local health departments.  3) Find a [facility] If your illness is not likely to be very severe or complicated, you may want to try a walk in clinic. These are popping up all over the country in pharmacies, drugstores, and shopping centers. They're usually staffed by nurse practitioners or physician assistants that have been trained to treat common illnesses and complaints. They're usually fairly quick and inexpensive. However, if you have serious medical issues or chronic  medical problems, these are probably not your best option  STD Testing - [place], [facility], [address], [place], phone [phone] or [phone].  Monday - Friday, call for an appointment. - [place], [facility] Clinic, [address], [place], phone [phone] or [phone].  Monday - Friday, call for an appointment.  Abuse/Neglect: - [place] [phone] - [place] [phone] (After Hours)  Emergency Shelter:  [facility] [phone]  Maternity Homes: - Room at the [place] [phone] - [place] [phone]  MRSA Hotline #:   (331)818-0887  [facility] Dept. [address]         [place] Phone:  [phone]                                  Phone:  [phone]                   Phone:  [phone]  [place], [place] in [place], [address]                                  [place] [phone] or [phone] (After Hours)   [place]  Substance Abuse Resources: - Alcohol and Drug Services  [phone] - [place] [phone] - The [place] [phone] - Residential & Outpatient Substance Abuse Program  [phone]  Psychological Services: - [place]  [place]  [place], [phone] [place]. [address],  [place], [place]: [phone] or [phone],  PicCapture.uy  Dental Assistance  If unable to pay or uninsured, contact:  [place] or [facility]. to become qualified for the adult dental clinic.  Patients with Medicaid: [facility] [phone] [address], [place] [address] [phone]  If unable to pay, or uninsured, contact [facility] [phone]) or [place] [phone] in [place], [place] in [facility]) to become qualified for the adult dental clinic   Other [place]- [place], [place], [place], [address], [place], [place], 2nd and 4th Thursday of the month at 6:30am.  10 clients each day by appointment, can sometimes see walk-in patients if someone does not show for an appointment. [facility]- [address] Hillard Danker [place], [place], [address], [place], [place], [place], [address], [place] Department- [place] Department- [place] Department- [phone]

## 2016-05-10 ENCOUNTER — Ambulatory Visit: Payer: Self-pay | Admitting: *Deleted

## 2016-05-15 ENCOUNTER — Ambulatory Visit: Payer: Self-pay | Admitting: Infectious Diseases

## 2016-05-15 ENCOUNTER — Telehealth: Payer: Self-pay | Admitting: *Deleted

## 2016-05-15 NOTE — Telephone Encounter (Signed)
Requested pt call [facility] for an appt.

## 2016-06-17 ENCOUNTER — Emergency Department (HOSPITAL_COMMUNITY)
Admission: EM | Admit: 2016-06-17 | Discharge: 2016-06-17 | Disposition: A | Discharge status: Home / Self Care | Payer: No Typology Code available for payment source | Attending: Emergency Medicine | Admitting: Emergency Medicine

## 2016-06-17 ENCOUNTER — Encounter (HOSPITAL_COMMUNITY): Payer: Self-pay

## 2016-06-17 DIAGNOSIS — K047 Periapical abscess without sinus: Secondary | ICD-10-CM

## 2016-06-17 DIAGNOSIS — Z8673 Personal history of transient ischemic attack (TIA), and cerebral infarction without residual deficits: Secondary | ICD-10-CM | POA: Insufficient documentation

## 2016-06-17 DIAGNOSIS — I10 Essential (primary) hypertension: Secondary | ICD-10-CM | POA: Insufficient documentation

## 2016-06-17 DIAGNOSIS — J45909 Unspecified asthma, uncomplicated: Secondary | ICD-10-CM | POA: Insufficient documentation

## 2016-06-17 DIAGNOSIS — F17210 Nicotine dependence, cigarettes, uncomplicated: Secondary | ICD-10-CM | POA: Insufficient documentation

## 2016-06-17 LAB — CBC WITH DIFFERENTIAL/PLATELET
BASOS ABS: 0 10*3/uL (ref 0.0–0.1)
BASOS PCT: 0 %
EOS ABS: 0.1 10*3/uL (ref 0.0–0.7)
Eosinophils Relative: 1 %
HEMATOCRIT: 39 % (ref 36.0–46.0)
Hemoglobin: 12.4 g/dL (ref 12.0–15.0)
Lymphocytes Relative: 16 %
Lymphs Abs: 1.7 10*3/uL (ref 0.7–4.0)
MCH: 30.1 pg (ref 26.0–34.0)
MCHC: 31.8 g/dL (ref 30.0–36.0)
MCV: 94.7 fL (ref 78.0–100.0)
MONO ABS: 0.7 10*3/uL (ref 0.1–1.0)
Monocytes Relative: 6 %
NEUTROS ABS: 8.6 10*3/uL — AB (ref 1.7–7.7)
Neutrophils Relative %: 77 %
PLATELETS: 309 10*3/uL (ref 150–400)
RBC: 4.12 MIL/uL (ref 3.87–5.11)
RDW: 15.5 % (ref 11.5–15.5)
WBC: 11.2 10*3/uL — ABNORMAL HIGH (ref 4.0–10.5)

## 2016-06-17 LAB — BASIC METABOLIC PANEL
ANION GAP: 5 (ref 5–15)
BUN: 11 mg/dL (ref 6–20)
CALCIUM: 9.2 mg/dL (ref 8.9–10.3)
CO2: 25 mmol/L (ref 22–32)
CREATININE: 1.04 mg/dL — AB (ref 0.44–1.00)
Chloride: 108 mmol/L (ref 101–111)
GLUCOSE: 88 mg/dL (ref 65–99)
Potassium: 5.6 mmol/L — ABNORMAL HIGH (ref 3.5–5.1)
Sodium: 138 mmol/L (ref 135–145)

## 2016-06-17 MED ORDER — BUPIVACAINE HCL (PF) 0.5 % IJ SOLN
10.0000 mL | Freq: Once | INTRAMUSCULAR | Status: AC
  Administered 2016-06-17: 10 mL
  Filled 2016-06-17: qty 10

## 2016-06-17 MED ORDER — HYDROCODONE-ACETAMINOPHEN 5-325 MG PO TABS
1.0000 | ORAL_TABLET | Freq: Once | ORAL | Status: AC
  Administered 2016-06-17: 1 via ORAL
  Filled 2016-06-17: qty 1

## 2016-06-17 MED ORDER — MORPHINE SULFATE (PF) 4 MG/ML IV SOLN
4.0000 mg | Freq: Once | INTRAVENOUS | Status: AC
  Administered 2016-06-17: 4 mg via INTRAVENOUS
  Filled 2016-06-17: qty 1

## 2016-06-17 MED ORDER — CLINDAMYCIN HCL 300 MG PO CAPS: 300.0000 mg | ORAL_CAPSULE | Freq: Four times a day (QID) | ORAL | Status: DC

## 2016-06-17 MED ORDER — HYDROCODONE-ACETAMINOPHEN 5-325 MG PO TABS: 1.0000 | ORAL_TABLET | ORAL | Status: DC | PRN

## 2016-06-17 NOTE — ED Provider Notes (Signed)
CSN: [id number]     Arrival date & time 06/17/16  1031 History   First MD Initiated Contact with Patient 06/17/16 1114     Chief Complaint  Patient presents with  . Facial Swelling     (Consider location/radiation/quality/duration/timing/severity/associated sxs/prior Treatment) HPI Katie Doyle is a 50 y.o. female with history of HIV, as of January her viral load was 83 copies, CD4 610, here for evaluation of gum swelling. Patient reports last night she noticed a knot in her upper lip and gum and when she woke up this morning her entire upper lip and gums were swollen, tender. Denies any difficulty swallowing, breathing. No new foods, medications or other exposures.  Past Medical History  Diagnosis Date  . Fibroids   . Allergy   . SVD (spontaneous vaginal delivery) 1984    x 1  . Asthma     rarely uses inhaler  . Anemia   . Chronic pain in left shoulder 2013    post shoulder reduction  . Hypertension   . HIV (human immunodeficiency virus infection) ([place]) 09-2014  . Depression   . Schizophrenia ([place])   . Anxiety   . CVA (cerebral infarction)     ? pt unsure of date   Past Surgical History  Procedure Laterality Date  . Appendectomy    . Cesarean section  1992    x 1  . Wisdom tooth extraction    . Abdominal hysterectomy N/A 05/14/2013    Procedure: TOTAL ABDOMINAL HYSTERECTOMY;  Surgeon: Woodroe Mode, MD;  Location: [facility];  Service: Gynecology;  Laterality: N/A;  . Salpingoophorectomy Bilateral 05/14/2013    Procedure: SALPINGO OOPHORECTOMY;  Surgeon: Woodroe Mode, MD;  Location: [facility];  Service: Gynecology;  Laterality: Bilateral;  . Cysto N/A 05/14/2013    Procedure: CYSTO;  Surgeon: Woodroe Mode, MD;  Location: [facility];  Service: Gynecology;  Laterality: N/A;  . Incision and drainage abscess N/A 05/23/2013    Procedure: INCISION AND DRAINAGE OF ABDOMINAL ABSCESS;  Surgeon: Woodroe Mode, MD;  Location: [facility];  Service: Gynecology;  Laterality: N/A;   Family History   Problem Relation Age of Onset  . Diabetes Mother   . Hypertension Mother   . Cancer Father     colon ~80 yo  . Diabetes Brother   . HIV Brother    Social History  Substance Use Topics  . Smoking status: Current Every Day Smoker -- 0.25 packs/day for 20 years    Types: Cigarettes  . Smokeless tobacco: Never Used  . Alcohol Use: 0.6 oz/week    1 Glasses of wine per week     Comment: 6pk at least 3 days wkly    OB History    Gravida Para Term Preterm AB TAB SAB Ectopic Multiple Living   3 2 2  0 1 1 0 0 0 2     Review of Systems A 10 point review of systems was completed and was negative except for pertinent positives and negatives as mentioned in the history of present illness     Allergies  Penicillins  Home Medications   Prior to Admission medications   Medication Sig Start Date End Date Taking? Authorizing Provider  albuterol (PROVENTIL HFA;VENTOLIN HFA) 108 (90 BASE) MCG/ACT inhaler Inhale 2 puffs into the lungs every 6 (six) hours as needed for wheezing.   Yes Historical Provider, MD  elvitegravir-cobicistat-emtricitabine-tenofovir (GENVOYA) 150-150-200-10 MG TABS tablet Take 1 tablet by mouth daily with breakfast. 01/13/16  Yes Doroteo Bradford  Johnnye Sima, MD  hydrochlorothiazide (HYDRODIURIL) 25 MG tablet Take 25 mg by mouth daily.   Yes Historical Provider, MD  ibuprofen (ADVIL,MOTRIN) 200 MG tablet Take 400 mg by mouth every 4 (four) hours as needed for moderate pain.   Yes Historical Provider, MD  lisinopril (PRINIVIL,ZESTRIL) 20 MG tablet Take 20 mg by mouth daily.   Yes Historical Provider, MD  Multiple Vitamin (MULTIVITAMIN WITH MINERALS) TABS tablet Take 1 tablet by mouth daily.   Yes Historical Provider, MD  temazepam (RESTORIL) 7.5 MG capsule Take 7.5 mg by mouth at bedtime as needed for sleep.   Yes Historical Provider, MD  traZODone (DESYREL) 50 MG tablet Take 50 mg by mouth at bedtime as needed for sleep.    Yes Historical Provider, MD  cetirizine (ZYRTEC) 10 MG  chewable tablet Chew 1 tablet (10 mg total) by mouth daily. Patient not taking: Reported on 02/25/2016 09/28/15   Campbell Riches, MD  clindamycin (CLEOCIN) 300 MG capsule Take 1 capsule (300 mg total) by mouth 4 (four) times daily. Patient not taking: Reported on 06/17/2016 03/23/16   Thayer Headings, MD  cyclobenzaprine (FLEXERIL) 5 MG tablet Take 1 tablet (5 mg total) by mouth 2 (two) times daily as needed for muscle spasms. Patient not taking: Reported on 06/17/2016 03/25/15   Glendell Docker, NP  lidocaine (XYLOCAINE) 2 % solution Use as directed 20 mLs in the mouth or throat as needed for mouth pain. Patient not taking: Reported on 06/17/2016 03/24/16   Shawn C Joy, PA-C  metroNIDAZOLE (FLAGYL) 500 MG tablet Take 4 tablets (2,000 mg total) by mouth once. Patient not taking: Reported on 02/25/2016 01/25/16   Campbell Riches, MD  naproxen (NAPROSYN) 500 MG tablet Take 1 tablet (500 mg total) by mouth 2 (two) times daily. Patient not taking: Reported on 06/17/2016 03/24/16   Shawn C Joy, PA-C   BP 163/100 mmHg  Pulse 72  Temp(Src) 97.3 F (36.3 C)  Resp 18  Ht 5\' 5"  (1.651 m)  Wt 104.327 kg  BMI 38.27 kg/m2  SpO2 100%  LMP 01/19/2013 Physical Exam  Constitutional: She is oriented to person, place, and time. She appears well-developed and well-nourished.  HENT:  Head: Normocephalic and atraumatic.  Mouth/Throat: Oropharynx is clear and moist.  Diffuse swelling, induration and tenderness to upper lip as well as to upper gingiva. Area of fluctuance to gingiva over maxillary incisors. Upon palpation, purulent drainage expressed. Overall poor dentition. Oropharynx otherwise clear and moist, widely patent airway. Tolerating secretions well with no trismus or unilateral tonsillar swelling.  Eyes: Conjunctivae are normal. Pupils are equal, round, and reactive to light. Right eye exhibits no discharge. Left eye exhibits no discharge. No scleral icterus.  Neck: Neck supple.  Cardiovascular: Normal rate,  regular rhythm and normal heart sounds.   Pulmonary/Chest: Effort normal and breath sounds normal. No respiratory distress. She has no wheezes. She has no rales.  Abdominal: Soft. There is no tenderness.  Musculoskeletal: She exhibits no tenderness.  Neurological: She is alert and oriented to person, place, and time.  Cranial Nerves II-XII grossly intact  Skin: Skin is warm and dry. No rash noted.  Psychiatric: She has a normal mood and affect.  Nursing note and vitals reviewed.   ED Course  Procedures (including critical care time) Labs Review Labs Reviewed  BASIC METABOLIC PANEL  CBC WITH DIFFERENTIAL/PLATELET    Imaging Review No results found. I have personally reviewed and evaluated these images and lab results as part of my medical  decision-making.   EKG Interpretation None     NERVE BLOCK Performed by: Verl Dicker Consent: Verbal consent obtained. Required items: required blood products, implants, devices, and special equipment available Time out: Immediately prior to procedure a "time out" was called to verify the correct patient, procedure, equipment, support staff and site/side marked as required.  Indication: dental abscess Nerve block body site: sup periosteal  Preparation: Patient was prepped and draped in the usual sterile fashion. Needle gauge: 24 G Location technique: anatomical landmarks  Local anesthetic: bupivicane  Anesthetic total: 5 ml  Outcome: pain improved Patient tolerance: Patient tolerated the procedure well with no immediate complications.   INCISION AND DRAINAGE Performed by: Verl Dicker Consent: Verbal consent obtained. Risks and benefits: risks, benefits and alternatives were discussed Type: abscess  Body area: incisor  Anesthesia: local infiltration  Incision was made with a scalpel.  Local anesthetic: bupivicaine  Anesthetic total: 5 ml  Complexity: complex Blunt dissection to break up  loculations  Drainage: purulent  Drainage amount: small  Packing material: none  Patient tolerance: Patient tolerated the procedure well with no immediate complications.    MDM  Here for evaluation of dental pain. On exam, there Is evidence of drainable abscess along maxillary incisors which was successfully opened at bedside. No trismus, glossal elevation, unilateral tonsillar swelling. No evidence of retropharyngeal or peritonsillar abscess or Ludwig/Vincent angina. Patient received a dental block in the ED and experiences relief. Discharged with outpatient dental resources as well as referral to on-call dentistry. Also given prescription for pain medicines and antibiotics. Overall, appears well, nontoxic and appropriate for discharge.   Final diagnoses:  Dental abscess        Comer Locket, PA-C 06/17/16 [place], MD 06/17/16 [phone]

## 2016-06-17 NOTE — ED Notes (Signed)
Ben PA at bedside for procedure

## 2016-06-17 NOTE — ED Notes (Signed)
Patient here with upper gum swelling with facial/mouth swelling x 1 day, is able to swallow and handle secretions

## 2016-06-17 NOTE — ED Notes (Signed)
Pt verbalized understanding of d/c instructions, prescriptions, and follow-up care. No further questions/concerns, VSS, ambulatory w/ steady gait (refused wheelchair) 

## 2016-06-17 NOTE — Discharge Instructions (Signed)
Your dental abscess was drained in the emergency department. It is important for you to take your antibiotics as prescribed. It is also important for you to Follow-up with your dentist or Dr. Junita Push for definitive care. Return to ED for any new or worsening symptoms.  Dental Abscess A dental abscess is pus in or around a tooth. HOME CARE  Take medicines only as told by your dentist.  If you were prescribed antibiotic medicine, finish all of it even if you start to feel better.  Rinse your mouth (gargle) often with salt water.  Do not drive or use heavy machinery, like a lawn mower, while taking pain medicine.  Do not apply heat to the outside of your mouth.  Keep all follow-up visits as told by your dentist. This is important. GET HELP IF:  Your pain is worse, and medicine does not help. GET HELP RIGHT AWAY IF:  You have a fever or chills.  Your symptoms suddenly get worse.  You have a very bad headache.  You have problems breathing or swallowing.  You have trouble opening your mouth.  You have puffiness (swelling) in your neck or around your eye.   This information is not intended to replace advice given to you by your health care provider. Make sure you discuss any questions you have with your health care provider.   Document Released: 04/19/2015 Document Reviewed: 04/19/2015 Elsevier Interactive Patient Education 2016 [place] [facility]  Community Service Learning [facility]  [address]  [place], [place] [address]  Phone [phone]  The [facility] in [place], [place], exemplifies the Health Net vision to improve the health and quality of life of all [place] by Regulatory affairs officer with a passion to care for the underserved and by leading the nation in community-based, service learning oral health  education. We are committed to offering comprehensive general dental services for adults, children and special needs patients in a safe, caring and professional setting.  Appointments: Our clinic is open Monday through Friday 8:00 a.m. until 5:00 p.m. The amount of time scheduled for an appointment depends on the patients specific needs. We ask that you keep your appointed time for care or provide 24-hour notice of all appointment changes. Parents or legal guardians must accompany minor children.  Payment for Services: Medicaid and other insurance plans are welcome. Payment for services is due when services are rendered and may be made by cash or credit card. If you have dental insurance, we will assist you with your claim submission.   Emergencies: Emergency services will be provided Monday through Friday on a walk-in basis. Please arrive early for emergency services. After hours emergency services will be provided for patients of record as required.  Services:  Marine scientist Dentistry  Oral Surgery - Extractions  Root Canals  Sealants and Tooth Colored Fillings  Crowns and Bridges  Dentures and Partial Dentures  Implant Services  Periodontal Services and Cleanings  Cosmetic Statistician  3[facility] [facility]    [employer] [employer] Guide Dental The United Ways 211 is a great source of information about community services available.  Access by dialing 2-1-1 from anywhere in [place], or by website -  CustodianSupply.fi.   Other Local Resources (Updated 12/2015)  Dental  Care   Services    Phone Number and Address  Cost    [facility] For children 15 - 19 years of age:   Cleaning  Tooth brushing/flossing instruction  Sealants, fillings, crowns  Extractions  Emergency treatment  [phone] [address], [place] [address] Charges based on family income.  Medicaid  and some insurance plans accepted.     [place] [facility] - [facility], fillings, crowns  Extractions  Emergency treatment [phone] [address] [place], [place]  Pregnant women 62 years of age or older with a Medicaid card  [place] [facility] - [place]  Cleaning  Sealants, fillings, crowns  Extractions  Emergency treatment [phone] [address] [place], [place] Pregnant women 38 years of age or older with a Medicaid card  [facility] For children 66 - 79 years of age:   Cleaning  Tooth brushing/flossing instruction  Sealants, fillings, crowns  Extractions  Emergency treatment Limited orthodontic services for patients with Medicaid [phone] [address] [place], [place] [address] Medicaid and [facility] Health Choice cover for children up to age 36 and pregnant women.  Parents of children up to age 61 without Medicaid pay a reduced fee at time of service.  [place] For children 46 - 19 years of age:   Cleaning  Tooth brushing/flossing instruction  Sealants, fillings, crowns  Extractions  Emergency treatment Limited orthodontic services for patients with Medicaid [phone] [place], [place].  Medicaid and [place] Health Choice cover for children up to age 37 and pregnant women.  Parents of children up to age 67 without Medicaid pay a reduced fee.  [facility]  Sealants, fillings, crowns  Extractions  Hours: Tuesdays and Thursdays, 4:15 - 8 pm [phone] [address] [place], [place] [address] Services free of charge to [facility] residents ages 18-64 who do not have health insurance, Medicare, [place], or [place] benefits and fall within federal poverty guidelines  [place] care in addition to primary medical care, nutritional counseling, and pharmacy:  Engineer, drilling, fillings, crowns  Extractions                  [phone] [facility], [place], [place], [place], [place], [facility], [place], [facility] [place], [place], [place], most insurance.  Also provides services available to all with fees adjusted based on ability to pay.    [facility]  Cleaning  Tooth brushing/flossing instruction  Sealants, fillings, crowns  Extractions  Emergency treatment Hours: Tuesdays, Thursdays, and Fridays from 8 am to 5 pm by appointment only. [phone] [place], [place] [address] [facility] residents with Medicaid (depending on eligibility) and children with [facility] Health Choice - call for more information.  [facility]  Extractions only  Hours: 2nd and 4th Thursday of each month from 6:30 am - 9 am.   [phone] ext. [place], [place] [address] Ages 39 and older only.  Patients are seen on a first come, first served basis.  [facility]  Fillings  Extractions  Orthodontics  Endodontics  Implants/Crowns/Bridges  Complete and partial dentures [phone] [facility] [place] Patients must complete an application for services.  There is often a waiting list.

## 2016-07-02 ENCOUNTER — Ambulatory Visit: Payer: Self-pay

## 2016-07-02 ENCOUNTER — Ambulatory Visit: Payer: Self-pay | Admitting: Infectious Diseases

## 2016-07-05 ENCOUNTER — Ambulatory Visit: Payer: Self-pay

## 2016-08-13 ENCOUNTER — Encounter: Payer: Self-pay | Admitting: Infectious Diseases

## 2016-08-13 ENCOUNTER — Ambulatory Visit (INDEPENDENT_AMBULATORY_CARE_PROVIDER_SITE_OTHER): Payer: Self-pay | Admitting: Infectious Diseases

## 2016-08-13 VITALS — BP 162/99 | HR 58 | Temp 98.0°F | Wt 240.0 lb

## 2016-08-13 DIAGNOSIS — I10 Essential (primary) hypertension: Secondary | ICD-10-CM

## 2016-08-13 DIAGNOSIS — B20 Human immunodeficiency virus [HIV] disease: Secondary | ICD-10-CM

## 2016-08-13 DIAGNOSIS — K047 Periapical abscess without sinus: Secondary | ICD-10-CM

## 2016-08-13 MED ORDER — AMOXICILLIN-POT CLAVULANATE 875-125 MG PO TABS: 1.0000 | ORAL_TABLET | Freq: Two times a day (BID) | ORAL | 0 refills | Status: AC

## 2016-08-13 MED ORDER — LIDOCAINE VISCOUS 2 % MT SOLN: 20.0000 mL | OROMUCOSAL | 0 refills | Status: DC | PRN

## 2016-08-13 NOTE — Assessment & Plan Note (Signed)
Assume due to dental pain today Will f/u at next visit.

## 2016-08-13 NOTE — Assessment & Plan Note (Signed)
Will give her 2 weeks of augmentin (has prev tolerated zosyn) Will give her lidocaine s/s Will have her seen in dental clinic.

## 2016-08-13 NOTE — Progress Notes (Signed)
   Subjective:    Patient ID: Katie Doyle, female    DOB: 11-20-1966, 50 y.o.   MRN: [id number]  HPI 50 yo F with HTN (no PCP) and HIV+. She was dx October 2015 and was lost to f/u. She was re-tested when she was incarcerated. Pt also has hx of substance abuse (crack).  Last seen Jan 2017. Has been to ED for dental issues, has abscess lanced. Was given 7 days of clindamycin.  She is having severe pain, having difficulty swallowing.  Has been able to take ART.   HIV 1 RNA Quant (copies/mL)  Date Value  12/29/2015 83 (H)  08/01/2015 69 (H)  06/01/2015 7,775 (H)   CD4 T Cell Abs (/uL)  Date Value  12/29/2015 610  08/01/2015 730  06/01/2015 620   PAP negative 01-2016 (trich present, tx). Missed mammo.  Review of Systems     Objective:   Physical Exam  Constitutional: She appears well-developed and well-nourished. She appears distressed.  HENT:  Mouth/Throat: No oropharyngeal exudate.    Eyes: EOM are normal. Pupils are equal, round, and reactive to light.  Neck: Neck supple.  Cardiovascular: Normal rate, regular rhythm and normal heart sounds.   Pulmonary/Chest: Effort normal and breath sounds normal.  Abdominal: Soft. Bowel sounds are normal. There is no tenderness. There is no rebound.  Lymphadenopathy:    She has no cervical adenopathy.       Assessment & Plan:

## 2016-08-13 NOTE — Assessment & Plan Note (Signed)
Will check her HIV and CD4 today Given condoms Will schedule for mammo at 50 yo (no Fhx) Will see her back in 6 months.

## 2016-08-14 LAB — HIV-1 RNA QUANT-NO REFLEX-BLD: HIV-1 RNA Quant, Log: <1.3 Log copies/mL (ref <1.30)

## 2016-08-14 LAB — T-HELPER CELL (CD4) - (RCID CLINIC ONLY)
CD4 T CELL HELPER: 33 % (ref 33–55)
CD4 T Cell Abs: 700 /uL (ref 400–2700)

## 2016-09-10 ENCOUNTER — Telehealth: Payer: Self-pay | Admitting: *Deleted

## 2016-09-10 ENCOUNTER — Other Ambulatory Visit: Payer: Self-pay | Admitting: *Deleted

## 2016-09-10 DIAGNOSIS — K047 Periapical abscess without sinus: Secondary | ICD-10-CM

## 2016-09-10 MED ORDER — LIDOCAINE VISCOUS 2 % MT SOLN: 10.0000 mL | OROMUCOSAL | 0 refills | Status: DC | PRN

## 2016-09-10 NOTE — Telephone Encounter (Signed)
Patient requesting refills of lidocaine swish, still having dental pain, still has stitches in her mouth, states that she does not have a follow up with dental yet.  Pt will also call [facility]celyn Schmid at [facility] for dental information. Please advise on lidocaine swish. [name], RN

## 2016-09-10 NOTE — Telephone Encounter (Signed)
Viscous Lidocaine 2% swish and spit 10 mL q6 hours prn pain fine with me Disp 150 mL.  thanks

## 2016-09-24 ENCOUNTER — Encounter: Payer: Self-pay | Admitting: Infectious Diseases

## 2016-09-24 ENCOUNTER — Ambulatory Visit (INDEPENDENT_AMBULATORY_CARE_PROVIDER_SITE_OTHER): Payer: Self-pay | Admitting: Infectious Diseases

## 2016-09-24 VITALS — BP 152/93 | HR 73 | Temp 98.3°F | Ht 65.0 in | Wt 241.0 lb

## 2016-09-24 DIAGNOSIS — J209 Acute bronchitis, unspecified: Secondary | ICD-10-CM

## 2016-09-24 DIAGNOSIS — B20 Human immunodeficiency virus [HIV] disease: Secondary | ICD-10-CM

## 2016-09-24 MED ORDER — HYDROCOD POLST-CPM POLST ER 10-8 MG/5ML PO SUER: 5.0000 mL | Freq: Two times a day (BID) | ORAL | Status: DC | PRN

## 2016-09-24 MED ORDER — AZITHROMYCIN 500 MG PO TABS: 500.0000 mg | ORAL_TABLET | Freq: Every day | ORAL | 0 refills | Status: DC

## 2016-09-24 MED ORDER — HYDROCOD POLST-CPM POLST ER 10-8 MG/5ML PO SUER: 5.0000 mL | Freq: Two times a day (BID) | ORAL | 0 refills | Status: AC | PRN

## 2016-09-24 NOTE — Addendum Note (Signed)
Addended by: Cartha Rotert C on: 09/24/2016 04:01 PM   Modules accepted: Orders

## 2016-09-24 NOTE — Assessment & Plan Note (Signed)
Will give her tripack tussionex

## 2016-09-24 NOTE — Progress Notes (Signed)
   Subjective:    Patient ID: Katie Doyle, female    DOB: 11/09/1966, 50 y.o.   MRN: [id number]  HPI 50 yo F with HTN (no PCP) and HIV+. She was dx October 2015 and was lost to f/u. She was re-tested when she was incarcerated. Pt also has hx of substance abuse (crack).  Last seen Jan 2017. Has been to ED for dental issues, has abscess lanced. Has since had multiple teeth extracted (~ 2 weeks ago).   Today complains of cold sx. Has been on nyquil, alka-seltzer. Has been having muscle aches. Deep chest cough (thick yellow), pleuritic pain, rhinorrhea (clear). Feels hot and sweaty.   HIV 1 RNA Quant (copies/mL)  Date Value  08/13/2016 <20  12/29/2015 83 (H)  08/01/2015 69 (H)   CD4 T Cell Abs (/uL)  Date Value  08/13/2016 700  12/29/2015 610  08/01/2015 730    Review of Systems  HENT: Positive for rhinorrhea.   Respiratory: Positive for cough.   Gastrointestinal: Negative for constipation and diarrhea.  Genitourinary: Negative for difficulty urinating.   NL PAP 01-2016    Objective:   Physical Exam  Constitutional: She appears well-developed and well-nourished.  HENT:  Mouth/Throat: No oropharyngeal exudate.  Eyes: EOM are normal. Pupils are equal, round, and reactive to light.  Neck: Neck supple.  Cardiovascular: Normal rate, regular rhythm and normal heart sounds.   Pulmonary/Chest: Effort normal. She has rhonchi.  Abdominal: Soft. Bowel sounds are normal. There is no tenderness. There is no rebound.  Musculoskeletal: She exhibits no edema.  Lymphadenopathy:    She has no cervical adenopathy.      Assessment & Plan:

## 2016-09-24 NOTE — Assessment & Plan Note (Signed)
Has been doing well Flu shot at next visit.  rtc as prev scheduled.

## 2016-10-04 ENCOUNTER — Ambulatory Visit: Payer: Self-pay

## 2016-10-08 ENCOUNTER — Other Ambulatory Visit: Payer: Self-pay | Admitting: *Deleted

## 2016-10-08 ENCOUNTER — Ambulatory Visit: Payer: Self-pay

## 2016-10-08 DIAGNOSIS — B20 Human immunodeficiency virus [HIV] disease: Secondary | ICD-10-CM

## 2016-10-08 MED ORDER — ELVITEG-COBIC-EMTRICIT-TENOFAF 150-150-200-10 MG PO TABS
1.0000 | ORAL_TABLET | Freq: Every day | ORAL | 3 refills | Status: DC
Start: 2016-10-08 — End: 2017-04-22

## 2016-10-10 ENCOUNTER — Encounter: Payer: Self-pay | Admitting: Infectious Diseases

## 2016-12-04 ENCOUNTER — Other Ambulatory Visit: Payer: Self-pay

## 2016-12-19 ENCOUNTER — Other Ambulatory Visit: Payer: Self-pay

## 2016-12-25 ENCOUNTER — Ambulatory Visit: Payer: Self-pay | Admitting: Infectious Diseases

## 2017-01-24 ENCOUNTER — Other Ambulatory Visit: Payer: Self-pay

## 2017-01-30 ENCOUNTER — Other Ambulatory Visit: Payer: Self-pay

## 2017-02-01 ENCOUNTER — Other Ambulatory Visit: Payer: Self-pay | Admitting: Infectious Diseases

## 2017-02-01 DIAGNOSIS — B20 Human immunodeficiency virus [HIV] disease: Secondary | ICD-10-CM

## 2017-02-11 ENCOUNTER — Ambulatory Visit: Payer: Self-pay | Admitting: Infectious Diseases

## 2017-04-18 ENCOUNTER — Ambulatory Visit: Payer: Self-pay

## 2017-04-22 ENCOUNTER — Emergency Department (HOSPITAL_COMMUNITY)
Admission: EM | Admit: 2017-04-22 | Discharge: 2017-04-24 | Disposition: A | Discharge status: Home / Self Care | Payer: Self-pay | Attending: Emergency Medicine | Admitting: Emergency Medicine

## 2017-04-22 ENCOUNTER — Encounter (HOSPITAL_COMMUNITY): Payer: Self-pay | Admitting: Emergency Medicine

## 2017-04-22 DIAGNOSIS — B20 Human immunodeficiency virus [HIV] disease: Secondary | ICD-10-CM

## 2017-04-22 DIAGNOSIS — Z87891 Personal history of nicotine dependence: Secondary | ICD-10-CM | POA: Insufficient documentation

## 2017-04-22 DIAGNOSIS — F1420 Cocaine dependence, uncomplicated: Secondary | ICD-10-CM

## 2017-04-22 DIAGNOSIS — I10 Essential (primary) hypertension: Secondary | ICD-10-CM | POA: Insufficient documentation

## 2017-04-22 DIAGNOSIS — Z8673 Personal history of transient ischemic attack (TIA), and cerebral infarction without residual deficits: Secondary | ICD-10-CM | POA: Insufficient documentation

## 2017-04-22 DIAGNOSIS — F333 Major depressive disorder, recurrent, severe with psychotic symptoms: Secondary | ICD-10-CM | POA: Insufficient documentation

## 2017-04-22 DIAGNOSIS — F1494 Cocaine use, unspecified with cocaine-induced mood disorder: Secondary | ICD-10-CM

## 2017-04-22 DIAGNOSIS — J45909 Unspecified asthma, uncomplicated: Secondary | ICD-10-CM | POA: Insufficient documentation

## 2017-04-22 LAB — CBC
HEMATOCRIT: 43.2 % (ref 36.0–46.0)
Hemoglobin: 14.1 g/dL (ref 12.0–15.0)
MCH: 30.5 pg (ref 26.0–34.0)
MCHC: 32.6 g/dL (ref 30.0–36.0)
MCV: 93.3 fL (ref 78.0–100.0)
Platelets: 325 10*3/uL (ref 150–400)
RBC: 4.63 MIL/uL (ref 3.87–5.11)
RDW: 14.2 % (ref 11.5–15.5)
WBC: 11.1 10*3/uL — AB (ref 4.0–10.5)

## 2017-04-22 LAB — RAPID URINE DRUG SCREEN, HOSP PERFORMED
AMPHETAMINES: NOT DETECTED
BARBITURATES: NOT DETECTED
BENZODIAZEPINES: NOT DETECTED
Cocaine: POSITIVE — AB
Opiates: NOT DETECTED
Tetrahydrocannabinol: POSITIVE — AB

## 2017-04-22 LAB — COMPREHENSIVE METABOLIC PANEL
ALBUMIN: 4.2 g/dL (ref 3.5–5.0)
ALK PHOS: 160 U/L — AB (ref 38–126)
ALT: 13 U/L — AB (ref 14–54)
AST: 18 U/L (ref 15–41)
Anion gap: 9 (ref 5–15)
BILIRUBIN TOTAL: 0.3 mg/dL (ref 0.3–1.2)
BUN: 10 mg/dL (ref 6–20)
CO2: 27 mmol/L (ref 22–32)
CREATININE: 0.86 mg/dL (ref 0.44–1.00)
Calcium: 9.5 mg/dL (ref 8.9–10.3)
Chloride: 103 mmol/L (ref 101–111)
GFR calc Af Amer: >60 mL/min (ref >60)
GLUCOSE: 94 mg/dL (ref 65–99)
POTASSIUM: 3.3 mmol/L — AB (ref 3.5–5.1)
SODIUM: 139 mmol/L (ref 135–145)
TOTAL PROTEIN: 8.4 g/dL — AB (ref 6.5–8.1)

## 2017-04-22 LAB — SALICYLATE LEVEL: Salicylate Lvl: <7 mg/dL (ref 2.8–30.0)

## 2017-04-22 LAB — ACETAMINOPHEN LEVEL: Acetaminophen (Tylenol), Serum: <10 ug/mL — ABNORMAL LOW (ref 10–30)

## 2017-04-22 LAB — ETHANOL: Alcohol, Ethyl (B): <5 mg/dL (ref <5)

## 2017-04-22 MED ORDER — LISINOPRIL 20 MG PO TABS
20.0000 mg | ORAL_TABLET | Freq: Every day | ORAL | Status: DC
  Administered 2017-04-23 – 2017-04-24 (×2): 20 mg via ORAL
  Filled 2017-04-22 (×2): qty 1

## 2017-04-22 MED ORDER — HYDROCHLOROTHIAZIDE 25 MG PO TABS
25.0000 mg | ORAL_TABLET | Freq: Every day | ORAL | Status: DC
  Administered 2017-04-23 – 2017-04-24 (×2): 25 mg via ORAL
  Filled 2017-04-22 (×2): qty 1

## 2017-04-22 MED ORDER — TRAZODONE HCL 50 MG PO TABS
50.0000 mg | ORAL_TABLET | Freq: Every evening | ORAL | Status: DC | PRN
  Administered 2017-04-23: 50 mg via ORAL
  Filled 2017-04-22: qty 1

## 2017-04-22 MED ORDER — ONDANSETRON 4 MG PO TBDP
4.0000 mg | ORAL_TABLET | Freq: Once | ORAL | Status: AC
  Administered 2017-04-22: 4 mg via ORAL
  Filled 2017-04-22: qty 1

## 2017-04-22 MED ORDER — ELVITEG-COBIC-EMTRICIT-TENOFAF 150-150-200-10 MG PO TABS
1.0000 | ORAL_TABLET | Freq: Every day | ORAL | Status: DC
  Administered 2017-04-23 – 2017-04-24 (×2): 1 via ORAL
  Filled 2017-04-22 (×2): qty 1
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
## 2017-04-22 NOTE — ED Notes (Signed)
Pt stated "I went to [facility] today and they told me they were going to look for LTC for me.  I woke up this morning and said  I'm tired of doing this, so I called my aunt and she told me to go get help.  I'm depressed, have talked to Dr. Johnnye Sima about it once.  I've never taken medicine."
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
## 2017-04-22 NOTE — ED Triage Notes (Signed)
Pt verbalizes sent from [facility] for medical clearance. Pt requesting detox from cocaine and ETOH last use this morning 0700. Pt denies pain. Pt denies SI/HI.

## 2017-04-22 NOTE — ED Notes (Signed)
Pt c/o nausea.  Informed will have to speak to EDP.  Pt verbalized understanding.

## 2017-04-23 DIAGNOSIS — F1420 Cocaine dependence, uncomplicated: Secondary | ICD-10-CM

## 2017-04-23 DIAGNOSIS — R45851 Suicidal ideations: Secondary | ICD-10-CM

## 2017-04-23 DIAGNOSIS — F1494 Cocaine use, unspecified with cocaine-induced mood disorder: Secondary | ICD-10-CM

## 2017-04-23 DIAGNOSIS — F332 Major depressive disorder, recurrent severe without psychotic features: Secondary | ICD-10-CM

## 2017-04-23 MED ORDER — GABAPENTIN 300 MG PO CAPS
300.0000 mg | ORAL_CAPSULE | Freq: Two times a day (BID) | ORAL | Status: DC
  Administered 2017-04-23 – 2017-04-24 (×3): 300 mg via ORAL
  Filled 2017-04-23 (×2): qty 1

## 2017-04-23 MED ORDER — TRAZODONE HCL 50 MG PO TABS
50.0000 mg | ORAL_TABLET | Freq: Every day | ORAL | Status: DC
  Administered 2017-04-23: 50 mg via ORAL
  Filled 2017-04-23 (×2): qty 1
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
## 2017-04-23 NOTE — ED Notes (Signed)
Patient presents calm and cooperative. Patient is resting in bed watching TV with no current questions or concerns. Patient denies SI/HI/AVH but endorses need for further substance abuse treatment stating "I want to get better this time, for my family". Patient is agreeable to current plan of care. VSS. Patient denies pain. Patient medicated with orders as prescribed. Medications reviewed with patient; patient verbalized understanding. Snacks and fluids provided throughout shift. Patient safety maintained with q15 minute safety checks, hourly rounding and camera surveillance. Patient remains safe on the unit at this time.

## 2017-04-23 NOTE — ED Notes (Signed)
Pt calm and cooperative. Pt reports increase depression from not able to spend time with grandchildren and home environment. Pt report she was diagnosed with HIV almost two years ago. Pt reports using cocaine, THC, and ETOH daily for the past four years. Pt denies SI/HI. Pt endorses AH telling her bad things and encouraging her to continue using drugs. Pt reports feeling safe here and wants to go to a long term treatment after discharge.

## 2017-04-23 NOTE — Progress Notes (Signed)
04/23/17 1349:  LRT introduced self to pt and offered activities, pt stated she wanted some word searches.  LRT made copies of word searches for pt and gave them to her.   Victorino Sparrow, LRT/CTRS

## 2017-04-23 NOTE — ED Notes (Signed)
Pt has been pleasant and cooperative today.  No visible symptoms of Withdrawal noted.  Patient contracts for safety.

## 2017-04-24 ENCOUNTER — Ambulatory Visit: Payer: Self-pay

## 2017-04-24 ENCOUNTER — Other Ambulatory Visit: Payer: Self-pay | Admitting: *Deleted

## 2017-04-24 DIAGNOSIS — F1494 Cocaine use, unspecified with cocaine-induced mood disorder: Secondary | ICD-10-CM

## 2017-04-24 DIAGNOSIS — B20 Human immunodeficiency virus [HIV] disease: Secondary | ICD-10-CM

## 2017-04-24 DIAGNOSIS — F1290 Cannabis use, unspecified, uncomplicated: Secondary | ICD-10-CM

## 2017-04-24 DIAGNOSIS — Z87891 Personal history of nicotine dependence: Secondary | ICD-10-CM

## 2017-04-24 DIAGNOSIS — Z830 Family history of human immunodeficiency virus [HIV] disease: Secondary | ICD-10-CM

## 2017-04-24 MED ORDER — GABAPENTIN 300 MG PO CAPS: 300.0000 mg | ORAL_CAPSULE | Freq: Two times a day (BID) | ORAL | 0 refills | Status: DC

## 2017-04-24 MED ORDER — TRAZODONE HCL 50 MG PO TABS: 50.0000 mg | ORAL_TABLET | Freq: Every day | ORAL | 0 refills | Status: DC

## 2017-04-24 MED ORDER — ELVITEG-COBIC-EMTRICIT-TENOFAF 150-150-200-10 MG PO TABS: 1.0000 | ORAL_TABLET | Freq: Every day | ORAL | 2 refills | Status: DC

## 2017-04-24 MED ORDER — LISINOPRIL 20 MG PO TABS: 20.0000 mg | ORAL_TABLET | Freq: Every day | ORAL | 0 refills | Status: DC

## 2017-04-24 MED ORDER — HYDROCHLOROTHIAZIDE 25 MG PO TABS: 25.0000 mg | ORAL_TABLET | Freq: Every day | ORAL | 0 refills | Status: DC

## 2017-04-24 NOTE — ED Notes (Signed)
On the phone 

## 2017-04-24 NOTE — ED Notes (Addendum)
Pt has been able to contact friend for ride home.  Written dc instructions and prescriptions reviewed w/ pt.  Pt encouraged to take medications as directed and keep her follow up appointments as scheduled.  Pt denies si/hi/avh at this time of dc and verbalized understanding of follow up. Pt ambulatory to dc area w/ mHt, belongings returned after leaving the area.

## 2017-04-24 NOTE — ED Notes (Addendum)
CSW case manger into see
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
## 2017-04-24 NOTE — ED Notes (Signed)
In the bathroom

## 2017-04-24 NOTE — BH Assessment (Signed)
[facility] Assessment Progress Note  Per Corena Pilgrim, MD, this pt does not require psychiatric hospitalization at this time.  Pt is to be discharged from [facility] with referral information for area substance abuse treatment providers.  This has been included in pt's discharge instructions.  Pt's nurse, Narda Rutherford, has been notified.  Jalene Mullet, [place] Triage Specialist [phone]

## 2017-04-24 NOTE — Discharge Instructions (Signed)
To help you maintain a sober lifestyle, a substance abuse treatment program may be beneficial to you.  Contact one of the following facilities at your earliest opportunity to ask about enrolling:  RESIDENTIAL PROGRAMS:       [facility]      [place], [place] [address]      [phone]       [facility]      [address] [place], [place] [address]      [phone]   OUTPATIENT PROGRAMS:       [facility]      [address] [place]. [place], [place] [address]      [phone]      New patients are seen at the walk-in clinic every Tuesday from 9:00 am - 12:00 pm.
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
## 2017-04-29 ENCOUNTER — Encounter: Payer: Self-pay | Admitting: Infectious Diseases

## 2017-05-14 ENCOUNTER — Other Ambulatory Visit: Payer: Self-pay

## 2017-05-27 ENCOUNTER — Ambulatory Visit: Payer: Self-pay | Admitting: Infectious Diseases

## 2017-06-26 ENCOUNTER — Other Ambulatory Visit: Payer: Self-pay

## 2017-07-10 ENCOUNTER — Ambulatory Visit: Payer: Self-pay | Admitting: Infectious Diseases

## 2017-07-15 ENCOUNTER — Other Ambulatory Visit: Payer: Self-pay

## 2017-07-15 ENCOUNTER — Telehealth: Payer: Self-pay | Admitting: *Deleted

## 2017-07-15 DIAGNOSIS — Z79899 Other long term (current) drug therapy: Secondary | ICD-10-CM

## 2017-07-15 DIAGNOSIS — B20 Human immunodeficiency virus [HIV] disease: Secondary | ICD-10-CM

## 2017-07-15 DIAGNOSIS — K1379 Other lesions of oral mucosa: Secondary | ICD-10-CM

## 2017-07-15 DIAGNOSIS — Z113 Encounter for screening for infections with a predominantly sexual mode of transmission: Secondary | ICD-10-CM

## 2017-07-15 LAB — LIPID PANEL
CHOL/HDL RATIO: 2.2 ratio (ref <5.0)
CHOLESTEROL: 201 mg/dL — AB (ref <200)
HDL: 92 mg/dL (ref >50)
LDL CALC: 97 mg/dL (ref <100)
Triglycerides: 58 mg/dL (ref <150)
VLDL: 12 mg/dL (ref <30)

## 2017-07-15 LAB — CBC WITH DIFFERENTIAL/PLATELET
BASOS PCT: 0 %
Basophils Absolute: 0 cells/uL (ref 0–200)
EOS PCT: 2 %
Eosinophils Absolute: 182 cells/uL (ref 15–500)
HEMATOCRIT: 37.3 % (ref 35.0–45.0)
Hemoglobin: 12.2 g/dL (ref 11.7–15.5)
LYMPHS PCT: 26 %
Lymphs Abs: 2366 cells/uL (ref 850–3900)
MCH: 30.7 pg (ref 27.0–33.0)
MCHC: 32.7 g/dL (ref 32.0–36.0)
MCV: 93.7 fL (ref 80.0–100.0)
MONO ABS: 546 {cells}/uL (ref 200–950)
MPV: 10.6 fL (ref 7.5–12.5)
Monocytes Relative: 6 %
NEUTROS ABS: 6006 {cells}/uL (ref 1500–7800)
NEUTROS PCT: 66 %
PLATELETS: 319 10*3/uL (ref 140–400)
RBC: 3.98 MIL/uL (ref 3.80–5.10)
RDW: 15.4 % — AB (ref 11.0–15.0)
WBC: 9.1 10*3/uL (ref 3.8–10.8)

## 2017-07-15 LAB — COMPREHENSIVE METABOLIC PANEL
ALK PHOS: 118 U/L — AB (ref 33–115)
ALT: 8 U/L (ref 6–29)
AST: 13 U/L (ref 10–35)
Albumin: 3.8 g/dL (ref 3.6–5.1)
BILIRUBIN TOTAL: 0.3 mg/dL (ref 0.2–1.2)
BUN: 13 mg/dL (ref 7–25)
CO2: 27 mmol/L (ref 20–31)
CREATININE: 0.78 mg/dL (ref 0.50–1.10)
Calcium: 8.9 mg/dL (ref 8.6–10.2)
Chloride: 104 mmol/L (ref 98–110)
GLUCOSE: 67 mg/dL (ref 65–99)
Potassium: 4 mmol/L (ref 3.5–5.3)
SODIUM: 141 mmol/L (ref 135–146)
Total Protein: 6.5 g/dL (ref 6.1–8.1)

## 2017-07-15 NOTE — Telephone Encounter (Signed)
Patient with called to advise that her mouth abcess is back and she needs number for dental clinic and mouth rinse that Dr Johnnye Sima usually gives her for the pain and antibiotic as well. Advised her he is out of the office until next week ut will ask one of his partners if they would give the ok for the medication. She will call [facility] in the mean time and get an appointment AND follow through.     This is what Johnnye Sima usually gives her for a rinse: Katie Riches, MD  to [name], RN     09/10/16 4:27 PM  Note    Viscous Lidocaine 2% swish and spit 10 mL q6 hours prn pain fine with me Disp 150 mL.  thanks    As well as Augmentin 875-125 1 tablet twice daily for 14 days #28  Is this ok to order for the patient? She is visibly swollen?

## 2017-07-16 LAB — RPR

## 2017-07-16 LAB — T-HELPER CELL (CD4) - (RCID CLINIC ONLY)
CD4 % Helper T Cell: 37 % (ref 33–55)
CD4 T CELL ABS: 920 /uL (ref 400–2700)

## 2017-07-16 LAB — URINE CYTOLOGY ANCILLARY ONLY
Chlamydia: NEGATIVE
Neisseria Gonorrhea: NEGATIVE

## 2017-07-17 ENCOUNTER — Encounter: Payer: Self-pay | Admitting: Infectious Diseases

## 2017-07-18 LAB — HIV-1 RNA QUANT-NO REFLEX-BLD
HIV 1 RNA Quant: <20 copies/mL
HIV-1 RNA Quant, Log: <1.3 Log copies/mL

## 2017-07-23 NOTE — Telephone Encounter (Signed)
Ok to refill these She needs to be seen in dental thanks

## 2017-07-24 MED ORDER — LIDOCAINE VISCOUS 2 % MT SOLN: 10.0000 mL | Freq: Four times a day (QID) | OROMUCOSAL | 0 refills | Status: DC | PRN

## 2017-07-24 MED ORDER — AMOXICILLIN-POT CLAVULANATE 875-125 MG PO TABS: 1.0000 | ORAL_TABLET | Freq: Two times a day (BID) | ORAL | 0 refills | Status: DC

## 2017-07-24 NOTE — Addendum Note (Signed)
Addended by: Janyce Llanos F on: 07/24/2017 11:10 AM   Modules accepted: Orders

## 2017-07-24 NOTE — Addendum Note (Signed)
Addended by: Reggy Eye on: 07/24/2017 10:15 AM   Modules accepted: Orders

## 2017-09-09 ENCOUNTER — Encounter (HOSPITAL_COMMUNITY): Payer: Self-pay | Admitting: *Deleted

## 2017-09-09 ENCOUNTER — Emergency Department (HOSPITAL_COMMUNITY): Payer: No Typology Code available for payment source

## 2017-09-09 ENCOUNTER — Emergency Department (HOSPITAL_COMMUNITY)
Admission: EM | Admit: 2017-09-09 | Discharge: 2017-09-09 | Disposition: A | Discharge status: Home / Self Care | Payer: No Typology Code available for payment source | Attending: Emergency Medicine | Admitting: Emergency Medicine

## 2017-09-09 DIAGNOSIS — J45909 Unspecified asthma, uncomplicated: Secondary | ICD-10-CM | POA: Insufficient documentation

## 2017-09-09 DIAGNOSIS — M25561 Pain in right knee: Secondary | ICD-10-CM | POA: Insufficient documentation

## 2017-09-09 DIAGNOSIS — Z8673 Personal history of transient ischemic attack (TIA), and cerebral infarction without residual deficits: Secondary | ICD-10-CM | POA: Insufficient documentation

## 2017-09-09 DIAGNOSIS — Z87891 Personal history of nicotine dependence: Secondary | ICD-10-CM | POA: Insufficient documentation

## 2017-09-09 DIAGNOSIS — M25562 Pain in left knee: Secondary | ICD-10-CM | POA: Insufficient documentation

## 2017-09-09 DIAGNOSIS — I10 Essential (primary) hypertension: Secondary | ICD-10-CM | POA: Diagnosis not present

## 2017-09-09 DIAGNOSIS — M25511 Pain in right shoulder: Secondary | ICD-10-CM | POA: Insufficient documentation

## 2017-09-09 DIAGNOSIS — M25551 Pain in right hip: Secondary | ICD-10-CM | POA: Diagnosis not present

## 2017-09-09 DIAGNOSIS — B20 Human immunodeficiency virus [HIV] disease: Secondary | ICD-10-CM | POA: Diagnosis not present

## 2017-09-09 LAB — I-STAT CHEM 8, ED
BUN: 14 mg/dL (ref 6–20)
CALCIUM ION: 1.08 mmol/L — AB (ref 1.15–1.40)
Chloride: 103 mmol/L (ref 101–111)
Creatinine, Ser: 0.8 mg/dL (ref 0.44–1.00)
Glucose, Bld: 85 mg/dL (ref 65–99)
HEMATOCRIT: 39 % (ref 36.0–46.0)
HEMOGLOBIN: 13.3 g/dL (ref 12.0–15.0)
Potassium: 3.7 mmol/L (ref 3.5–5.1)
SODIUM: 140 mmol/L (ref 135–145)
TCO2: 26 mmol/L (ref 22–32)

## 2017-09-09 LAB — CBC WITH DIFFERENTIAL/PLATELET
BASOS ABS: 0 10*3/uL (ref 0.0–0.1)
BASOS PCT: 0 %
EOS ABS: 0.1 10*3/uL (ref 0.0–0.7)
Eosinophils Relative: 1 %
HEMATOCRIT: 37.6 % (ref 36.0–46.0)
HEMOGLOBIN: 12.2 g/dL (ref 12.0–15.0)
Lymphocytes Relative: 31 %
Lymphs Abs: 3.1 10*3/uL (ref 0.7–4.0)
MCH: 29.6 pg (ref 26.0–34.0)
MCHC: 32.4 g/dL (ref 30.0–36.0)
MCV: 91.3 fL (ref 78.0–100.0)
Monocytes Absolute: 0.6 10*3/uL (ref 0.1–1.0)
Monocytes Relative: 6 %
NEUTROS ABS: 6.1 10*3/uL (ref 1.7–7.7)
NEUTROS PCT: 62 %
Platelets: 292 10*3/uL (ref 150–400)
RBC: 4.12 MIL/uL (ref 3.87–5.11)
RDW: 14.1 % (ref 11.5–15.5)
WBC: 9.9 10*3/uL (ref 4.0–10.5)

## 2017-09-09 MED ORDER — OXYCODONE-ACETAMINOPHEN 5-325 MG PO TABS
ORAL_TABLET | ORAL | Status: AC
  Filled 2017-09-09: qty 1

## 2017-09-09 MED ORDER — OXYCODONE-ACETAMINOPHEN 5-325 MG PO TABS
1.0000 | ORAL_TABLET | ORAL | Status: DC | PRN
  Administered 2017-09-09: 1 via ORAL

## 2017-09-09 MED ORDER — KETOROLAC TROMETHAMINE 30 MG/ML IJ SOLN
30.0000 mg | Freq: Once | INTRAMUSCULAR | Status: AC
  Administered 2017-09-09: 30 mg via INTRAMUSCULAR
  Filled 2017-09-09: qty 1

## 2017-09-09 MED ORDER — HYDROCODONE-ACETAMINOPHEN 5-325 MG PO TABS: 1.0000 | ORAL_TABLET | Freq: Four times a day (QID) | ORAL | 0 refills | Status: DC | PRN

## 2017-09-09 NOTE — Discharge Instructions (Signed)
Your x-rays are reassuring. You do not have any broken bones or serious injury.  You will be very sore over the next few days. This is likely due to bruising and muscle strain.  Use heat packs, ibuprofen primarily for pain. Norco for breakthrough pain.
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
## 2017-09-09 NOTE — ED Notes (Signed)
Patient transported to X-ray 
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
## 2017-09-16 ENCOUNTER — Ambulatory Visit: Payer: Self-pay | Admitting: Infectious Diseases

## 2017-09-30 ENCOUNTER — Ambulatory Visit: Payer: Self-pay | Admitting: Infectious Diseases

## 2018-02-06 ENCOUNTER — Ambulatory Visit: Payer: Self-pay

## 2018-02-10 ENCOUNTER — Ambulatory Visit: Payer: Self-pay

## 2018-04-03 ENCOUNTER — Ambulatory Visit: Payer: Self-pay

## 2018-05-19 ENCOUNTER — Ambulatory Visit: Payer: Self-pay

## 2018-05-19 ENCOUNTER — Telehealth: Payer: Self-pay | Admitting: Pharmacist Clinician (PhC)/ Clinical Pharmacy Specialist

## 2018-05-19 NOTE — Telephone Encounter (Signed)
HIV Genotype Composite Data Genotype Dates: 01/25/15  Mutations in [place] impact drug susceptibility RT Mutations L210W, T215D, V108I, V179D, Y181I  PI Mutations None  Integrase Mutations None   Interpretation of Genotype Data per [employer] HIV Database Nucleoside RTIs  abacavir (ABC) Low-Level Resistance zidovudine (AZT) Intermediate Resistance emtricitabine (FTC) Susceptible lamivudine (3TC) Susceptible tenofovir (TDF) Low-Level Resistance   Non-Nucleoside RTIs  doravirine (DOR) Intermediate Resistance efavirenz (EFV) Intermediate Resistance etravirine (ETR) High-Level Resistance nevirapine (NVP) High-Level Resistance rilpivirine (RPV) High-Level Resistance   Protease Inhibitors     Integrase Inhibitors

## 2018-07-30 ENCOUNTER — Ambulatory Visit: Payer: Self-pay

## 2018-08-12 ENCOUNTER — Ambulatory Visit: Payer: Self-pay

## 2018-09-29 ENCOUNTER — Ambulatory Visit: Payer: Self-pay

## 2018-09-29 ENCOUNTER — Other Ambulatory Visit: Payer: Self-pay

## 2018-09-29 ENCOUNTER — Other Ambulatory Visit: Payer: Self-pay | Admitting: *Deleted

## 2018-09-29 DIAGNOSIS — B20 Human immunodeficiency virus [HIV] disease: Secondary | ICD-10-CM

## 2018-09-29 DIAGNOSIS — Z79899 Other long term (current) drug therapy: Secondary | ICD-10-CM

## 2018-09-29 DIAGNOSIS — Z113 Encounter for screening for infections with a predominantly sexual mode of transmission: Secondary | ICD-10-CM

## 2018-10-10 ENCOUNTER — Other Ambulatory Visit: Payer: Self-pay | Admitting: *Deleted

## 2018-10-20 ENCOUNTER — Ambulatory Visit (INDEPENDENT_AMBULATORY_CARE_PROVIDER_SITE_OTHER): Payer: Self-pay | Admitting: Licensed Clinical Social Worker

## 2018-10-20 ENCOUNTER — Ambulatory Visit (INDEPENDENT_AMBULATORY_CARE_PROVIDER_SITE_OTHER): Payer: Self-pay | Admitting: Family

## 2018-10-20 ENCOUNTER — Encounter: Payer: Self-pay | Admitting: Family

## 2018-10-20 ENCOUNTER — Ambulatory Visit (INDEPENDENT_AMBULATORY_CARE_PROVIDER_SITE_OTHER): Payer: Self-pay | Admitting: Pharmacist

## 2018-10-20 VITALS — BP 155/95 | HR 80 | Temp 98.3°F | Wt 236.0 lb

## 2018-10-20 DIAGNOSIS — B20 Human immunodeficiency virus [HIV] disease: Secondary | ICD-10-CM

## 2018-10-20 DIAGNOSIS — F321 Major depressive disorder, single episode, moderate: Secondary | ICD-10-CM

## 2018-10-20 DIAGNOSIS — F1420 Cocaine dependence, uncomplicated: Secondary | ICD-10-CM

## 2018-10-20 DIAGNOSIS — I10 Essential (primary) hypertension: Secondary | ICD-10-CM

## 2018-10-20 DIAGNOSIS — Z113 Encounter for screening for infections with a predominantly sexual mode of transmission: Secondary | ICD-10-CM

## 2018-10-20 DIAGNOSIS — T7491XA Unspecified adult maltreatment, confirmed, initial encounter: Secondary | ICD-10-CM

## 2018-10-20 MED ORDER — DARUN-COBIC-EMTRICIT-TENOFAF 800-150-200-10 MG PO TABS: 1.0000 | ORAL_TABLET | Freq: Every day | ORAL | 2 refills | Status: DC

## 2018-10-20 MED ORDER — LISINOPRIL 20 MG PO TABS: 20.0000 mg | ORAL_TABLET | Freq: Every day | ORAL | 2 refills | Status: DC
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
## 2018-10-20 NOTE — Assessment & Plan Note (Signed)
Katie Doyle continues to use cocaine which is contributing to her high blood pressure. She currently uses the cocaine to help improve her body pains. We discussed non-pharmacological treatments as well as CBD oil. She remains high risk at present. She will be meeting with our counselor. Continue to monitor.

## 2018-10-20 NOTE — Assessment & Plan Note (Signed)
Blood pressure elevated above goal of 140/90 with current regimen with no headaches or changes in vision currently. Will check CMP for kidney function. She has not been on medication. Will restart her on lisinopril. Encouraged to check blood pressure at home as able or here at the clinic. Continue to monitor.
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
## 2018-10-20 NOTE — Patient Instructions (Signed)
Nice to meet you.  We will get you re-established.   We will check your blood work today and get you started on [place].  Plan to follow up in 1 month or sooner if needed following starting medications.   Follow up with [facility] as planned.

## 2018-10-20 NOTE — Assessment & Plan Note (Signed)
Katie Doyle has been out of care for just about 2 years and has not been on medication for the last year. She has been experiencing a decreased appetite with no other signs/symptoms of opportunistic infection. She sites transportation issues as the reason for falling out of care. We have had her meet with our counselor today and will get her established with [place] for assistance. Unfortunately she does not have any income at present. Application for [place] will be submitted today. Check blood work today. She does have Y181I with several non-polymorphic resistance patterns. Plan to start her on Symtuza pending genotype results. Will need close follow up with plan for next office visit in 1 month or sooner if needed.

## 2018-10-21 LAB — URINE CYTOLOGY ANCILLARY ONLY
CHLAMYDIA, DNA PROBE: NEGATIVE
Neisseria Gonorrhea: NEGATIVE

## 2018-10-21 LAB — T-HELPER CELL (CD4) - (RCID CLINIC ONLY)
CD4 % Helper T Cell: 29 % — ABNORMAL LOW (ref 33–55)
CD4 T Cell Abs: 490 /uL (ref 400–2700)

## 2018-10-23 ENCOUNTER — Institutional Professional Consult (permissible substitution): Payer: Self-pay | Admitting: Licensed Clinical Social Worker

## 2018-10-31 LAB — LIPID PANEL
CHOL/HDL RATIO: 2.4 (calc) (ref <5.0)
Cholesterol: 212 mg/dL — ABNORMAL HIGH (ref <200)
HDL: 90 mg/dL (ref >50)
LDL Cholesterol (Calc): 110 mg/dL (calc) — ABNORMAL HIGH
Non-HDL Cholesterol (Calc): 122 mg/dL (calc) (ref <130)
Triglycerides: 40 mg/dL (ref <150)

## 2018-10-31 LAB — COMPREHENSIVE METABOLIC PANEL
AG Ratio: 1 (calc) (ref 1.0–2.5)
ALBUMIN MSPROF: 4 g/dL (ref 3.6–5.1)
ALKALINE PHOSPHATASE (APISO): 150 U/L — AB (ref 33–130)
ALT: 18 U/L (ref 6–29)
AST: 29 U/L (ref 10–35)
BUN: 11 mg/dL (ref 7–25)
CHLORIDE: 103 mmol/L (ref 98–110)
CO2: 29 mmol/L (ref 20–32)
CREATININE: 0.86 mg/dL (ref 0.50–1.05)
Calcium: 9.5 mg/dL (ref 8.6–10.4)
GLOBULIN: 4 g/dL — AB (ref 1.9–3.7)
Glucose, Bld: 85 mg/dL (ref 65–99)
POTASSIUM: 4 mmol/L (ref 3.5–5.3)
SODIUM: 137 mmol/L (ref 135–146)
TOTAL PROTEIN: 8 g/dL (ref 6.1–8.1)
Total Bilirubin: 0.3 mg/dL (ref 0.2–1.2)

## 2018-10-31 LAB — CBC
HCT: 36.8 % (ref 35.0–45.0)
Hemoglobin: 12.4 g/dL (ref 11.7–15.5)
MCH: 29.2 pg (ref 27.0–33.0)
MCHC: 33.7 g/dL (ref 32.0–36.0)
MCV: 86.8 fL (ref 80.0–100.0)
MPV: 11.3 fL (ref 7.5–12.5)
PLATELETS: 263 10*3/uL (ref 140–400)
RBC: 4.24 10*6/uL (ref 3.80–5.10)
RDW: 13.7 % (ref 11.0–15.0)
WBC: 6.1 10*3/uL (ref 3.8–10.8)

## 2018-10-31 LAB — HIV-1 RNA ULTRAQUANT REFLEX TO GENTYP+
HIV 1 RNA Quant: 9410 copies/mL — ABNORMAL HIGH
HIV-1 RNA Quant, Log: 3.97 Log copies/mL — ABNORMAL HIGH

## 2018-10-31 LAB — HIV-1 GENOTYPE: HIV-1 GENOTYPE: DETECTED — AB

## 2018-10-31 LAB — RPR: RPR: NONREACTIVE

## 2018-11-22 ENCOUNTER — Encounter (HOSPITAL_COMMUNITY): Payer: Self-pay

## 2018-11-22 ENCOUNTER — Emergency Department (HOSPITAL_COMMUNITY)
Admission: EM | Admit: 2018-11-22 | Discharge: 2018-11-22 | Disposition: A | Discharge status: Home / Self Care | Payer: Self-pay | Attending: Emergency Medicine | Admitting: Emergency Medicine

## 2018-11-22 ENCOUNTER — Emergency Department (HOSPITAL_COMMUNITY): Payer: Self-pay

## 2018-11-22 DIAGNOSIS — Z79899 Other long term (current) drug therapy: Secondary | ICD-10-CM | POA: Insufficient documentation

## 2018-11-22 DIAGNOSIS — Z87891 Personal history of nicotine dependence: Secondary | ICD-10-CM | POA: Insufficient documentation

## 2018-11-22 DIAGNOSIS — F1910 Other psychoactive substance abuse, uncomplicated: Secondary | ICD-10-CM | POA: Insufficient documentation

## 2018-11-22 DIAGNOSIS — I10 Essential (primary) hypertension: Secondary | ICD-10-CM | POA: Insufficient documentation

## 2018-11-22 DIAGNOSIS — R4182 Altered mental status, unspecified: Secondary | ICD-10-CM | POA: Insufficient documentation

## 2018-11-22 LAB — URINALYSIS, COMPLETE (UACMP) WITH MICROSCOPIC
BACTERIA UA: NONE SEEN
Bilirubin Urine: NEGATIVE
Glucose, UA: 150 mg/dL — AB
Hgb urine dipstick: NEGATIVE
KETONES UR: NEGATIVE mg/dL
Leukocytes, UA: NEGATIVE
NITRITE: NEGATIVE
PROTEIN: NEGATIVE mg/dL
SPECIFIC GRAVITY, URINE: 1.017 (ref 1.005–1.030)
pH: 6 (ref 5.0–8.0)

## 2018-11-22 LAB — COMPREHENSIVE METABOLIC PANEL
ALT: 13 U/L (ref 0–44)
AST: 19 U/L (ref 15–41)
Albumin: 3.8 g/dL (ref 3.5–5.0)
Alkaline Phosphatase: 124 U/L (ref 38–126)
Anion gap: 9 (ref 5–15)
BUN: 16 mg/dL (ref 6–20)
CHLORIDE: 104 mmol/L (ref 98–111)
CO2: 26 mmol/L (ref 22–32)
Calcium: 8.9 mg/dL (ref 8.9–10.3)
Creatinine, Ser: 1.38 mg/dL — ABNORMAL HIGH (ref 0.44–1.00)
GFR calc non Af Amer: 44 mL/min — ABNORMAL LOW (ref >60)
GFR, EST AFRICAN AMERICAN: 51 mL/min — AB (ref >60)
Glucose, Bld: 248 mg/dL — ABNORMAL HIGH (ref 70–99)
Potassium: 3.6 mmol/L (ref 3.5–5.1)
Sodium: 139 mmol/L (ref 135–145)
Total Bilirubin: 0.3 mg/dL (ref 0.3–1.2)
Total Protein: 8.3 g/dL — ABNORMAL HIGH (ref 6.5–8.1)

## 2018-11-22 LAB — RAPID URINE DRUG SCREEN, HOSP PERFORMED
Amphetamines: NOT DETECTED
Barbiturates: NOT DETECTED
Benzodiazepines: NOT DETECTED
Cocaine: POSITIVE — AB
Opiates: NOT DETECTED
Tetrahydrocannabinol: POSITIVE — AB

## 2018-11-22 LAB — CBC WITH DIFFERENTIAL/PLATELET
Abs Immature Granulocytes: 0.18 10*3/uL — ABNORMAL HIGH (ref 0.00–0.07)
Basophils Absolute: 0.1 10*3/uL (ref 0.0–0.1)
Basophils Relative: 0 %
Eosinophils Absolute: 0.1 10*3/uL (ref 0.0–0.5)
Eosinophils Relative: 0 %
HCT: 42.2 % (ref 36.0–46.0)
Hemoglobin: 13 g/dL (ref 12.0–15.0)
Immature Granulocytes: 1 %
Lymphocytes Relative: 12 %
Lymphs Abs: 2.7 10*3/uL (ref 0.7–4.0)
MCH: 29.1 pg (ref 26.0–34.0)
MCHC: 30.8 g/dL (ref 30.0–36.0)
MCV: 94.4 fL (ref 80.0–100.0)
Monocytes Absolute: 0.8 10*3/uL (ref 0.1–1.0)
Monocytes Relative: 4 %
NEUTROS PCT: 83 %
NRBC: 0 % (ref 0.0–0.2)
Neutro Abs: 18.4 10*3/uL — ABNORMAL HIGH (ref 1.7–7.7)
Platelets: 285 10*3/uL (ref 150–400)
RBC: 4.47 MIL/uL (ref 3.87–5.11)
RDW: 14.8 % (ref 11.5–15.5)
WBC: 22.2 10*3/uL — ABNORMAL HIGH (ref 4.0–10.5)

## 2018-11-22 LAB — CBG MONITORING, ED: GLUCOSE-CAPILLARY: 213 mg/dL — AB (ref 70–99)

## 2018-11-22 LAB — ETHANOL: Alcohol, Ethyl (B): <10 mg/dL (ref <10)

## 2018-11-22 MED ORDER — SODIUM CHLORIDE 0.9 % IV BOLUS
1000.0000 mL | Freq: Once | INTRAVENOUS | Status: AC
  Administered 2018-11-22: 1000 mL via INTRAVENOUS

## 2018-11-22 MED ORDER — ONDANSETRON 4 MG PO TBDP
4.0000 mg | ORAL_TABLET | Freq: Once | ORAL | Status: AC
  Administered 2018-11-22: 4 mg via ORAL
  Filled 2018-11-22: qty 1

## 2018-11-22 MED ORDER — ONDANSETRON HCL 4 MG/2ML IJ SOLN
4.0000 mg | Freq: Once | INTRAMUSCULAR | Status: AC
  Administered 2018-11-22: 4 mg via INTRAVENOUS
  Filled 2018-11-22: qty 2

## 2018-11-22 MED ORDER — NALOXONE HCL 0.4 MG/ML IJ SOLN: 0.4000 mg | Freq: Once | INTRAMUSCULAR | Status: DC

## 2018-11-22 MED ORDER — NALOXONE HCL 0.4 MG/ML IJ SOLN
0.4000 mg | Freq: Once | INTRAMUSCULAR | Status: AC
  Administered 2018-11-22: 0.4 mg via INTRAVENOUS
  Filled 2018-11-22: qty 1

## 2018-11-22 MED ORDER — SODIUM CHLORIDE 0.9 % IV BOLUS
500.0000 mL | Freq: Once | INTRAVENOUS | Status: AC
  Administered 2018-11-22: 500 mL via INTRAVENOUS

## 2018-11-22 MED ORDER — NALOXONE HCL 4 MG/0.1ML NA LIQD: NASAL | 0 refills | Status: DC

## 2018-11-22 MED ORDER — SODIUM CHLORIDE 0.9 % IV SOLN
Freq: Once | INTRAVENOUS | Status: AC
  Administered 2018-11-22: 75 mL/h via INTRAVENOUS
  Administered 2018-11-22: 08:00:00 via INTRAVENOUS

## 2018-11-22 NOTE — ED Notes (Signed)
Pt discharged from ED; instructions provided and scripts given; Pt encouraged to return to ED if symptoms worsen and to f/u with PCP; Pt verbalized understanding of all instructions 
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
## 2018-11-22 NOTE — ED Notes (Signed)
Ambulated with pt to bathroom and back to room, requires SBA with ambulation. Pt shuffles and does not appear to be steady at times.

## 2018-11-22 NOTE — ED Provider Notes (Signed)
53 year old female brought in because of altered mental status.  Apparently, she was with her grandchildren who noted that she was not responding appropriately.  She does have history of cocaine abuse.  On exam, she is noted to have slow respirations and pinpoint pupils.  She will be given intravenous naloxone.   Delora Fuel, MD 20/72/18 [phone]

## 2018-11-22 NOTE — ED Notes (Signed)
Bair hugger removed, pt temp 98.15F (oral)

## 2018-11-22 NOTE — ED Triage Notes (Signed)
Pt arrived via [facility]; pt from dtrs home with c/o being lethargic and unresponsive per family; EMS states patient was diaphoretic and warm; hx of crack use, pinpoint pupils; 156/102, CBG 274, 10R

## 2018-11-22 NOTE — Discharge Instructions (Addendum)
Avoid illicit substances.   Please follow up with your behavioral health specialist in regards to your drug use.   Follow up with your primary physician later this week for repeat laboratory work.  Return to the emergency room for any new or worsening symptoms.

## 2018-11-22 NOTE — ED Notes (Signed)
Pt up ambulating with minimal assistance and stating that she is ok but feels weak. Pt ambulated independently with standby through hallway; asked for something to drink and patient given crackers

## 2018-12-08 ENCOUNTER — Ambulatory Visit: Payer: Self-pay | Admitting: Family

## 2018-12-08 NOTE — Progress Notes (Deleted)
Subjective:    Patient ID: Katie Doyle, female    DOB: Sep 19, 1966, 52 y.o.   MRN: [id number]  No chief complaint on file.    HPI:  Katie Doyle is a 52 y.o. female who presents today for routine follow up of HIV disease.  Katie Doyle was last seen in the office on 10/20/18 for routine follow up after having been out of care for over 1 year with an antiretroviral regimen of Genvoya. At that time she had a viral load of 9,410 with a CD4 count of 490. Previous Genotype from 2016 with V108I, V179D, Y181I, L210W, and T21D with resistance to efavirenz, etravirine, and delaviridine. Updated Genoytype from 10/20/18 with V106I, V179D, Y181I, and L210W and T215D. Her regimen was changed to [employer]. She was seen in the ED on 11/22/18 with altered mental status with positive drug screen for cocaine and marijuana. Health maintenance due include influenza vaccination, Prevnar, Menveo, and dental screen. Other screenings include mammogram and colonoscopy. Her UMAP and [place] has been approved until 03/17/19.   Allergies  Allergen Reactions  . Penicillins Itching and Rash    Pt tolerated  Zosyn during 05/22/13 admission, Has patient had a PCN reaction causing immediate rash, facial/tongue/throat swelling, SOB or lightheadedness with hypotension: Yes Has patient had a PCN reaction causing severe rash involving mucus membranes or skin necrosis: No Has patient had a PCN reaction that required hospitalization Yes Has patient had a PCN reaction occurring within the last 10 years: No If all of the above answers are "NO", then may proceed with Cephalosporin use.       Outpatient Medications Prior to Visit  Medication Sig Dispense Refill  . Darunavir-Cobicisctat-Emtricitabine-Tenofovir Alafenamide (SYMTUZA) 800-150-200-10 MG TABS Take 1 tablet by mouth daily with breakfast. 30 tablet 2  . lisinopril (PRINIVIL,ZESTRIL) 20 MG tablet Take 1 tablet (20 mg total) by mouth daily. 30 tablet 2  . naloxone (NARCAN)  nasal spray 4 mg/0.1 mL Use in event of opiate/narcotic overdose, and seek medical attention right away 1 kit 0   No facility-administered medications prior to visit.      Past Medical History:  Diagnosis Date  . Allergy   . Anemia   . Anxiety   . Asthma    rarely uses inhaler  . Chronic pain in left shoulder 2013   post shoulder reduction  . CVA (cerebral infarction)    ? pt unsure of date  . Depression   . Fibroids   . HIV (human immunodeficiency virus infection) ([place]) 09-2014  . Hypertension   . Schizophrenia ([place])   . SVD (spontaneous vaginal delivery) 1984   x 1     Past Surgical History:  Procedure Laterality Date  . ABDOMINAL HYSTERECTOMY N/A 05/14/2013   Procedure: TOTAL ABDOMINAL HYSTERECTOMY;  Surgeon: Woodroe Mode, MD;  Location: [facility];  Service: Gynecology;  Laterality: N/A;  . APPENDECTOMY    . [place]   x 1  . CYSTO N/A 05/14/2013   Procedure: CYSTO;  Surgeon: Woodroe Mode, MD;  Location: [facility];  Service: Gynecology;  Laterality: N/A;  . INCISION AND DRAINAGE ABSCESS N/A 05/23/2013   Procedure: INCISION AND DRAINAGE OF ABDOMINAL ABSCESS;  Surgeon: Woodroe Mode, MD;  Location: [facility];  Service: Gynecology;  Laterality: N/A;  . SALPINGOOPHORECTOMY Bilateral 05/14/2013   Procedure: SALPINGO OOPHORECTOMY;  Surgeon: Woodroe Mode, MD;  Location: [facility];  Service: Gynecology;  Laterality: Bilateral;  . WISDOM TOOTH EXTRACTION  Review of Systems  Constitutional: Negative for appetite change, chills, diaphoresis, fatigue, fever and unexpected weight change.  Eyes:       Negative for acute change in vision  Respiratory: Negative for chest tightness, shortness of breath and wheezing.   Cardiovascular: Negative for chest pain.  Gastrointestinal: Negative for diarrhea, nausea and vomiting.  Genitourinary: Negative for dysuria, pelvic pain and vaginal discharge.  Musculoskeletal: Negative for neck pain and neck stiffness.  Skin:  Negative for rash.  Neurological: Negative for seizures, syncope, weakness and headaches.  Hematological: Negative for adenopathy. Does not bruise/bleed easily.  Psychiatric/Behavioral: Negative for hallucinations.      Objective:    LMP 01/19/2013  Nursing note and vital signs reviewed.  Physical Exam Constitutional:      General: She is not in acute distress.    Appearance: She is well-developed.  Eyes:     Conjunctiva/sclera: Conjunctivae normal.  Neck:     Musculoskeletal: Neck supple.  Cardiovascular:     Rate and Rhythm: Normal rate and regular rhythm.     Heart sounds: Normal heart sounds. No murmur. No friction rub. No gallop.   Pulmonary:     Effort: Pulmonary effort is normal. No respiratory distress.     Breath sounds: Normal breath sounds. No wheezing or rales.  Chest:     Chest wall: No tenderness.  Abdominal:     General: Bowel sounds are normal.     Palpations: Abdomen is soft.     Tenderness: There is no abdominal tenderness.  Lymphadenopathy:     Cervical: No cervical adenopathy.  Skin:    General: Skin is warm and dry.     Findings: No rash.  Neurological:     Mental Status: She is alert and oriented to person, place, and time.  Psychiatric:        Behavior: Behavior normal.        Thought Content: Thought content normal.        Judgment: Judgment normal.        Assessment & Plan:   Problem List Items Addressed This Visit    None       I am having Katie Doyle maintain her Darunavir-Cobicisctat-Emtricitabine-Tenofovir Alafenamide, lisinopril, and naloxone.   No orders of the defined types were placed in this encounter.    Follow-up: No follow-ups on file.   Terri Piedra, MSN, FNP-C Nurse Practitioner [facility] [place] [facility] Office phone: [phone] Pager: [place] number: [phone]

## 2018-12-24 IMAGING — CT CT CERVICAL SPINE W/O CM
3 of 4 series · 12 of 33 positions shown, 14 images · non-contrast
Comparison: 05/26/2015

CLINICAL DATA: Altered mental status.  Diaphoretic in warm.

EXAM:
CT HEAD WITHOUT CONTRAST
CT CERVICAL SPINE WITHOUT CONTRAST
TECHNIQUE: Multidetector CT imaging of the head and cervical spine was
performed following the standard protocol without intravenous
contrast. Multiplanar CT image reconstructions of the cervical spine
were also generated.

[Series 4: c_spine 2.0 st · axial · 0.29mm/px · z∈[-211,-93]mm · 4 of 89 slices shown, 5 images]
[im 15/89  soft-tissue]
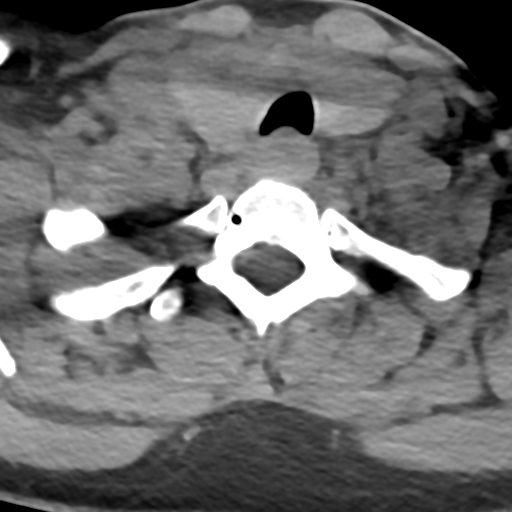
[im 15/89  bone]
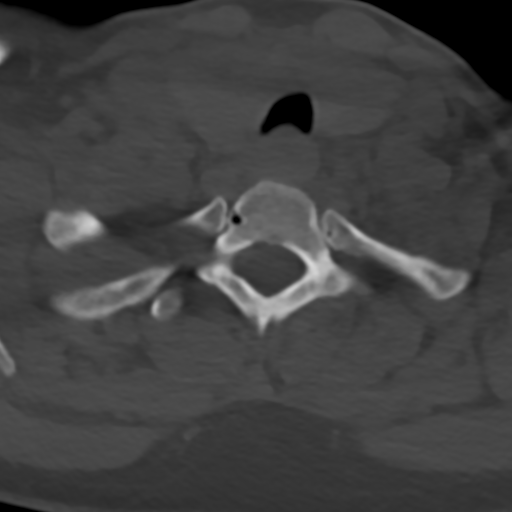
[im 30/89  bone]
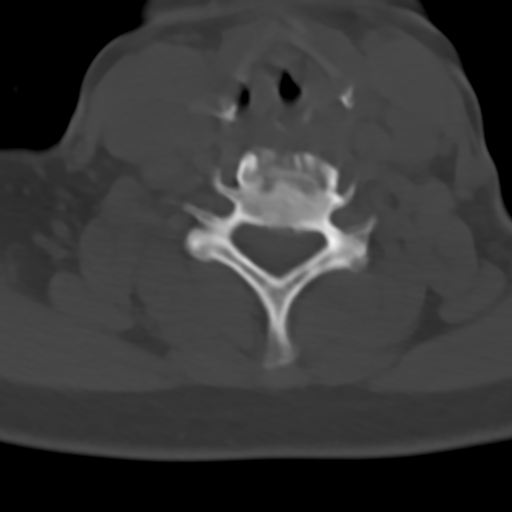
[im 59/89  bone]
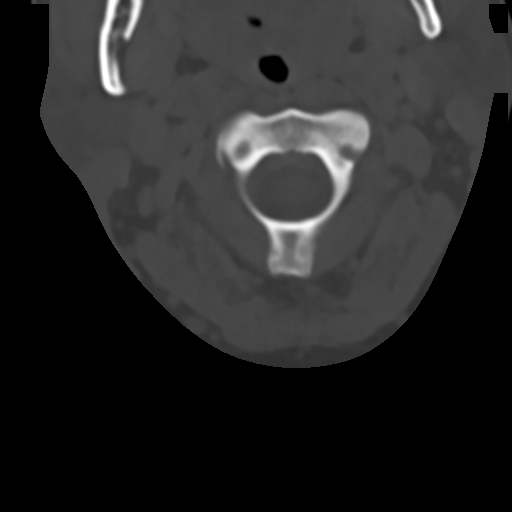
[im 74/89  bone]
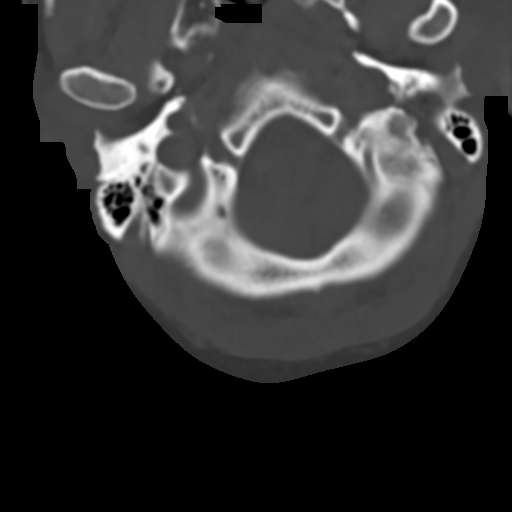

[Series 6: c_spine 2.0 sag bone · sagittal · 0.23mm/px · 5 of 61 slices shown, 6 images]
[im 21/61  bone]
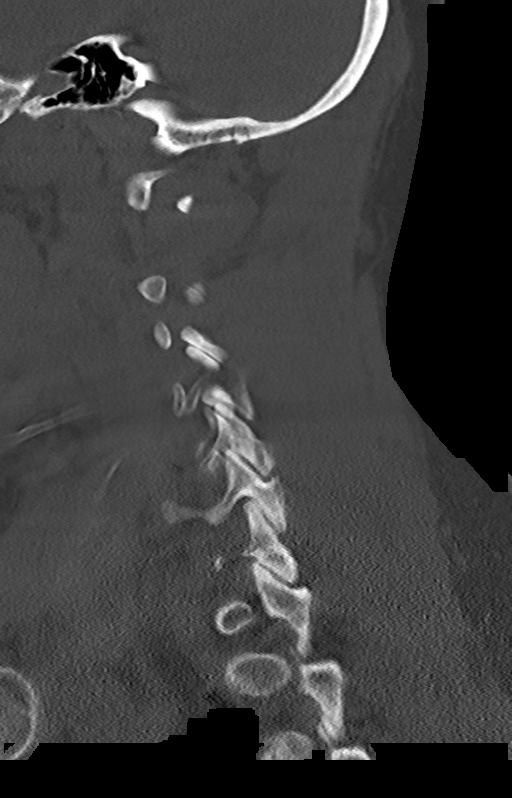
[im 26/61  bone]
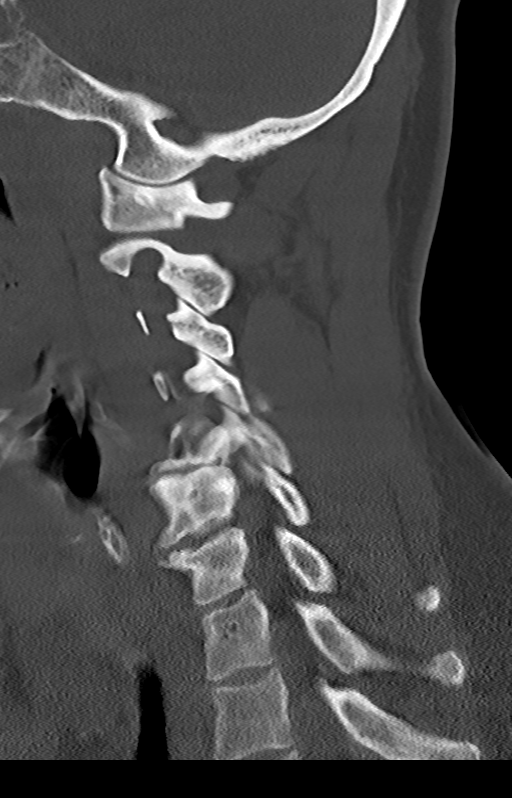
[im 31/61  soft-tissue]
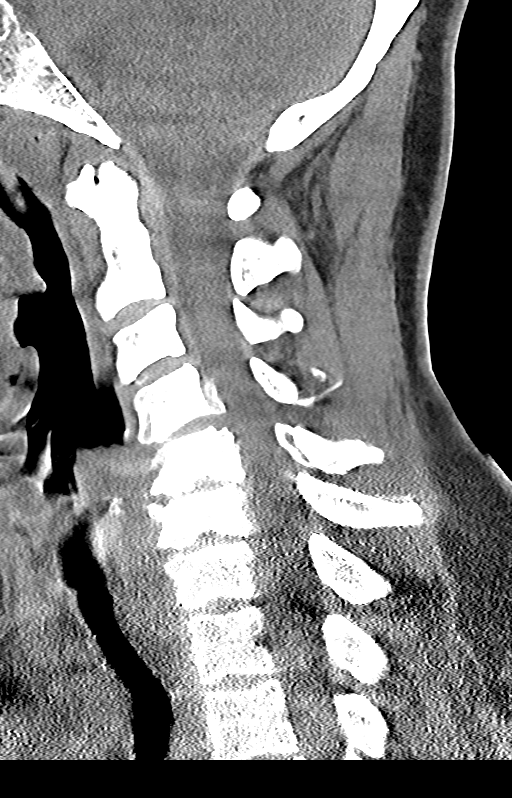
[im 31/61  bone]
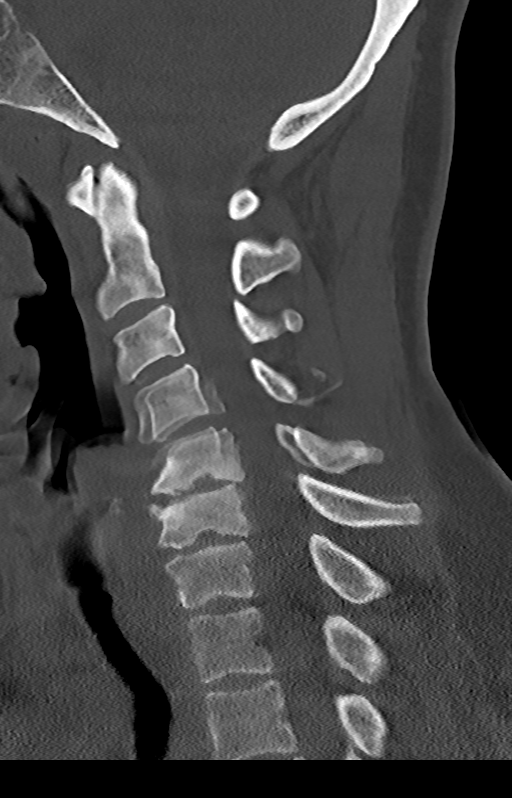
[im 36/61  bone]
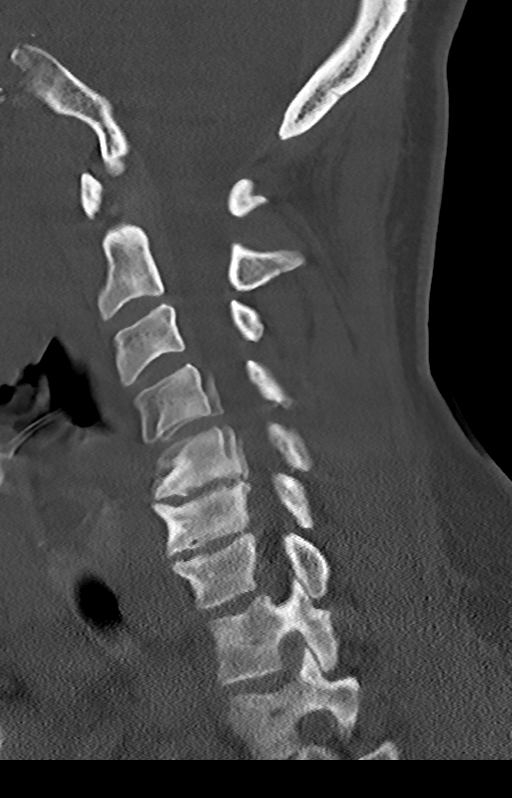
[im 41/61  bone]
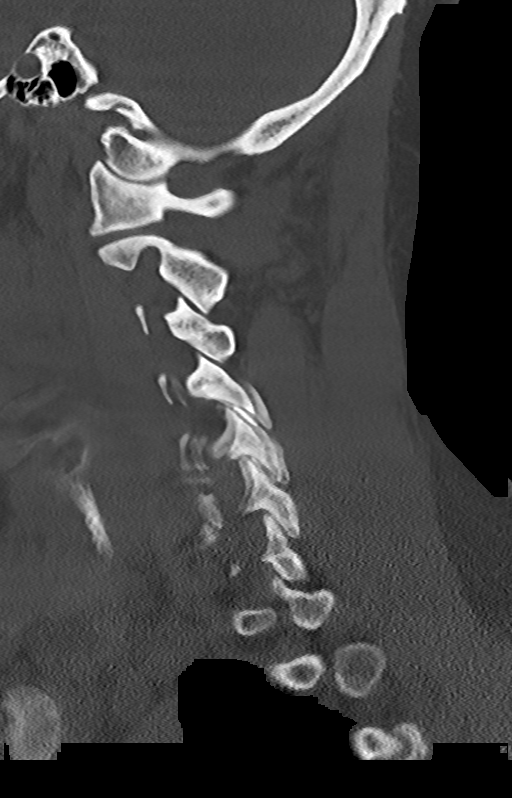

[Series 7: c_spine 2.0 cor bone · coronal · 0.23mm/px · 3 of 61 slices shown]
[im 13/61  bone]
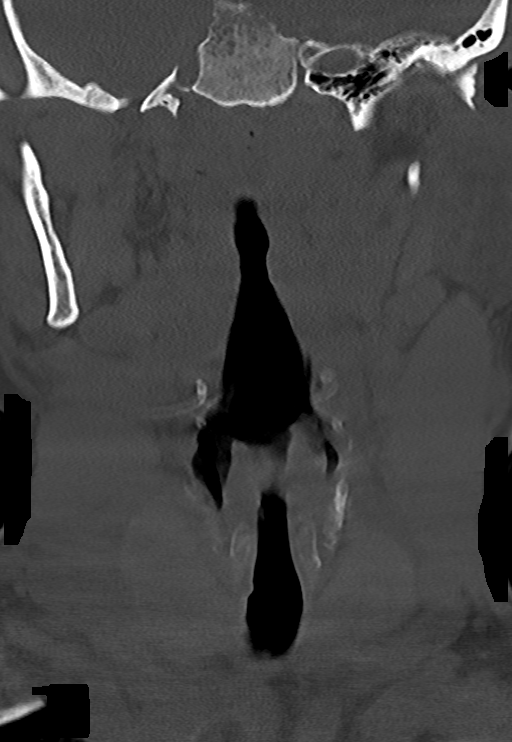
[im 25/61  bone]
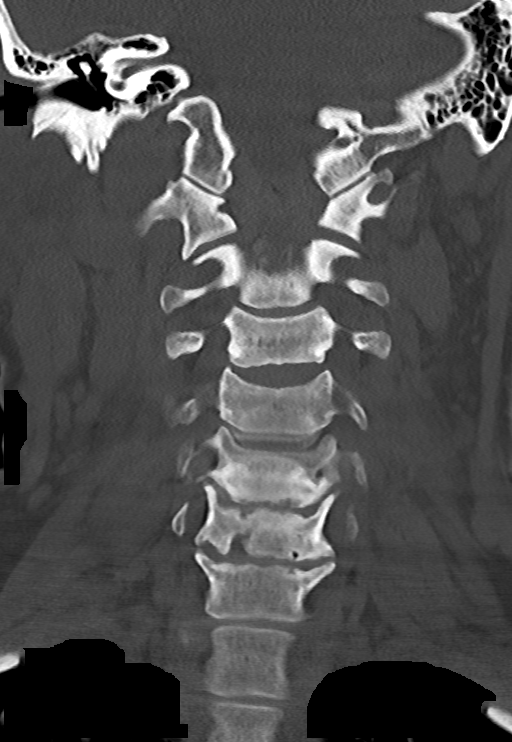
[im 37/61  bone]
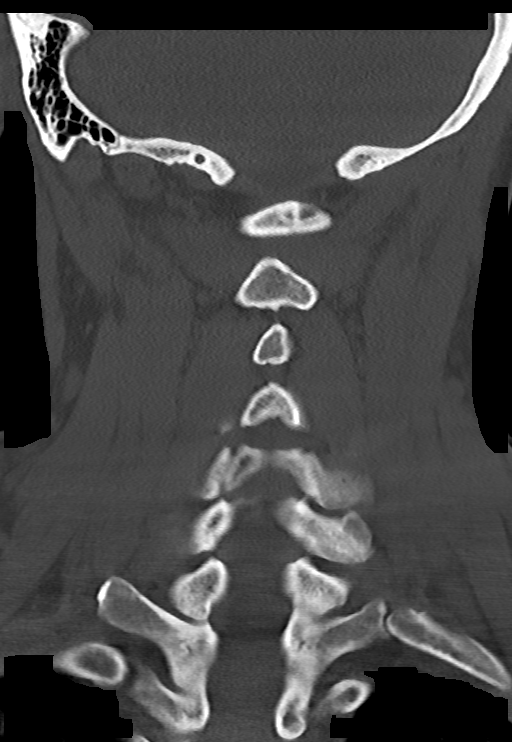

[12 of 33 positions shown; findings below may reference images not displayed]

FINDINGS: CT HEAD FINDINGS

Brain: No evidence of acute infarction, hemorrhage, hydrocephalus,
extra-axial collection or mass lesion/mass effect.

Vascular: No hyperdense vessel or unexpected calcification.

Skull: No osseous abnormality.

Sinuses/Orbits: Visualized paranasal sinuses are clear. Visualized
mastoid sinuses are clear. Visualized orbits demonstrate no focal
abnormality.

Other: None

CT CERVICAL SPINE FINDINGS

Patient motion during the examination limits evaluation particularly
at C4, C5 and C6.

Alignment: Normal.

Skull base and vertebrae: No acute fracture. No primary bone lesion
or focal pathologic process.

Soft tissues and spinal canal: No prevertebral fluid or swelling. No
visible canal hematoma.

Disc levels: Degenerative disc disease with disc height loss at C5-6
and C6-7. bilateral uncovertebral degenerative changes at C5-6.

Upper chest: Lung apices are clear.

Other: No fluid collection or hematoma.
IMPRESSION: Patient motion during the examination limits evaluation particularly
at C4, C5 and C6.

1. No acute intracranial pathology.
2. No acute osseous injury the cervical spine with limitations as
above. If there is persistent clinical concern regarding cervical
injury, recommend repeat CT of the cervical spine.

## 2019-01-16 ENCOUNTER — Ambulatory Visit: Payer: Self-pay | Admitting: Family

## 2019-02-02 ENCOUNTER — Other Ambulatory Visit: Payer: Self-pay

## 2019-02-02 DIAGNOSIS — Z113 Encounter for screening for infections with a predominantly sexual mode of transmission: Secondary | ICD-10-CM

## 2019-02-02 DIAGNOSIS — B20 Human immunodeficiency virus [HIV] disease: Secondary | ICD-10-CM

## 2019-02-03 ENCOUNTER — Other Ambulatory Visit: Payer: Self-pay

## 2019-02-17 ENCOUNTER — Other Ambulatory Visit: Payer: Self-pay

## 2019-02-19 ENCOUNTER — Other Ambulatory Visit: Payer: Self-pay

## 2019-03-02 ENCOUNTER — Encounter: Payer: Self-pay | Admitting: Family

## 2019-03-16 ENCOUNTER — Other Ambulatory Visit: Payer: Self-pay | Admitting: Family

## 2019-03-16 DIAGNOSIS — B20 Human immunodeficiency virus [HIV] disease: Secondary | ICD-10-CM

## 2019-07-28 ENCOUNTER — Ambulatory Visit: Payer: Self-pay | Admitting: Pharmacist

## 2019-07-30 ENCOUNTER — Ambulatory Visit: Payer: Self-pay | Admitting: Pharmacist

## 2019-11-25 ENCOUNTER — Encounter: Payer: Self-pay | Admitting: Family

## 2019-11-25 NOTE — Progress Notes (Signed)
Patient ID: Katie Doyle, female   DOB: July 08, 1966, 53 y.o.   MRN: [id number] Called patient from Viral Load List  Someone answered and advised she was not there

## 2019-12-09 ENCOUNTER — Other Ambulatory Visit: Payer: Self-pay

## 2019-12-09 DIAGNOSIS — Z79899 Other long term (current) drug therapy: Secondary | ICD-10-CM

## 2019-12-09 DIAGNOSIS — Z113 Encounter for screening for infections with a predominantly sexual mode of transmission: Secondary | ICD-10-CM

## 2019-12-09 DIAGNOSIS — B20 Human immunodeficiency virus [HIV] disease: Secondary | ICD-10-CM

## 2019-12-23 ENCOUNTER — Other Ambulatory Visit: Payer: Self-pay

## 2019-12-23 ENCOUNTER — Ambulatory Visit: Payer: Self-pay

## 2019-12-23 ENCOUNTER — Encounter: Payer: Self-pay | Admitting: Family

## 2019-12-23 DIAGNOSIS — B20 Human immunodeficiency virus [HIV] disease: Secondary | ICD-10-CM

## 2019-12-23 DIAGNOSIS — Z113 Encounter for screening for infections with a predominantly sexual mode of transmission: Secondary | ICD-10-CM

## 2019-12-23 DIAGNOSIS — Z79899 Other long term (current) drug therapy: Secondary | ICD-10-CM

## 2019-12-24 LAB — T-HELPER CELL (CD4) - (RCID CLINIC ONLY)
CD4 % Helper T Cell: 23 % — ABNORMAL LOW (ref 33–65)
CD4 T Cell Abs: 403 /uL (ref 400–1790)

## 2019-12-25 ENCOUNTER — Encounter: Payer: Self-pay | Admitting: Family

## 2019-12-25 LAB — URINE CYTOLOGY ANCILLARY ONLY
Chlamydia: NEGATIVE
Comment: NEGATIVE
Comment: NORMAL
Neisseria Gonorrhea: NEGATIVE

## 2019-12-29 LAB — COMPLETE METABOLIC PANEL WITH GFR
AG Ratio: 1.2 (calc) (ref 1.0–2.5)
ALT: 14 U/L (ref 6–29)
AST: 15 U/L (ref 10–35)
Albumin: 3.7 g/dL (ref 3.6–5.1)
Alkaline phosphatase (APISO): 124 U/L (ref 37–153)
BUN: 11 mg/dL (ref 7–25)
CO2: 28 mmol/L (ref 20–32)
Calcium: 9.1 mg/dL (ref 8.6–10.4)
Chloride: 105 mmol/L (ref 98–110)
Creat: 0.78 mg/dL (ref 0.50–1.05)
GFR, Est African American: 101 mL/min/{1.73_m2} (ref >=60)
GFR, Est Non African American: 87 mL/min/{1.73_m2} (ref >=60)
Globulin: 3.1 g/dL (calc) (ref 1.9–3.7)
Glucose, Bld: 85 mg/dL (ref 65–99)
Potassium: 4.6 mmol/L (ref 3.5–5.3)
Sodium: 138 mmol/L (ref 135–146)
Total Bilirubin: 0.3 mg/dL (ref 0.2–1.2)
Total Protein: 6.8 g/dL (ref 6.1–8.1)

## 2019-12-29 LAB — CBC WITH DIFFERENTIAL/PLATELET
Absolute Monocytes: 473 cells/uL (ref 200–950)
Basophils Absolute: 19 cells/uL (ref 0–200)
Basophils Relative: 0.3 %
Eosinophils Absolute: 38 cells/uL (ref 15–500)
Eosinophils Relative: 0.6 %
HCT: 35.4 % (ref 35.0–45.0)
Hemoglobin: 11.9 g/dL (ref 11.7–15.5)
Lymphs Abs: 1840 cells/uL (ref 850–3900)
MCH: 30.5 pg (ref 27.0–33.0)
MCHC: 33.6 g/dL (ref 32.0–36.0)
MCV: 90.8 fL (ref 80.0–100.0)
MPV: 11.4 fL (ref 7.5–12.5)
Monocytes Relative: 7.5 %
Neutro Abs: 3931 cells/uL (ref 1500–7800)
Neutrophils Relative %: 62.4 %
Platelets: 278 10*3/uL (ref 140–400)
RBC: 3.9 10*6/uL (ref 3.80–5.10)
RDW: 14.2 % (ref 11.0–15.0)
Total Lymphocyte: 29.2 %
WBC: 6.3 10*3/uL (ref 3.8–10.8)

## 2019-12-29 LAB — LIPID PANEL
Cholesterol: 216 mg/dL — ABNORMAL HIGH (ref <200)
HDL: 77 mg/dL (ref >=50)
LDL Cholesterol (Calc): 125 mg/dL (calc) — ABNORMAL HIGH
Non-HDL Cholesterol (Calc): 139 mg/dL (calc) — ABNORMAL HIGH (ref <130)
Total CHOL/HDL Ratio: 2.8 (calc) (ref <5.0)
Triglycerides: 45 mg/dL (ref <150)

## 2019-12-29 LAB — RPR: RPR Ser Ql: REACTIVE — AB

## 2019-12-29 LAB — FLUORESCENT TREPONEMAL AB(FTA)-IGG-BLD: Fluorescent Treponemal ABS: REACTIVE — AB

## 2019-12-29 LAB — HIV-1 RNA QUANT-NO REFLEX-BLD
HIV 1 RNA Quant: 11100 copies/mL — ABNORMAL HIGH
HIV-1 RNA Quant, Log: 4.05 Log copies/mL — ABNORMAL HIGH

## 2019-12-29 LAB — RPR TITER: RPR Titer: 1:1 {titer} — ABNORMAL HIGH

## 2020-01-11 ENCOUNTER — Encounter: Payer: Self-pay | Admitting: Family

## 2020-01-19 ENCOUNTER — Other Ambulatory Visit: Payer: Self-pay

## 2020-01-19 ENCOUNTER — Ambulatory Visit (INDEPENDENT_AMBULATORY_CARE_PROVIDER_SITE_OTHER): Payer: Self-pay | Admitting: Family

## 2020-01-19 ENCOUNTER — Encounter: Payer: Self-pay | Admitting: Family

## 2020-01-19 VITALS — BP 165/106 | HR 73 | Ht 65.0 in | Wt 223.0 lb

## 2020-01-19 DIAGNOSIS — B20 Human immunodeficiency virus [HIV] disease: Secondary | ICD-10-CM

## 2020-01-19 DIAGNOSIS — F1420 Cocaine dependence, uncomplicated: Secondary | ICD-10-CM

## 2020-01-19 DIAGNOSIS — Z0000 Encounter for general adult medical examination without abnormal findings: Secondary | ICD-10-CM

## 2020-01-19 DIAGNOSIS — Z23 Encounter for immunization: Secondary | ICD-10-CM

## 2020-01-19 MED ORDER — SYMTUZA 800-150-200-10 MG PO TABS: 1.0000 | ORAL_TABLET | Freq: Every day | ORAL | 2 refills | Status: DC

## 2020-01-19 NOTE — Patient Instructions (Signed)
Nice to see you.  Please restart taking your Symtuza today.  Schedule an appointment with Marcie Bal for counseling and Janene Madeira, NP for a Pap smear.   I will work on getting a mammogram and colonoscopy scheduled.  We will plan to see you back in 1 month or sooner if needed with lab work on the same day.   Have a great day and stay safe!
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
## 2020-01-19 NOTE — Assessment & Plan Note (Signed)
Katie Doyle continues to use cocaine. Discussed importance of seeking further care including counseling and possible attendance at [facility]. This is a significant contributing factor along with alcohol as to her continued adherence to her ART regimen. Will continue to counsel.

## 2020-01-19 NOTE — Assessment & Plan Note (Signed)
Katie Doyle has poorly controlled HIV disease due to less than optimal adherence to her ART regimen of Symtuza. Fortunately she has no signs/symptoms of opportunistic infection or progressive HIV disease. We reviewed her lab work and discussed the importance of taking medication to prevent progression. Recommended seeking counseling as well as using available resources for transportation. Restart Symtuza. Plan for follow up in 1 month or sooner if needed with lab work on the same day.

## 2020-01-19 NOTE — Assessment & Plan Note (Signed)
   Prevnar updated today  Due for colon cancer, breast cancer and cervical cancer screening. Will have appointment schedule with Pap clinic  Discussed importance of safe sexual practice to reduce risk for STI. Declines condoms.

## 2020-01-25 ENCOUNTER — Telehealth: Payer: Self-pay | Admitting: *Deleted

## 2020-01-25 NOTE — Telephone Encounter (Signed)
Hard to engage with the patient because 2 family members in the home are positive and they think that the other is unaware of their status. The home phone is the only number listed for the patient. One family member and I are already connected so he would know the reasons for me contacting Katie Doyle. Thankfully Katie Doyle came on 2/2 with a f/u scheduled for 3/2

## 2020-02-16 ENCOUNTER — Ambulatory Visit: Payer: Self-pay | Admitting: Family

## 2020-02-18 ENCOUNTER — Ambulatory Visit: Payer: Self-pay | Admitting: Family

## 2020-03-14 ENCOUNTER — Encounter: Payer: Self-pay | Admitting: Family

## 2020-03-14 ENCOUNTER — Other Ambulatory Visit: Payer: Self-pay

## 2020-03-14 ENCOUNTER — Ambulatory Visit (INDEPENDENT_AMBULATORY_CARE_PROVIDER_SITE_OTHER): Payer: Self-pay | Admitting: Family

## 2020-03-14 VITALS — BP 163/96 | HR 67 | Temp 98.0°F | Ht 65.5 in | Wt 246.0 lb

## 2020-03-14 DIAGNOSIS — I10 Essential (primary) hypertension: Secondary | ICD-10-CM

## 2020-03-14 DIAGNOSIS — F1420 Cocaine dependence, uncomplicated: Secondary | ICD-10-CM

## 2020-03-14 DIAGNOSIS — Z0000 Encounter for general adult medical examination without abnormal findings: Secondary | ICD-10-CM

## 2020-03-14 DIAGNOSIS — B20 Human immunodeficiency virus [HIV] disease: Secondary | ICD-10-CM

## 2020-03-14 DIAGNOSIS — F1911 Other psychoactive substance abuse, in remission: Secondary | ICD-10-CM

## 2020-03-14 DIAGNOSIS — Z113 Encounter for screening for infections with a predominantly sexual mode of transmission: Secondary | ICD-10-CM | POA: Insufficient documentation

## 2020-03-14 MED ORDER — LISINOPRIL 20 MG PO TABS: 20.0000 mg | ORAL_TABLET | Freq: Every day | ORAL | 2 refills | Status: DC

## 2020-03-14 MED ORDER — SYMTUZA 800-150-200-10 MG PO TABS: 1.0000 | ORAL_TABLET | Freq: Every day | ORAL | 2 refills | Status: DC

## 2020-03-14 NOTE — Assessment & Plan Note (Signed)
Last RPR titer on 12/23/19 was Reactive 1:1. Today will recheck RPR titer again.

## 2020-03-14 NOTE — Assessment & Plan Note (Signed)
Patient is currently working on cocaine cessation. Last use 7-8 days ago. Applauded patient's efforts towards sobriety and encourage her to continue on her journey. Informed patient about the clinic's resources such as counseling if she needs it.

## 2020-03-14 NOTE — Assessment & Plan Note (Signed)
Patient currently smokes marijuana 1-2 times daily and drinks 1-2 glasses of wine per day. She is currently working on stopping cocaine use, last use 7-8 days ago.

## 2020-03-14 NOTE — Assessment & Plan Note (Signed)
Today in clinic patient's BP is 163/96, she reported that she has not been taking her BP meds due to not having any refills. Will send Lisinopril 20mg  tablet daily prescription to the pharmacy. Have patient start taking and maintain Lisinopril 20mg  tablet daily. Monitor BPs at home.

## 2020-03-14 NOTE — Assessment & Plan Note (Addendum)
Ms. Katie Doyle had trouble with taking her ART regimen in the past. Most recent lab work on 12/23/19 showed HIV viral load of 11,100 and CD4 count of 403.  Patient reports taking Symtuza with good adherence recently. Today will check HIV Viral load, CD4 count and CMP with GFR.  Continue taking Symtuza one tablet daily. Return to clinic in 6 weeks.
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
## 2020-03-14 NOTE — Patient Instructions (Addendum)
Nice to see you.  We will check your blood work today.  Continue to take your Symtuza daily.  Plan for follow up with dentistry as scheduled.  [facility] - [phone]  [place] [email or web address]                                                                                                       Refills of medication have been sent to the pharmacy.  Have a great day and stay safe!

## 2020-03-14 NOTE — Assessment & Plan Note (Addendum)
Patient is not due for any immunizations today. Patient reports being apprehensive about the COVID vaccine due to the side effects. Educated her on expected immune response to vaccines and how to manage side effects if they occur. Will give information about COVID vaccine sites in the AVS.   Declined STI testing today. Condoms provided and encourage safe sex practices.   She has an appointment with [facility] clinic on 03/30/20.

## 2020-03-15 LAB — T-HELPER CELL (CD4) - (RCID CLINIC ONLY)
CD4 % Helper T Cell: 27 % — ABNORMAL LOW (ref 33–65)
CD4 T Cell Abs: 406 /uL (ref 400–1790)

## 2020-03-17 LAB — HIV-1 RNA QUANT-NO REFLEX-BLD
HIV 1 RNA Quant: 75 copies/mL — ABNORMAL HIGH
HIV-1 RNA Quant, Log: 1.88 Log copies/mL — ABNORMAL HIGH

## 2020-03-17 LAB — COMPLETE METABOLIC PANEL WITH GFR
AG Ratio: 1 (calc) (ref 1.0–2.5)
ALT: 10 U/L (ref 6–29)
AST: 12 U/L (ref 10–35)
Albumin: 3.7 g/dL (ref 3.6–5.1)
Alkaline phosphatase (APISO): 121 U/L (ref 37–153)
BUN: 18 mg/dL (ref 7–25)
CO2: 28 mmol/L (ref 20–32)
Calcium: 9.2 mg/dL (ref 8.6–10.4)
Chloride: 104 mmol/L (ref 98–110)
Creat: 0.84 mg/dL (ref 0.50–1.05)
GFR, Est African American: 93 mL/min/{1.73_m2} (ref >=60)
GFR, Est Non African American: 80 mL/min/{1.73_m2} (ref >=60)
Globulin: 3.7 g/dL (calc) (ref 1.9–3.7)
Glucose, Bld: 94 mg/dL (ref 65–99)
Potassium: 4.4 mmol/L (ref 3.5–5.3)
Sodium: 137 mmol/L (ref 135–146)
Total Bilirubin: 0.3 mg/dL (ref 0.2–1.2)
Total Protein: 7.4 g/dL (ref 6.1–8.1)

## 2020-03-17 LAB — FLUORESCENT TREPONEMAL AB(FTA)-IGG-BLD: Fluorescent Treponemal ABS: REACTIVE — AB

## 2020-03-17 LAB — RPR TITER: RPR Titer: 1:1 {titer} — ABNORMAL HIGH

## 2020-03-17 LAB — RPR: RPR Ser Ql: REACTIVE — AB

## 2020-05-02 ENCOUNTER — Ambulatory Visit: Payer: Self-pay | Admitting: Family

## 2021-05-29 ENCOUNTER — Other Ambulatory Visit: Payer: Self-pay | Admitting: Infectious Diseases

## 2021-05-29 DIAGNOSIS — B20 Human immunodeficiency virus [HIV] disease: Secondary | ICD-10-CM

## 2022-01-16 ENCOUNTER — Telehealth: Payer: Self-pay

## 2022-01-16 NOTE — Telephone Encounter (Signed)
Patient last seen 02/2020 - called to offer appointment. Someone else answered and stated that Katie Doyle was not available at the moment. Asked that he have her call her doctor's office when she gets back. Did not disclose any private or health information.   Beryle Flock, RN

## 2022-04-02 ENCOUNTER — Telehealth: Payer: Self-pay

## 2022-04-02 NOTE — Telephone Encounter (Signed)
Patient out of care, last seen 02/2020. She accepts appointment with pharmacy clinic tomorrow 4/18 to reengage in care.  ? ?Beryle Flock, RN ? ?

## 2022-04-03 ENCOUNTER — Ambulatory Visit: Payer: Self-pay | Admitting: Pharmacist

## 2022-06-18 ENCOUNTER — Ambulatory Visit: Payer: Self-pay | Admitting: Family

## 2022-06-20 ENCOUNTER — Encounter: Payer: Self-pay | Admitting: Family

## 2022-06-21 ENCOUNTER — Ambulatory Visit: Payer: Self-pay | Admitting: Family

## 2022-07-05 ENCOUNTER — Telehealth: Payer: Self-pay

## 2022-07-05 NOTE — Telephone Encounter (Signed)
Received fax from [facility] stating patient has been less than 80% adherent to medication due to Insurance. Per chart review patient approved for [facility]W and [employer] earlier in July.  Patient is scheduled to see [place] on 7/28 has not had labs done since 2021. Tried to call patient to schedule lab appointment with office. Not able to reach her at this time. Spoke with patient's mother who will have her call office later today.  Did not disclose any personal information to patient's mother. Leatrice Jewels, RMA

## 2022-07-09 ENCOUNTER — Ambulatory Visit: Payer: Self-pay | Admitting: Family

## 2022-07-11 ENCOUNTER — Encounter: Payer: Self-pay | Admitting: Family

## 2022-07-11 ENCOUNTER — Other Ambulatory Visit: Payer: Self-pay

## 2022-07-11 ENCOUNTER — Ambulatory Visit (INDEPENDENT_AMBULATORY_CARE_PROVIDER_SITE_OTHER): Payer: Self-pay | Admitting: Family

## 2022-07-11 VITALS — BP 164/112 | HR 75 | Temp 97.9°F | Wt 212.0 lb

## 2022-07-11 DIAGNOSIS — I10 Essential (primary) hypertension: Secondary | ICD-10-CM

## 2022-07-11 DIAGNOSIS — Z113 Encounter for screening for infections with a predominantly sexual mode of transmission: Secondary | ICD-10-CM

## 2022-07-11 DIAGNOSIS — B20 Human immunodeficiency virus [HIV] disease: Secondary | ICD-10-CM

## 2022-07-11 DIAGNOSIS — F1990 Other psychoactive substance use, unspecified, uncomplicated: Secondary | ICD-10-CM

## 2022-07-11 DIAGNOSIS — Z0000 Encounter for general adult medical examination without abnormal findings: Secondary | ICD-10-CM

## 2022-07-11 DIAGNOSIS — Z79899 Other long term (current) drug therapy: Secondary | ICD-10-CM

## 2022-07-11 MED ORDER — SYMTUZA 800-150-200-10 MG PO TABS: 1.0000 | ORAL_TABLET | Freq: Every day | ORAL | 2 refills | Status: DC

## 2022-07-11 MED ORDER — LISINOPRIL 20 MG PO TABS: 20.0000 mg | ORAL_TABLET | Freq: Every day | ORAL | 2 refills | Status: DC
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
## 2022-07-11 NOTE — Assessment & Plan Note (Signed)
   Discussed importance of safe sexual practice and condom use. Condoms offered.   Due for dental care with appointment contact info provided in AVS.   Declines vaccinations.   Due for cervical cancer screening to be completed here or Gynecology.

## 2022-07-11 NOTE — Assessment & Plan Note (Signed)
Blood pressure elevated today and has not been taking her lisinopril. No neurological/ophthalmologic signs/symptoms. Discussed importance of blood pressure control especially in the setting of poorly controlled HIV disease which significant increases risk for cardiovascular and renal disease. Restart lisinopril.

## 2022-07-11 NOTE — Assessment & Plan Note (Addendum)
Katie Doyle has been sober for 2 weeks since her last cocaine usage and is attending NA meetings as she is working on being sober to be there for her mother. Long standing challenge with staying in care. Counseling resources offered. Discussed use of naloxone and [facility] naloxone in the setting of continued drug use. Continue to monitor.

## 2022-07-11 NOTE — Assessment & Plan Note (Signed)
Ms. Spackman has poorly controlled virus having been off medication for at least 1 year and possible more. Fortunately she has no signs/symptoms concerning for opportunistic infection. Reviewed previous lab work and discussed importance of follow up and taking medication on a daily basis. Check lab work today including [place] as she has multi-drug resistant virus. Met with [facility]. Restart Symtuza. Plan for follow up in 1 month or sooner if needed.

## 2022-07-11 NOTE — Patient Instructions (Addendum)
Nice to see you.  We will check your lab work today.  Start taking your medication daily as prescribed.  Refills have been sent to the pharmacy.  Please call [facility]) to schedule/follow up on your dental care at [phone]  Plan for follow up in 1 months or sooner if needed with lab work on the same day.  Have a great day and stay safe!

## 2022-07-13 LAB — T-HELPER CELL (CD4) - (RCID CLINIC ONLY)
CD4 % Helper T Cell: 24 % — ABNORMAL LOW (ref 33–65)
CD4 T Cell Abs: 312 /uL — ABNORMAL LOW (ref 400–1790)

## 2022-07-20 LAB — LIPID PANEL
Cholesterol: 214 mg/dL — ABNORMAL HIGH (ref <200)
HDL: 72 mg/dL (ref >=50)
LDL Cholesterol (Calc): 123 mg/dL (calc) — ABNORMAL HIGH
Non-HDL Cholesterol (Calc): 142 mg/dL (calc) — ABNORMAL HIGH (ref <130)
Total CHOL/HDL Ratio: 3 (calc) (ref <5.0)
Triglycerides: 87 mg/dL (ref <150)

## 2022-07-20 LAB — COMPREHENSIVE METABOLIC PANEL
AG Ratio: 1 (calc) (ref 1.0–2.5)
ALT: 10 U/L (ref 6–29)
AST: 15 U/L (ref 10–35)
Albumin: 3.8 g/dL (ref 3.6–5.1)
Alkaline phosphatase (APISO): 112 U/L (ref 37–153)
BUN: 12 mg/dL (ref 7–25)
CO2: 31 mmol/L (ref 20–32)
Calcium: 9 mg/dL (ref 8.6–10.4)
Chloride: 103 mmol/L (ref 98–110)
Creat: 0.72 mg/dL (ref 0.50–1.03)
Globulin: 3.8 g/dL (calc) — ABNORMAL HIGH (ref 1.9–3.7)
Glucose, Bld: 90 mg/dL (ref 65–99)
Potassium: 4.2 mmol/L (ref 3.5–5.3)
Sodium: 139 mmol/L (ref 135–146)
Total Bilirubin: 0.3 mg/dL (ref 0.2–1.2)
Total Protein: 7.6 g/dL (ref 6.1–8.1)

## 2022-07-20 LAB — RPR: RPR Ser Ql: NONREACTIVE

## 2022-07-20 LAB — HIV-1 INTEGRASE GENOTYPE

## 2022-07-20 LAB — HIV RNA, RTPCR W/R GT (RTI, PI,INT)
HIV 1 RNA Quant: 27300 copies/mL — ABNORMAL HIGH
HIV-1 RNA Quant, Log: 4.44 Log copies/mL — ABNORMAL HIGH

## 2022-07-20 LAB — HIV-1 GENOTYPE: HIV-1 Genotype: DETECTED — AB

## 2022-08-07 ENCOUNTER — Ambulatory Visit: Payer: Self-pay | Admitting: Family

## 2022-08-08 ENCOUNTER — Ambulatory Visit: Payer: Self-pay | Admitting: Family

## 2023-11-12 ENCOUNTER — Ambulatory Visit: Payer: Self-pay | Admitting: Family

## 2023-11-12 ENCOUNTER — Other Ambulatory Visit: Payer: Self-pay | Admitting: Family

## 2023-11-12 DIAGNOSIS — B20 Human immunodeficiency virus [HIV] disease: Secondary | ICD-10-CM

## 2023-11-12 DIAGNOSIS — I10 Essential (primary) hypertension: Secondary | ICD-10-CM

## 2023-11-13 NOTE — Telephone Encounter (Signed)
Okay to refill? Patient rescheduled to 12/4

## 2023-11-17 DIAGNOSIS — Z419 Encounter for procedure for purposes other than remedying health state, unspecified: Secondary | ICD-10-CM | POA: Diagnosis not present

## 2023-11-20 ENCOUNTER — Telehealth: Payer: Self-pay

## 2023-11-20 ENCOUNTER — Ambulatory Visit: Payer: Medicaid Other

## 2023-11-20 ENCOUNTER — Ambulatory Visit (INDEPENDENT_AMBULATORY_CARE_PROVIDER_SITE_OTHER): Payer: Self-pay | Admitting: Family

## 2023-11-20 ENCOUNTER — Other Ambulatory Visit: Payer: Self-pay

## 2023-11-20 ENCOUNTER — Encounter: Payer: Self-pay | Admitting: Family

## 2023-11-20 VITALS — BP 145/91 | HR 75 | Temp 98.3°F | Ht 66.0 in | Wt 198.0 lb

## 2023-11-20 DIAGNOSIS — Z0000 Encounter for general adult medical examination without abnormal findings: Secondary | ICD-10-CM

## 2023-11-20 DIAGNOSIS — Z113 Encounter for screening for infections with a predominantly sexual mode of transmission: Secondary | ICD-10-CM | POA: Diagnosis not present

## 2023-11-20 DIAGNOSIS — Z23 Encounter for immunization: Secondary | ICD-10-CM | POA: Diagnosis not present

## 2023-11-20 DIAGNOSIS — F1490 Cocaine use, unspecified, uncomplicated: Secondary | ICD-10-CM

## 2023-11-20 DIAGNOSIS — I10 Essential (primary) hypertension: Secondary | ICD-10-CM

## 2023-11-20 DIAGNOSIS — Z79899 Other long term (current) drug therapy: Secondary | ICD-10-CM | POA: Diagnosis not present

## 2023-11-20 DIAGNOSIS — B20 Human immunodeficiency virus [HIV] disease: Secondary | ICD-10-CM | POA: Diagnosis not present

## 2023-11-20 DIAGNOSIS — T7491XD Unspecified adult maltreatment, confirmed, subsequent encounter: Secondary | ICD-10-CM

## 2023-11-20 DIAGNOSIS — L989 Disorder of the skin and subcutaneous tissue, unspecified: Secondary | ICD-10-CM | POA: Insufficient documentation

## 2023-11-20 DIAGNOSIS — F1420 Cocaine dependence, uncomplicated: Secondary | ICD-10-CM

## 2023-11-20 MED ORDER — SULFAMETHOXAZOLE-TRIMETHOPRIM 800-160 MG PO TABS: 1.0000 | ORAL_TABLET | Freq: Every day | ORAL | 2 refills | Status: DC

## 2023-11-20 MED ORDER — LISINOPRIL 20 MG PO TABS: 20.0000 mg | ORAL_TABLET | Freq: Every day | ORAL | 5 refills | Status: DC

## 2023-11-20 MED ORDER — BIKTARVY 50-200-25 MG PO TABS: 1.0000 | ORAL_TABLET | Freq: Every day | ORAL | 4 refills | Status: DC

## 2023-11-20 NOTE — Telephone Encounter (Signed)
[facility] Counselor Referral Notice  Katie Doyle is agreeable to case management services. She is currently struggling with medication adherence. Housing is unstable, currently staying with her mother, but reports this is not the best situation for her. Resources provided on substance use services with [facility]. She is applying for Medicaid or Katie Doyle today depending on eligibility. Confirmed phone number is active [phone]).   Last HIV Viral Load:  HIV 1 RNA Quant  Date Value Ref Range Status  07/11/2022 27,300 (H) copies/mL Final    Last CD4 Count:  CD4 T Cell Abs  Date Value Ref Range Status  07/11/2022 312 (L) 400 - 1,790 /uL Final     Last [facility] Visit: 11/20/23   Duration of Service: 10 minutes  Sandie Ano, RN

## 2023-11-20 NOTE — Assessment & Plan Note (Signed)
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 Will need biopsy for confirmation and may need referral to Oncology.

## 2023-11-20 NOTE — Assessment & Plan Note (Signed)
Discussed importance of safe sexual practice and condom use. Condoms and STD testing offered.  Vaccinations reviewed - Covid and influenza updated Due for routine dental care.  Will discuss remaining breast cancer, cervical cancer and colon cancer screening at next office visit.
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
## 2023-11-20 NOTE — Assessment & Plan Note (Signed)
Katie Doyle's blood pressure is elevated and has been off medication. Will refill lisinopril. Monitor blood pressure at home as able.

## 2023-11-20 NOTE — Assessment & Plan Note (Signed)
Katie Doyle continues to use cocaine with the last use being last week. This remains a significant contributing factor to her ability to remain in care and on medication. Discussed need for rehabilitation. Referred to [facility] Counselor to connect with resources.

## 2023-11-20 NOTE — Assessment & Plan Note (Signed)
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 Has not been on medication in over two years now. Reviewed previous lab work and discussed importance of taking medication regularly to prevent complications and disease progression of which we may already be seeing advanced disease. Check lab work. Met with financial team and will apply for Medicaid and renew ADAP if needed Sample of Biktarvy provided. Start Bactrim for suspected risk of opportunistic infection. Referred to [facility] Counselor to assist with resources. Plan for follow up in 1 month or sooner if needed.
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
## 2023-11-21 ENCOUNTER — Other Ambulatory Visit (HOSPITAL_COMMUNITY): Payer: Self-pay

## 2023-11-21 ENCOUNTER — Other Ambulatory Visit: Payer: Self-pay

## 2023-11-21 ENCOUNTER — Other Ambulatory Visit: Payer: Self-pay | Admitting: Pharmacist

## 2023-11-21 DIAGNOSIS — B20 Human immunodeficiency virus [HIV] disease: Secondary | ICD-10-CM

## 2023-11-21 LAB — T-HELPER CELL (CD4) - (RCID CLINIC ONLY)
CD4 % Helper T Cell: 24 % — ABNORMAL LOW (ref 33–65)
CD4 T Cell Abs: 240 /uL — ABNORMAL LOW (ref 400–1790)

## 2023-11-21 MED ORDER — BIKTARVY 50-200-25 MG PO TABS: 1.0000 | ORAL_TABLET | Freq: Every day | ORAL | 4 refills | Status: DC

## 2023-11-21 MED ORDER — BICTEGRAVIR-EMTRICITAB-TENOFOV 50-200-25 MG PO TABS: 1.0000 | ORAL_TABLET | Freq: Every day | ORAL | Status: AC

## 2023-11-21 NOTE — Progress Notes (Signed)
Medication Samples have been provided to the patient.  Drug name: Biktarvy        Strength: 50/200/25 mg       Qty: 7 tablets (1 bottles) LOT: CSCFVA   Exp.Date: 10/26  Dosing instructions: Take one tablet by mouth once daily  The patient has been instructed regarding the correct time, dose, and frequency of taking this medication, including desired effects and most common side effects.   Margarite Gouge, PharmD, CPP, BCIDP, AAHIVP Clinical Pharmacist Practitioner Infectious Diseases Clinical Pharmacist [facility] for Infectious Disease

## 2023-11-27 LAB — COMPLETE METABOLIC PANEL WITH GFR
AG Ratio: 0.9 (calc) — ABNORMAL LOW (ref 1.0–2.5)
ALT: 14 U/L (ref 6–29)
AST: 16 U/L (ref 10–35)
Albumin: 3.5 g/dL — ABNORMAL LOW (ref 3.6–5.1)
Alkaline phosphatase (APISO): 103 U/L (ref 37–153)
BUN: 15 mg/dL (ref 7–25)
CO2: 29 mmol/L (ref 20–32)
Calcium: 8.9 mg/dL (ref 8.6–10.4)
Chloride: 105 mmol/L (ref 98–110)
Creat: 0.7 mg/dL (ref 0.50–1.03)
Globulin: 3.9 g/dL — ABNORMAL HIGH (ref 1.9–3.7)
Glucose, Bld: 80 mg/dL (ref 65–99)
Potassium: 4.2 mmol/L (ref 3.5–5.3)
Sodium: 139 mmol/L (ref 135–146)
Total Bilirubin: 0.2 mg/dL (ref 0.2–1.2)
Total Protein: 7.4 g/dL (ref 6.1–8.1)
eGFR: 101 mL/min/{1.73_m2} (ref >=60)

## 2023-11-27 LAB — LIPID PANEL
Cholesterol: 227 mg/dL — ABNORMAL HIGH (ref <200)
HDL: 80 mg/dL (ref >=50)
LDL Cholesterol (Calc): 131 mg/dL — ABNORMAL HIGH
Non-HDL Cholesterol (Calc): 147 mg/dL — ABNORMAL HIGH (ref <130)
Total CHOL/HDL Ratio: 2.8 (calc) (ref <5.0)
Triglycerides: 69 mg/dL (ref <150)

## 2023-11-27 LAB — CBC
HCT: 38 % (ref 35.0–45.0)
Hemoglobin: 12.5 g/dL (ref 11.7–15.5)
MCH: 30.3 pg (ref 27.0–33.0)
MCHC: 32.9 g/dL (ref 32.0–36.0)
MCV: 92 fL (ref 80.0–100.0)
MPV: 11 fL (ref 7.5–12.5)
Platelets: 262 10*3/uL (ref 140–400)
RBC: 4.13 10*6/uL (ref 3.80–5.10)
RDW: 13.5 % (ref 11.0–15.0)
WBC: 4.7 10*3/uL (ref 3.8–10.8)

## 2023-11-27 LAB — RPR TITER

## 2023-11-27 LAB — RPR: RPR Ser Ql: REACTIVE — AB

## 2023-11-27 LAB — T PALLIDUM AB

## 2023-11-27 LAB — HIV-1 RNA QUANT-NO REFLEX-BLD
HIV 1 RNA Quant: 54500 {copies}/mL — ABNORMAL HIGH
HIV-1 RNA Quant, Log: 4.74 {Log_copies}/mL — ABNORMAL HIGH

## 2023-12-02 ENCOUNTER — Telehealth: Payer: Self-pay

## 2023-12-02 DIAGNOSIS — Z113 Encounter for screening for infections with a predominantly sexual mode of transmission: Secondary | ICD-10-CM

## 2023-12-02 NOTE — Telephone Encounter (Signed)
Katie Doyle with the health dept called to follow up on patient RPR testing.   Per [facility] unable to do RPR titer and T-palladium testing due to insufficent quantity.    Attempted to contact patient to see if she is able to come back in to repeat lab work - contacted on mobile number - no answer, VM box full. Home number is mothers phone number - she wasn't with patient.    Will try again later.   Katie Doyle asked for a call back when patient is scheduled for lab appointment. Phone number [phone]  Marcell Anger, CMA

## 2023-12-04 NOTE — Telephone Encounter (Signed)
Lab work added

## 2023-12-04 NOTE — Addendum Note (Signed)
Addended by: Jeanine Luz D on: 12/04/2023 08:28 AM   Modules accepted: Orders

## 2023-12-05 ENCOUNTER — Other Ambulatory Visit: Payer: Medicaid Other

## 2023-12-18 DIAGNOSIS — Z419 Encounter for procedure for purposes other than remedying health state, unspecified: Secondary | ICD-10-CM | POA: Diagnosis not present

## 2023-12-24 ENCOUNTER — Ambulatory Visit: Payer: Medicaid Other | Admitting: Infectious Diseases

## 2023-12-30 ENCOUNTER — Telehealth: Payer: Self-pay

## 2023-12-30 NOTE — Telephone Encounter (Signed)
 Shanda Molt with [facility] called to see if Everlie had rescheduled her appointment after they spoke on 1/9.   Chennel missed her appointment on 1/7 and has not reached out to reschedule. Called to offer her an appointment with Cathlyn, no answer. Left HIPAA compliant voicemail requesting callback.   Shanda requested to be notified if patient schedules [phone]).  Lacee Grey D Mithran Strike, RN

## 2024-01-10 ENCOUNTER — Ambulatory Visit: Payer: Medicaid Other | Admitting: Family

## 2024-01-18 DIAGNOSIS — Z419 Encounter for procedure for purposes other than remedying health state, unspecified: Secondary | ICD-10-CM | POA: Diagnosis not present

## 2024-02-09 ENCOUNTER — Other Ambulatory Visit: Payer: Self-pay

## 2024-02-09 ENCOUNTER — Emergency Department (HOSPITAL_COMMUNITY)
Admission: EM | Admit: 2024-02-09 | Discharge: 2024-02-09 | Discharge status: Against Medical Advice | Payer: Medicaid Other

## 2024-02-09 NOTE — ED Notes (Signed)
 Pt name called several times for triage, still no response.

## 2024-02-15 DIAGNOSIS — Z419 Encounter for procedure for purposes other than remedying health state, unspecified: Secondary | ICD-10-CM | POA: Diagnosis not present

## 2024-03-28 DIAGNOSIS — Z419 Encounter for procedure for purposes other than remedying health state, unspecified: Secondary | ICD-10-CM | POA: Diagnosis not present

## 2024-04-27 DIAGNOSIS — Z419 Encounter for procedure for purposes other than remedying health state, unspecified: Secondary | ICD-10-CM | POA: Diagnosis not present

## 2024-04-28 NOTE — Progress Notes (Signed)
 The 10-year ASCVD risk score (Arnett DK, et al., 2019) is: 12.4%   Values used to calculate the score:     Age: 58 years     Sex: Female     Is Non-Hispanic African American: Yes     Diabetic: No     Tobacco smoker: Yes     Systolic Blood Pressure: 145 mmHg     Is BP treated: Yes     HDL Cholesterol: 80 mg/dL     Total Cholesterol: 227 mg/dL  Arlon Bergamo, BSN, RN

## 2024-07-27 ENCOUNTER — Other Ambulatory Visit: Payer: Self-pay

## 2024-07-27 ENCOUNTER — Emergency Department (HOSPITAL_COMMUNITY)
Admission: EM | Admit: 2024-07-27 | Discharge: 2024-07-27 | Disposition: A | Discharge status: Home / Self Care | Attending: Emergency Medicine | Admitting: Emergency Medicine

## 2024-07-27 ENCOUNTER — Encounter (HOSPITAL_COMMUNITY): Payer: Self-pay | Admitting: Emergency Medicine

## 2024-07-27 DIAGNOSIS — Z8673 Personal history of transient ischemic attack (TIA), and cerebral infarction without residual deficits: Secondary | ICD-10-CM | POA: Insufficient documentation

## 2024-07-27 DIAGNOSIS — Z21 Asymptomatic human immunodeficiency virus [HIV] infection status: Secondary | ICD-10-CM | POA: Insufficient documentation

## 2024-07-27 DIAGNOSIS — Z79899 Other long term (current) drug therapy: Secondary | ICD-10-CM | POA: Diagnosis not present

## 2024-07-27 DIAGNOSIS — I10 Essential (primary) hypertension: Secondary | ICD-10-CM | POA: Diagnosis not present

## 2024-07-27 DIAGNOSIS — J45909 Unspecified asthma, uncomplicated: Secondary | ICD-10-CM | POA: Insufficient documentation

## 2024-07-27 DIAGNOSIS — F1490 Cocaine use, unspecified, uncomplicated: Secondary | ICD-10-CM | POA: Insufficient documentation

## 2024-07-27 LAB — COMPREHENSIVE METABOLIC PANEL WITH GFR
ALT: 12 U/L (ref 0–44)
AST: 16 U/L (ref 15–41)
Albumin: 3.4 g/dL — ABNORMAL LOW (ref 3.5–5.0)
Alkaline Phosphatase: 105 U/L (ref 38–126)
Anion gap: 8 (ref 5–15)
BUN: 15 mg/dL (ref 6–20)
CO2: 24 mmol/L (ref 22–32)
Calcium: 9.3 mg/dL (ref 8.9–10.3)
Chloride: 106 mmol/L (ref 98–111)
Creatinine, Ser: 0.81 mg/dL (ref 0.44–1.00)
GFR, Estimated: >60 mL/min (ref >60)
Glucose, Bld: 107 mg/dL — ABNORMAL HIGH (ref 70–99)
Potassium: 3.4 mmol/L — ABNORMAL LOW (ref 3.5–5.1)
Sodium: 138 mmol/L (ref 135–145)
Total Bilirubin: 0.2 mg/dL (ref 0.0–1.2)
Total Protein: 8.1 g/dL (ref 6.5–8.1)

## 2024-07-27 LAB — CBC
HCT: 36.6 % (ref 36.0–46.0)
Hemoglobin: 11.6 g/dL — ABNORMAL LOW (ref 12.0–15.0)
MCH: 29.7 pg (ref 26.0–34.0)
MCHC: 31.7 g/dL (ref 30.0–36.0)
MCV: 93.6 fL (ref 80.0–100.0)
Platelets: 275 K/uL (ref 150–400)
RBC: 3.91 MIL/uL (ref 3.87–5.11)
RDW: 13.9 % (ref 11.5–15.5)
WBC: 6.3 K/uL (ref 4.0–10.5)
nRBC: 0 % (ref 0.0–0.2)

## 2024-07-27 NOTE — ED Triage Notes (Signed)
 Pt reports she wants detox from cocaine. Reports last usage 2 days ago.

## 2024-07-27 NOTE — ED Provider Notes (Signed)
 [place] EMERGENCY DEPARTMENT AT [facility] Provider Note   CSN: [id number] Arrival date & time: 07/27/24  2224     Patient presents with: No chief complaint on file.   Katie Doyle is a 58 y.o. female.   The history is provided by the patient.  Illness Quality:  Cocaine use, crack for years Severity:  Moderate Onset quality:  Gradual Timing:  Constant Progression:  Unchanged Chronicity:  Chronic Associated symptoms: no chest pain, no congestion and no cough   Patient with a h/o substance abuse wants detox for crack.  No SI or HI no AH or VH    Past Medical History:  Diagnosis Date   Allergy    Anemia    Anxiety    Asthma    rarely uses inhaler   Chronic pain in left shoulder 2013   post shoulder reduction   CVA (cerebral infarction)    ? pt unsure of date   Depression    Fibroids    HIV (human immunodeficiency virus infection) (HCC) 09-2014   Hypertension    Schizophrenia (HCC)    SVD (spontaneous vaginal delivery) 1984   x 1     Prior to Admission medications   Medication Sig Start Date End Date Taking? Authorizing Provider  bictegravir-emtricitabine-tenofovir AF (BIKTARVY ) 50-200-25 MG TABS tablet Take 1 tablet by mouth daily. 11/21/23   Calone, Gregory D, FNP  lisinopril  (ZESTRIL ) 20 MG tablet Take 1 tablet (20 mg total) by mouth daily. 11/20/23   Calone, Gregory D, FNP  naloxone  (NARCAN ) nasal spray 4 mg/0.1 mL Use in event of opiate/narcotic overdose, and seek medical attention right away Patient not taking: Reported on 01/19/2020 11/22/18   Bernard Drivers, MD  sulfamethoxazole -trimethoprim  (BACTRIM  DS) 800-160 MG tablet Take 1 tablet by mouth daily. 11/20/23   Calone, Gregory D, FNP    Allergies: Penicillins    Review of Systems  HENT:  Negative for congestion.   Respiratory:  Negative for cough.   Cardiovascular:  Negative for chest pain.  Psychiatric/Behavioral:  Negative for self-injury.   All other systems reviewed and are  negative.   Updated Vital Signs BP (!) 167/114   Pulse 83   Temp 98.3 F (36.8 C) (Oral)   Resp 18   LMP 01/19/2013   SpO2 99%   Physical Exam Vitals and nursing note reviewed.  Constitutional:      General: She is not in acute distress.    Appearance: Normal appearance. She is well-developed.  HENT:     Head: Normocephalic and atraumatic.     Nose: Nose normal.  Eyes:     Pupils: Pupils are equal, round, and reactive to light.  Cardiovascular:     Rate and Rhythm: Normal rate and regular rhythm.     Pulses: Normal pulses.     Heart sounds: Normal heart sounds.  Pulmonary:     Effort: No respiratory distress.     Breath sounds: Normal breath sounds.  Abdominal:     General: Bowel sounds are normal. There is no distension.     Palpations: Abdomen is soft.     Tenderness: There is no abdominal tenderness. There is no guarding or rebound.  Musculoskeletal:        General: Normal range of motion.     Cervical back: Normal range of motion and neck supple.  Skin:    General: Skin is warm and dry.     Capillary Refill: Capillary refill takes less than 2 seconds.  Findings: No erythema or rash.  Neurological:     General: No focal deficit present.     Mental Status: She is alert and oriented to person, place, and time.     Deep Tendon Reflexes: Reflexes normal.  Psychiatric:        Mood and Affect: Mood normal.        Behavior: Behavior normal.     (all labs ordered are listed, but only abnormal results are displayed) Results for orders placed or performed during the hospital encounter of 07/27/24  Comprehensive metabolic panel   Collection Time: 07/27/24 10:46 PM  Result Value Ref Range   Sodium 138 135 - 145 mmol/L   Potassium 3.4 (L) 3.5 - 5.1 mmol/L   Chloride 106 98 - 111 mmol/L   CO2 24 22 - 32 mmol/L   Glucose, Bld 107 (H) 70 - 99 mg/dL   BUN 15 6 - 20 mg/dL   Creatinine, Ser 9.18 0.44 - 1.00 mg/dL   Calcium 9.3 8.9 - 89.6 mg/dL   Total Protein 8.1 6.5  - 8.1 g/dL   Albumin 3.4 (L) 3.5 - 5.0 g/dL   AST 16 15 - 41 U/L   ALT 12 0 - 44 U/L   Alkaline Phosphatase 105 38 - 126 U/L   Total Bilirubin 0.2 0.0 - 1.2 mg/dL   GFR, Estimated >39 >39 mL/min   Anion gap 8 5 - 15  cbc   Collection Time: 07/27/24 10:46 PM  Result Value Ref Range   WBC 6.3 4.0 - 10.5 K/uL   RBC 3.91 3.87 - 5.11 MIL/uL   Hemoglobin 11.6 (L) 12.0 - 15.0 g/dL   HCT 63.3 63.9 - 53.9 %   MCV 93.6 80.0 - 100.0 fL   MCH 29.7 26.0 - 34.0 pg   MCHC 31.7 30.0 - 36.0 g/dL   RDW 86.0 88.4 - 84.4 %   Platelets 275 150 - 400 K/uL   nRBC 0.0 0.0 - 0.2 %   No results found.   Radiology: No results found.   Procedures   Medications Ordered in the ED - No data to display                                  Medical Decision Making Patient wanting detox from crack   Amount and/or Complexity of Data Reviewed External Data Reviewed: notes.    Details: Previous notes reviewed  Labs: ordered.    Details: Normal sodium 138, potassium slight low 3.4, normal creatinine. Normal white count 6.3, hemoglobin slight 11.6, normal platelets   Risk Risk Details: Patient is well appearing.  No suicidal nor homicidal.  Resources provided.       Final diagnoses:  Cocaine use    No signs of systemic illness or infection. The patient is nontoxic-appearing on exam and vital signs are within normal limits.  I have reviewed the triage vital signs and the nursing notes. Pertinent labs & imaging results that were available during my care of the patient were reviewed by me and considered in my medical decision making (see chart for details). After history, exam, and medical workup I feel the patient has been appropriately medically screened and is safe for discharge home. Pertinent diagnoses were discussed with the patient. Patient was given return precautions.    ED Discharge Orders     None          Callie Facey, MD 07/28/24 9993

## 2024-08-14 ENCOUNTER — Telehealth: Payer: Self-pay

## 2024-08-14 DIAGNOSIS — F1494 Cocaine use, unspecified with cocaine-induced mood disorder: Secondary | ICD-10-CM

## 2024-08-19 ENCOUNTER — Telehealth: Payer: Self-pay

## 2024-08-19 NOTE — Telephone Encounter (Signed)
 Hello [facility] Katie Doyle, cares about your health, and our records show that we may not have seen you since 11/20/2023. The general recommended frequency of visits for your health is every 6 months or as otherwise directed by your provider. Please call [phone] to schedule an appointment.

## 2024-09-01 ENCOUNTER — Telehealth: Payer: Self-pay

## 2024-09-01 NOTE — Progress Notes (Unsigned)
 Complex Care Management Note Care Guide Note  09/01/2024 Name: Katie Doyle MRN: [id number] DOB: [birth year]   Complex Care Management Outreach Attempts: An unsuccessful telephone outreach was attempted today to offer the patient information about available complex care management services.  Follow Up Plan:  Additional outreach attempts will be made to offer the patient complex care management information and services.   Encounter Outcome:  No Answer  Dreama [facility]  [facility], [facility] VBCI Assistant Direct Dial: [phone]  Fax: [phone]

## 2024-09-03 ENCOUNTER — Telehealth: Payer: Self-pay

## 2024-09-03 DIAGNOSIS — B20 Human immunodeficiency virus [HIV] disease: Secondary | ICD-10-CM

## 2024-09-03 DIAGNOSIS — Z113 Encounter for screening for infections with a predominantly sexual mode of transmission: Secondary | ICD-10-CM

## 2024-09-03 NOTE — Telephone Encounter (Signed)
 Patient called to ask if we do TB tests. Informed patient we do by blood test only. Patient scheduled for lab visit on 9/22.   Katie Doyle, I will also order routine labs and will ask front desk to get her scheduled for follow up.    Nanako Stopher SHAUNNA Letters, CMA

## 2024-09-04 NOTE — Progress Notes (Signed)
 Thank you for this referral. The [facility] Health team receives referrals for patients who meet Complex Care Management program criteria: chronic conditions including heart failure, stroke, COPD, ESRD, Sickle Cell, Diabetes with complications, Mental/Behavioral Health diagnosis, substance abuse/misuse and whose Primary Care Provider is a [facility] [place] provider or ACO contracted Building control surveyor in [place]).  This patient does not have a [facility] or [facility] Primary Care Provider. VBCI Population Health cannot directly provide Care Management services to patients who do not have a qualifying Primary Care Provider. Please call us  if you need further clarification at [phone].    Patient given [employer] number [phone]) [phone] information.  [facility]  [facility], [facility] VBCI Assistant Direct Dial: [phone]  Fax: [phone]

## 2024-09-07 ENCOUNTER — Other Ambulatory Visit

## 2024-09-14 ENCOUNTER — Other Ambulatory Visit (HOSPITAL_COMMUNITY)
Admission: RE | Admit: 2024-09-14 | Discharge: 2024-09-14 | Disposition: A | Discharge status: Home / Self Care | Source: Ambulatory Visit | Attending: Family | Admitting: Family

## 2024-09-14 ENCOUNTER — Other Ambulatory Visit: Payer: Self-pay

## 2024-09-14 ENCOUNTER — Other Ambulatory Visit

## 2024-09-14 ENCOUNTER — Telehealth: Payer: Self-pay

## 2024-09-14 DIAGNOSIS — Z113 Encounter for screening for infections with a predominantly sexual mode of transmission: Secondary | ICD-10-CM

## 2024-09-14 DIAGNOSIS — B20 Human immunodeficiency virus [HIV] disease: Secondary | ICD-10-CM | POA: Diagnosis not present

## 2024-09-14 NOTE — Telephone Encounter (Signed)
 Patient in office today for TB lab collection. Patient requestion letter stating she was here in the office today to have blood work drawn. Patient provided with requested letter and printed.   Enis Kleine, LPN

## 2024-09-15 LAB — URINE CYTOLOGY ANCILLARY ONLY
Chlamydia: NEGATIVE
Comment: NEGATIVE
Comment: NORMAL
Neisseria Gonorrhea: NEGATIVE

## 2024-09-15 LAB — T-HELPER CELL (CD4) - (RCID CLINIC ONLY)
CD4 % Helper T Cell: 24 % — ABNORMAL LOW (ref 33–65)
CD4 T Cell Abs: 461 /uL (ref 400–1790)

## 2024-09-17 ENCOUNTER — Ambulatory Visit: Payer: Self-pay | Admitting: Family

## 2024-09-17 DIAGNOSIS — A539 Syphilis, unspecified: Secondary | ICD-10-CM

## 2024-09-17 LAB — CBC WITH DIFFERENTIAL/PLATELET
Absolute Lymphocytes: 2115 {cells}/uL (ref 850–3900)
Absolute Monocytes: 435 {cells}/uL (ref 200–950)
Basophils Absolute: 23 {cells}/uL (ref 0–200)
Basophils Relative: 0.3 %
Eosinophils Absolute: 113 {cells}/uL (ref 15–500)
Eosinophils Relative: 1.5 %
HCT: 38.6 % (ref 35.0–45.0)
Hemoglobin: 12.6 g/dL (ref 11.7–15.5)
MCH: 30.2 pg (ref 27.0–33.0)
MCHC: 32.6 g/dL (ref 32.0–36.0)
MCV: 92.6 fL (ref 80.0–100.0)
MPV: 10.6 fL (ref 7.5–12.5)
Monocytes Relative: 5.8 %
Neutro Abs: 4815 {cells}/uL (ref 1500–7800)
Neutrophils Relative %: 64.2 %
Platelets: 277 Thousand/uL (ref 140–400)
RBC: 4.17 Million/uL (ref 3.80–5.10)
RDW: 13.6 % (ref 11.0–15.0)
Total Lymphocyte: 28.2 %
WBC: 7.5 Thousand/uL (ref 3.8–10.8)

## 2024-09-17 LAB — COMPLETE METABOLIC PANEL WITHOUT GFR
AG Ratio: 1 (calc) (ref 1.0–2.5)
ALT: 9 U/L (ref 6–29)
AST: 15 U/L (ref 10–35)
Albumin: 3.9 g/dL (ref 3.6–5.1)
Alkaline phosphatase (APISO): 95 U/L (ref 37–153)
BUN: 14 mg/dL (ref 7–25)
CO2: 32 mmol/L (ref 20–32)
Calcium: 9.1 mg/dL (ref 8.6–10.4)
Chloride: 102 mmol/L (ref 98–110)
Creat: 0.75 mg/dL (ref 0.50–1.03)
Globulin: 4 g/dL — ABNORMAL HIGH (ref 1.9–3.7)
Glucose, Bld: 79 mg/dL (ref 65–99)
Potassium: 4 mmol/L (ref 3.5–5.3)
Sodium: 139 mmol/L (ref 135–146)
Total Bilirubin: 0.2 mg/dL (ref 0.2–1.2)
Total Protein: 7.9 g/dL (ref 6.1–8.1)

## 2024-09-17 LAB — RPR: RPR Ser Ql: REACTIVE — AB

## 2024-09-17 LAB — QUANTIFERON-TB GOLD PLUS
Mitogen-NIL: 6.65 [IU]/mL
NIL: 0.02 [IU]/mL
QuantiFERON-TB Gold Plus: NEGATIVE
TB1-NIL: <0 [IU]/mL
TB2-NIL: <0 [IU]/mL

## 2024-09-17 LAB — HIV-1 RNA QUANT-NO REFLEX-BLD
HIV 1 RNA Quant: 8140 {copies}/mL — ABNORMAL HIGH
HIV-1 RNA Quant, Log: 3.91 {Log_copies}/mL — ABNORMAL HIGH

## 2024-09-17 LAB — LIPID PANEL
Cholesterol: 210 mg/dL — ABNORMAL HIGH (ref <200)
HDL: 79 mg/dL (ref >=50)
LDL Cholesterol (Calc): 116 mg/dL — ABNORMAL HIGH
Non-HDL Cholesterol (Calc): 131 mg/dL — ABNORMAL HIGH (ref <130)
Total CHOL/HDL Ratio: 2.7 (calc) (ref <5.0)
Triglycerides: 55 mg/dL (ref <150)

## 2024-09-17 LAB — T PALLIDUM AB: T Pallidum Abs: POSITIVE — AB

## 2024-09-17 LAB — RPR TITER: RPR Titer: 1:128 {titer} — ABNORMAL HIGH

## 2024-09-21 ENCOUNTER — Ambulatory Visit: Admitting: Family

## 2024-09-21 NOTE — Progress Notes (Signed)
 Attempted to reach pt. Left voicemail requesting call back Lorenda CHRISTELLA Code, RMA

## 2024-09-23 NOTE — Telephone Encounter (Signed)
 Second attempt to reach [facility], no answer. Left HIPAA compliant voicemail requesting callback.   Alnita Aybar, BSN, RN

## 2024-09-24 MED ORDER — DOXYCYCLINE HYCLATE 100 MG PO TABS: 100.0000 mg | ORAL_TABLET | Freq: Two times a day (BID) | ORAL | 0 refills | Status: DC

## 2024-09-24 NOTE — Addendum Note (Signed)
 Addended by: FLORENE BOUCHARD D on: 09/24/2024 10:46 AM   Modules accepted: Orders

## 2024-09-24 NOTE — Telephone Encounter (Signed)
 Spoke with [name], discussed positive syphilis results and need for treatment. She would like the doxy sent to [facility] on [address].   Advised patient no sex until treatment is completed plus an additional 7 days and instructed to notify sexual partners for testing and treatment. Patient verbalized understanding and has no further questions.    Also requesting that her TB results be faxed to her employer, she will call back when she has that fax number.  Diarra Ceja, BSN, RN

## 2024-10-13 ENCOUNTER — Other Ambulatory Visit: Payer: Self-pay

## 2024-10-13 ENCOUNTER — Ambulatory Visit (HOSPITAL_COMMUNITY)
Admission: EM | Admit: 2024-10-13 | Discharge: 2024-10-13 | Disposition: A | Discharge status: Other Acute Inpatient Hospital | Attending: Behavioral Health | Admitting: Behavioral Health

## 2024-10-13 ENCOUNTER — Encounter (HOSPITAL_COMMUNITY): Payer: Self-pay | Admitting: Behavioral Health

## 2024-10-13 ENCOUNTER — Inpatient Hospital Stay (HOSPITAL_COMMUNITY)
Admission: AD | Admit: 2024-10-13 | Discharge: 2024-10-21 | DRG: 885 | Disposition: A | Discharge status: Home / Self Care | Source: Intra-hospital

## 2024-10-13 DIAGNOSIS — Z90710 Acquired absence of both cervix and uterus: Secondary | ICD-10-CM

## 2024-10-13 DIAGNOSIS — A539 Syphilis, unspecified: Secondary | ICD-10-CM | POA: Diagnosis present

## 2024-10-13 DIAGNOSIS — F331 Major depressive disorder, recurrent, moderate: Principal | ICD-10-CM | POA: Diagnosis present

## 2024-10-13 DIAGNOSIS — Z833 Family history of diabetes mellitus: Secondary | ICD-10-CM | POA: Diagnosis not present

## 2024-10-13 DIAGNOSIS — Z79899 Other long term (current) drug therapy: Secondary | ICD-10-CM

## 2024-10-13 DIAGNOSIS — F411 Generalized anxiety disorder: Secondary | ICD-10-CM | POA: Diagnosis present

## 2024-10-13 DIAGNOSIS — F1490 Cocaine use, unspecified, uncomplicated: Secondary | ICD-10-CM

## 2024-10-13 DIAGNOSIS — Z62811 Personal history of psychological abuse in childhood: Secondary | ICD-10-CM

## 2024-10-13 DIAGNOSIS — Z5982 Transportation insecurity: Secondary | ICD-10-CM

## 2024-10-13 DIAGNOSIS — Z880 Allergy status to penicillin: Secondary | ICD-10-CM

## 2024-10-13 DIAGNOSIS — Z8673 Personal history of transient ischemic attack (TIA), and cerebral infarction without residual deficits: Secondary | ICD-10-CM

## 2024-10-13 DIAGNOSIS — F1424 Cocaine dependence with cocaine-induced mood disorder: Secondary | ICD-10-CM | POA: Diagnosis present

## 2024-10-13 DIAGNOSIS — M25511 Pain in right shoulder: Secondary | ICD-10-CM | POA: Diagnosis present

## 2024-10-13 DIAGNOSIS — F39 Unspecified mood [affective] disorder: Secondary | ICD-10-CM | POA: Insufficient documentation

## 2024-10-13 DIAGNOSIS — F1994 Other psychoactive substance use, unspecified with psychoactive substance-induced mood disorder: Secondary | ICD-10-CM | POA: Insufficient documentation

## 2024-10-13 DIAGNOSIS — B20 Human immunodeficiency virus [HIV] disease: Secondary | ICD-10-CM | POA: Diagnosis present

## 2024-10-13 DIAGNOSIS — I10 Essential (primary) hypertension: Secondary | ICD-10-CM | POA: Diagnosis present

## 2024-10-13 DIAGNOSIS — F1420 Cocaine dependence, uncomplicated: Secondary | ICD-10-CM | POA: Diagnosis not present

## 2024-10-13 DIAGNOSIS — F17210 Nicotine dependence, cigarettes, uncomplicated: Secondary | ICD-10-CM | POA: Diagnosis present

## 2024-10-13 DIAGNOSIS — Z5941 Food insecurity: Secondary | ICD-10-CM

## 2024-10-13 DIAGNOSIS — F1410 Cocaine abuse, uncomplicated: Secondary | ICD-10-CM | POA: Insufficient documentation

## 2024-10-13 DIAGNOSIS — F4321 Adjustment disorder with depressed mood: Secondary | ICD-10-CM | POA: Insufficient documentation

## 2024-10-13 DIAGNOSIS — A530 Latent syphilis, unspecified as early or late: Secondary | ICD-10-CM | POA: Insufficient documentation

## 2024-10-13 DIAGNOSIS — S43004S Unspecified dislocation of right shoulder joint, sequela: Secondary | ICD-10-CM | POA: Diagnosis not present

## 2024-10-13 DIAGNOSIS — Z91411 Personal history of adult psychological abuse: Secondary | ICD-10-CM | POA: Diagnosis not present

## 2024-10-13 DIAGNOSIS — Z8249 Family history of ischemic heart disease and other diseases of the circulatory system: Secondary | ICD-10-CM | POA: Diagnosis not present

## 2024-10-13 DIAGNOSIS — F32A Depression, unspecified: Principal | ICD-10-CM | POA: Diagnosis present

## 2024-10-13 DIAGNOSIS — Z91148 Patient's other noncompliance with medication regimen for other reason: Secondary | ICD-10-CM | POA: Insufficient documentation

## 2024-10-13 LAB — CBC WITH DIFFERENTIAL/PLATELET
Abs Immature Granulocytes: 0.03 K/uL (ref 0.00–0.07)
Basophils Absolute: 0 K/uL (ref 0.0–0.1)
Basophils Relative: 1 %
Eosinophils Absolute: 0.1 K/uL (ref 0.0–0.5)
Eosinophils Relative: 2 %
HCT: 39 % (ref 36.0–46.0)
Hemoglobin: 12.6 g/dL (ref 12.0–15.0)
Immature Granulocytes: 1 %
Lymphocytes Relative: 22 %
Lymphs Abs: 1.3 K/uL (ref 0.7–4.0)
MCH: 29.9 pg (ref 26.0–34.0)
MCHC: 32.3 g/dL (ref 30.0–36.0)
MCV: 92.6 fL (ref 80.0–100.0)
Monocytes Absolute: 0.4 K/uL (ref 0.1–1.0)
Monocytes Relative: 6 %
Neutro Abs: 4.1 K/uL (ref 1.7–7.7)
Neutrophils Relative %: 68 %
Platelets: 262 K/uL (ref 150–400)
RBC: 4.21 MIL/uL (ref 3.87–5.11)
RDW: 14.1 % (ref 11.5–15.5)
WBC: 6 K/uL (ref 4.0–10.5)
nRBC: 0 % (ref 0.0–0.2)

## 2024-10-13 LAB — TSH: TSH: 0.427 u[IU]/mL (ref 0.350–4.500)

## 2024-10-13 LAB — COMPREHENSIVE METABOLIC PANEL WITH GFR
ALT: 12 U/L (ref 0–44)
AST: 16 U/L (ref 15–41)
Albumin: 3.5 g/dL (ref 3.5–5.0)
Alkaline Phosphatase: 97 U/L (ref 38–126)
Anion gap: 12 (ref 5–15)
BUN: 14 mg/dL (ref 6–20)
CO2: 27 mmol/L (ref 22–32)
Calcium: 9 mg/dL (ref 8.9–10.3)
Chloride: 100 mmol/L (ref 98–111)
Creatinine, Ser: 0.77 mg/dL (ref 0.44–1.00)
GFR, Estimated: >60 mL/min (ref >60)
Glucose, Bld: 88 mg/dL (ref 70–99)
Potassium: 4.2 mmol/L (ref 3.5–5.1)
Sodium: 139 mmol/L (ref 135–145)
Total Bilirubin: 0.2 mg/dL (ref 0.0–1.2)
Total Protein: 7.4 g/dL (ref 6.5–8.1)

## 2024-10-13 LAB — HEPATITIS PANEL, ACUTE
HCV Ab: NONREACTIVE
Hep A IgM: NONREACTIVE
Hep B C IgM: NONREACTIVE
Hepatitis B Surface Ag: NONREACTIVE

## 2024-10-13 LAB — LIPID PANEL
Cholesterol: 217 mg/dL — ABNORMAL HIGH (ref 0–200)
HDL: 75 mg/dL (ref >40)
LDL Cholesterol: 132 mg/dL — ABNORMAL HIGH (ref 0–99)
Total CHOL/HDL Ratio: 2.9 ratio
Triglycerides: 50 mg/dL (ref <150)
VLDL: 10 mg/dL (ref 0–40)

## 2024-10-13 LAB — URINALYSIS, ROUTINE W REFLEX MICROSCOPIC
Bilirubin Urine: NEGATIVE
Glucose, UA: NEGATIVE mg/dL
Hgb urine dipstick: NEGATIVE
Ketones, ur: NEGATIVE mg/dL
Leukocytes,Ua: NEGATIVE
Nitrite: NEGATIVE
Protein, ur: NEGATIVE mg/dL
Specific Gravity, Urine: 1.017 (ref 1.005–1.030)
pH: 6 (ref 5.0–8.0)

## 2024-10-13 LAB — POC URINE PREG, ED: Preg Test, Ur: NEGATIVE

## 2024-10-13 LAB — POCT URINE DRUG SCREEN - MANUAL ENTRY (I-SCREEN)
POC Amphetamine UR: NOT DETECTED
POC Buprenorphine (BUP): NOT DETECTED
POC Cocaine UR: POSITIVE — AB
POC Marijuana UR: POSITIVE — AB
POC Methadone UR: NOT DETECTED
POC Methamphetamine UR: NOT DETECTED
POC Morphine: NOT DETECTED
POC Oxazepam (BZO): NOT DETECTED
POC Oxycodone UR: NOT DETECTED
POC Secobarbital (BAR): NOT DETECTED

## 2024-10-13 LAB — HEMOGLOBIN A1C
Hgb A1c MFr Bld: 5.6 % (ref 4.8–5.6)
Mean Plasma Glucose: 114.02 mg/dL

## 2024-10-13 LAB — ETHANOL: Alcohol, Ethyl (B): <15 mg/dL (ref <15)

## 2024-10-13 MED ORDER — LORAZEPAM 2 MG/ML IJ SOLN: 2.0000 mg | Freq: Three times a day (TID) | INTRAMUSCULAR | Status: DC | PRN

## 2024-10-13 MED ORDER — NICOTINE 14 MG/24HR TD PT24
14.0000 mg | MEDICATED_PATCH | Freq: Every day | TRANSDERMAL | Status: DC
  Filled 2024-10-13: qty 1

## 2024-10-13 MED ORDER — HALOPERIDOL LACTATE 5 MG/ML IJ SOLN: 5.0000 mg | Freq: Three times a day (TID) | INTRAMUSCULAR | Status: DC | PRN

## 2024-10-13 MED ORDER — ALBUTEROL SULFATE HFA 108 (90 BASE) MCG/ACT IN AERS: 2.0000 | INHALATION_SPRAY | Freq: Four times a day (QID) | RESPIRATORY_TRACT | Status: DC | PRN

## 2024-10-13 MED ORDER — LISINOPRIL 20 MG PO TABS
20.0000 mg | ORAL_TABLET | Freq: Every day | ORAL | Status: DC
  Administered 2024-10-14 – 2024-10-16 (×3): 20 mg via ORAL
  Filled 2024-10-13 (×3): qty 1

## 2024-10-13 MED ORDER — BICTEGRAVIR-EMTRICITAB-TENOFOV 50-200-25 MG PO TABS
1.0000 | ORAL_TABLET | Freq: Every day | ORAL | Status: DC
  Administered 2024-10-13: 1 via ORAL
  Filled 2024-10-13: qty 1

## 2024-10-13 MED ORDER — TRAZODONE HCL 50 MG PO TABS: 50.0000 mg | ORAL_TABLET | Freq: Every evening | ORAL | Status: DC | PRN

## 2024-10-13 MED ORDER — DIPHENHYDRAMINE HCL 50 MG/ML IJ SOLN: 50.0000 mg | Freq: Three times a day (TID) | INTRAMUSCULAR | Status: DC | PRN

## 2024-10-13 MED ORDER — HALOPERIDOL 5 MG PO TABS: 5.0000 mg | ORAL_TABLET | Freq: Three times a day (TID) | ORAL | Status: DC | PRN

## 2024-10-13 MED ORDER — HYDROXYZINE HCL 25 MG PO TABS: 25.0000 mg | ORAL_TABLET | Freq: Three times a day (TID) | ORAL | Status: DC | PRN

## 2024-10-13 MED ORDER — TRAZODONE HCL 50 MG PO TABS
50.0000 mg | ORAL_TABLET | Freq: Every evening | ORAL | Status: DC | PRN
  Administered 2024-10-13 – 2024-10-20 (×8): 50 mg via ORAL
  Filled 2024-10-13 (×8): qty 1

## 2024-10-13 MED ORDER — DIPHENHYDRAMINE HCL 25 MG PO CAPS
50.0000 mg | ORAL_CAPSULE | Freq: Three times a day (TID) | ORAL | Status: DC | PRN
  Administered 2024-10-16: 50 mg via ORAL
  Filled 2024-10-13: qty 2

## 2024-10-13 MED ORDER — ACETAMINOPHEN 325 MG PO TABS
650.0000 mg | ORAL_TABLET | Freq: Four times a day (QID) | ORAL | Status: DC | PRN
  Administered 2024-10-19: 650 mg via ORAL
  Filled 2024-10-13: qty 2

## 2024-10-13 MED ORDER — BICTEGRAVIR-EMTRICITAB-TENOFOV 50-200-25 MG PO TABS
1.0000 | ORAL_TABLET | Freq: Every day | ORAL | Status: DC
  Administered 2024-10-14 – 2024-10-21 (×8): 1 via ORAL
  Filled 2024-10-13 (×3): qty 1

## 2024-10-13 MED ORDER — DOXYCYCLINE HYCLATE 100 MG PO TABS
100.0000 mg | ORAL_TABLET | Freq: Two times a day (BID) | ORAL | Status: DC
  Administered 2024-10-13: 100 mg via ORAL
  Filled 2024-10-13: qty 1

## 2024-10-13 MED ORDER — LISINOPRIL 20 MG PO TABS
20.0000 mg | ORAL_TABLET | Freq: Every day | ORAL | Status: DC
Start: 2024-10-13 — End: 2024-10-13
  Administered 2024-10-13: 20 mg via ORAL
  Filled 2024-10-13: qty 1

## 2024-10-13 MED ORDER — HALOPERIDOL LACTATE 5 MG/ML IJ SOLN: 10.0000 mg | Freq: Three times a day (TID) | INTRAMUSCULAR | Status: DC | PRN

## 2024-10-13 MED ORDER — ALUM & MAG HYDROXIDE-SIMETH 200-200-20 MG/5ML PO SUSP: 30.0000 mL | ORAL | Status: DC | PRN

## 2024-10-13 MED ORDER — DOXYCYCLINE HYCLATE 100 MG PO TABS
100.0000 mg | ORAL_TABLET | Freq: Two times a day (BID) | ORAL | Status: DC
  Administered 2024-10-13 – 2024-10-21 (×16): 100 mg via ORAL
  Filled 2024-10-13 (×16): qty 1

## 2024-10-13 MED ORDER — ACETAMINOPHEN 325 MG PO TABS: 650.0000 mg | ORAL_TABLET | Freq: Four times a day (QID) | ORAL | Status: DC | PRN

## 2024-10-13 MED ORDER — INFLUENZA VIRUS VACC SPLIT PF (FLUZONE) 0.5 ML IM SUSY
0.5000 mL | PREFILLED_SYRINGE | INTRAMUSCULAR | Status: DC
  Filled 2024-10-13: qty 0.5

## 2024-10-13 MED ORDER — DIPHENHYDRAMINE HCL 50 MG PO CAPS: 50.0000 mg | ORAL_CAPSULE | Freq: Three times a day (TID) | ORAL | Status: DC | PRN

## 2024-10-13 MED ORDER — MAGNESIUM HYDROXIDE 400 MG/5ML PO SUSP: 30.0000 mL | Freq: Every day | ORAL | Status: DC | PRN

## 2024-10-13 MED ORDER — HYDROXYZINE HCL 25 MG PO TABS
25.0000 mg | ORAL_TABLET | Freq: Three times a day (TID) | ORAL | Status: DC | PRN
  Administered 2024-10-13 – 2024-10-15 (×3): 25 mg via ORAL
  Filled 2024-10-13 (×3): qty 1

## 2024-10-13 NOTE — Plan of Care (Signed)
   Problem: Education: Goal: Knowledge of [place] [facility] Education information/materials will improve Outcome: Progressing Goal: Emotional status will improve Outcome: Progressing Goal: Mental status will improve Outcome: Progressing Goal: Verbalization of understanding the information provided will improve Outcome: Progressing

## 2024-10-13 NOTE — Discharge Instructions (Signed)
 Accepted to [facility] pending stable labs

## 2024-10-13 NOTE — Progress Notes (Addendum)
 Pt has been accepted to [facility] on 10/13/2024 Bed assignment: 303-02  Pt meets inpatient criteria per: Wyline Pizza NP  Attending Physician will be: Leita Arts MD  Report can be called to:  unit: Adult unit: [phone]  Pt can arrive after pending CBC, CMP ETHANOL UDS EKG. UPREG   Care Team Notified: [facility] Cherylynn Ernst RN, Lajuana Nett RN, Shanda Fiscus RN   [place] Norene Oliveri LCSW-A   10/13/2024 2:52 PM

## 2024-10-13 NOTE — ED Provider Notes (Cosign Needed Addendum)
 Behavioral Health Urgent Care Medical Screening Exam  Patient Name: Katie Doyle MRN: [id number] Date of Evaluation: 10/13/24 Chief Complaint:   requesting substance abuse detoxification for cocaine use. Diagnosis:  Final diagnoses:  Cocaine use  Grief  Mood disorder    History of Present illness: Katie Doyle is a 58 y.o. female patient with no reported psychiatric history who presented to the [facility] voluntary accompanied by her daughter requesting substance abuse detoxification for cocaine use.  Patient reports smoking cocaine for over 33+ years. She reports using cocaine every day, on average $200-$300 worth per day. She reports that she last used cocaine yesterday. She reports smoking marijuana for 45 years, on average 2-3 joints per day. She states that she last used marijuana this morning. She reports intermittent alcohol use since she was 58 years old and reports occasional alcohol use. She denies a history of alcohol withdrawal, seizures or delirium tremens. She reports substance abuse treatment years ago in [place], [place]  and at [facility].  She is currently interested in long-term substance abuse rehabilitation.  She identifies triggers to her drug use as hard to think about her brother who died a couple years ago and her mother who died in 04-25-24 October 26, 2024). She reports feeling depressed for the past couple years and endorses depressive symptoms of sadness, hopelessness, worthlessness, isolating, crying spells, irritability, decreased energy, decreased motivation, and anhedonia. She also reports feeling moody and describes moody as feeling irritable and agitated. She reports poor sleep, sleeping less than 5 hours per night. She reports a decreased appetite and reports eating 1 meal per day. She is unsure of recent weight loss. She denies a history of hypomania or mania. She denies a history of psychosis. She denies auditory or visual  hallucinations. No paranoia. Objectively, no signs of acute psychosis. She denies suicidal thoughts. She reports 1 past suicide attempt years ago by overdosing. She denies homicidal thoughts.  She states that she was living with her mother until her mother died in 2024/04/25 26-Oct-2024). The patient's daughter states that the house does not have any utilities on. Her daughter also states that her mother cannot live with her until she gets help for her drug addiction. Patient has 2 adult daughters. Patient is unemployed.   Patient denies a past psychiatric history. She denies past inpatient psychiatric hospitalizations. She denies outpatient psychiatry or therapy.  She reports a medical history of HIV, diagnosed years ago due to sexual intercourse, recently diagnosed with syphilis 1 month ago, and history of hypertension. She reports taking Biktarvy , lisinopril  and a new medication for syphilis (started on new medication that she hasn't started). She reports medication noncompliance x 1 month. She is noted to be hypertensive on exam and states that she has not taken her lisinopril . She is asymptomatic, no chest pain, shortness of breath, persistent headache, confusion, or weakness in extremities. Will administer lisinopril  20 mg po and having nursing repeat b/p manually. At 3:57pm b/p 146/90   Flowsheet Row ED from 10/13/2024 in [facility] ED from 07/27/2024 in [facility] Emergency Department at [facility]  C-SSRS RISK CATEGORY Moderate Risk No Risk    Psychiatric Specialty Exam  Presentation  General Appearance:Disheveled  Eye Contact:Fair  Speech:Clear and Coherent  Speech Volume:Normal  Mood and Affect  Mood:Depressed  Affect:Tearful   Thought Process  Thought Processes:Coherent  Descriptions of Associations:Intact  Orientation:Full (Time, Place and Person)  Thought Content:Logical    Hallucinations:None  Ideas of  Reference:None  Suicidal  Thoughts:No  Homicidal Thoughts:No   Sensorium  Memory:Immediate Fair; Remote Fair; Recent Fair  Judgment:Intact  Insight:Fair   Executive Functions  Concentration:Fair  Attention Span:Fair  Recall:Fair  Fund of Knowledge:Fair  Language:Fair   Psychomotor Activity  Psychomotor Activity:Normal   Assets  Assets:Communication Skills; Desire for Improvement   Sleep  Sleep:Poor  Number of hours: No data recorded  Physical Exam: Physical Exam Cardiovascular:     Rate and Rhythm: Normal rate.     Comments: Hypertensive  Pulmonary:     Effort: Pulmonary effort is normal.  Musculoskeletal:        General: Normal range of motion.  Neurological:     Mental Status: She is alert and oriented to person, place, and time.    Review of Systems  Constitutional: Negative.   HENT: Negative.    Eyes: Negative.   Respiratory: Negative.    Cardiovascular: Negative.   Gastrointestinal: Negative.   Genitourinary: Negative.   Musculoskeletal: Negative.   Neurological: Negative.   Endo/Heme/Allergies: Negative.   Psychiatric/Behavioral:  Positive for depression and substance abuse.    Blood pressure (!) 150/110, pulse 86, temperature 98.4 F (36.9 C), temperature source Oral, resp. rate 16, last menstrual period 01/19/2013, SpO2 96%. There is no height or weight on file to calculate BMI.  Musculoskeletal: Strength & Muscle Tone: within normal limits Gait & Station: normal Patient leans: N/A   [facility] MSE Discharge Disposition for Follow up and Recommendations: Based on my evaluation I certify that psychiatric inpatient services furnished can reasonably be expected to improve the patient's condition which I recommend transfer to an appropriate accepting facility.   Katie Doyle is a 58 year old female patient with reports of worsening depression, grief, cocaine use, and marijuana use. Patient is recommended for inpatient psychiatric treatment for mood stabilization,  medication management and substance abuse treatment. Patient accepted to [facility] pending stable labs.   Lab Orders         CBC with Differential/Platelet         Comprehensive metabolic panel         Hemoglobin A1c         Ethanol         Lipid panel         TSH         Hepatitis panel, acute         Urinalysis, Routine w reflex microscopic -Urine, Random         POC urine preg, ED         POCT Urine Drug Screen - (I-Screen)    EKG  Medication regimen  Restart home medications Lisinopril  20 mg p.o. daily for hypertension Biktarvy  50-200-25-milligrams daily for HIV. I consulted with Cordella July, FNP, with infectious disease via secure chat to confirm restarting medication at last prescribed dose. Cordella states, Yes that would be fine as her last CD4 count was okay along with the doxycycline.  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

## 2024-10-13 NOTE — ED Notes (Signed)
 Pt admitted to observation pending transfer to [facility] after labs result due to depression and grief over recent loss of her mother and brother and request detox and residential for cocaine use. Pt reports last use of cocaine being yesterday. Denies withdrawal sx or discomfort at present. Tearful at times, talking about her mother. Denies SI/HI/AVH. Oriented to unit. Meal and drink offered. Pt able to contract for safety in facility. Will monitor for safety.

## 2024-10-13 NOTE — ED Notes (Signed)
 Pt transported to [facility] via safe transport. Belongings transported with pt to facility. Safety maintained.

## 2024-10-14 ENCOUNTER — Encounter (HOSPITAL_COMMUNITY): Payer: Self-pay

## 2024-10-14 DIAGNOSIS — F1994 Other psychoactive substance use, unspecified with psychoactive substance-induced mood disorder: Secondary | ICD-10-CM | POA: Insufficient documentation

## 2024-10-14 MED ORDER — NICOTINE POLACRILEX 2 MG MT GUM
2.0000 mg | CHEWING_GUM | OROMUCOSAL | Status: DC | PRN
  Administered 2024-10-14: 2 mg via ORAL

## 2024-10-14 MED ORDER — SERTRALINE HCL 25 MG PO TABS
25.0000 mg | ORAL_TABLET | Freq: Every day | ORAL | Status: DC
  Administered 2024-10-14 – 2024-10-15 (×2): 25 mg via ORAL
  Filled 2024-10-14 (×2): qty 1

## 2024-10-14 NOTE — Progress Notes (Signed)
 58 y.o. female admitted to room 303-2. Pt was brought into ER d/t cocaine addiction for 33 plus years. Pt also drinks alcohol but states it isnt a problem and she has never had withdrawls from alcohol. Denies any SI/HI or AVH at this time. Patient states her stressors are her brother dying a couple yrs ago and her mother whom she was living with who died in 04/24/2024 this year. Patient was very tearful stateing she wants to get her life together. Sates she will be able to live with her daughter if she can stay off the drugs. Pt states she wants to get to know her grandchildren. Pt states she has lost 100lbs in the last year d/t decreased appetite. Pt c/o anxiety, decreased concentration, confusion, crying spells, sadness, worry, tension and depression. Pt states she does not have a PCP. She is not sure if she has diabetes, but states she would like to be checked for ot because it runs in her family. The two things she wants to work on is how to forgive herself for the wrongs she has done and to get her mind rightthe patient was oriented to unit and room, all questions addresses. 15 min checks maintained

## 2024-10-14 NOTE — Group Note (Signed)
 Date:  10/14/2024 Time:  4:24 PM  Group Topic/Focus:  Dimensions of Wellness:   The focus of this group is to introduce the topic of wellness and discuss the role each dimension of wellness plays in total health.    Participation Level:  Active  Participation Quality:  Appropriate  Affect:  Appropriate  Cognitive:  Appropriate  Insight: Appropriate  Engagement in Group:  Engaged  Modes of Intervention:  Discussion   Katie Doyle  Katie Doyle 10/14/2024, 4:24 PM

## 2024-10-14 NOTE — Group Note (Signed)
 Date:  10/14/2024 Time:  1:12 PM  Group Topic/Focus:     Pharmacology explaining to patients about the different Kind of patients    Participation Level:  Active  Participation Quality:  Appropriate  Affect:  Appropriate  Cognitive:  Appropriate  Insight: Appropriate and Good  Engagement in Group:  Improving  Modes of Intervention:  Orientation  Additional Comments:    Dolores CHRISTELLA Fredericks 10/14/2024, 1:12 PM

## 2024-10-14 NOTE — Group Note (Signed)
 Date:  10/14/2024 Time:  1:39 PM  Group Topic/Focus:  Emotional Education:   The focus of this group is to discuss what feelings/emotions are, and how they are experienced.  Chaplin Group when patients attend and talk about the topic strength.  Participation Level:  Did Not Attend   Dolores CHRISTELLA Fredericks 10/14/2024, 1:39 PM

## 2024-10-14 NOTE — Group Note (Signed)
 Date:  10/14/2024 Time:  11:46 AM  Group Topic/Focus:  Recreational Therapy- Patients are using their mind to solve problems.    Participation Level:  Active  Participation Quality:  Appropriate  Affect:  Appropriate  Cognitive:  Alert  Insight: Appropriate and Good  Engagement in Group:  Improving  Modes of Intervention:  Orientation  Additional Comments:  Patient attended and done Recreational Therapy   Dolores CHRISTELLA Fredericks 10/14/2024, 11:46 AM

## 2024-10-14 NOTE — Progress Notes (Signed)
(  Sleep Hours) -7.25  (Any PRNs that were needed, meds refused, or side effects to meds)- Vistaril  25 mg , Trazodone  50 mg  (Any disturbances and when (visitation, over night)- none   (Concerns raised by the patient)- none , Ask me 3 discussed with pt and verbal understanding stated.    (SI/HI/AVH)- denies

## 2024-10-14 NOTE — Progress Notes (Signed)
   10/14/24 1400  Psych Admission Type (Psych Patients Only)  Admission Status Voluntary  Psychosocial Assessment  Patient Complaints Anxiety;Depression  Eye Contact Fair  Facial Expression Anxious;Sad;Worried  Affect Anxious;Sad  Speech Logical/coherent  Interaction Assertive  Motor Activity Slow  Appearance/Hygiene Unremarkable  Behavior Characteristics Cooperative;Appropriate to situation  Mood Anxious  Thought Process  Coherency WDL  Content WDL  Delusions None reported or observed  Perception WDL  Hallucination None reported or observed  Judgment Poor  Confusion None  Danger to Self  Current suicidal ideation? Denies  Description of Suicide Plan No plan  Agreement Not to Harm Self Yes  Description of Agreement Verbal  Danger to Others  Danger to Others None reported or observed

## 2024-10-14 NOTE — BHH Suicide Risk Assessment (Signed)
 [facility] Admission Suicide Risk Assessment   Nursing information obtained from:  Patient Demographic factors:  Low socioeconomic status Current Mental Status:  NA Loss Factors:  Loss of significant relationship Historical Factors:  Prior suicide attempts Risk Reduction Factors:  Positive social support  Total Time spent with patient: 45 min Principal Problem: Cocaine use disorder, severe, dependence (HCC) Diagnosis:  Principal Problem:   Cocaine use disorder, severe, dependence (HCC) Active Problems:   HIV disease (HCC)   Substance induced mood disorder (HCC)   Subjective Data:  The patient is a 58 year old female with a history of cocaine abuse, no prior psychiatric admissions.  She presented to the behavioral health urgent care accompanied by her daughter requesting substance use treatment.  She is admitted to the [facility] on a voluntary basis.  Pertinent medical problems include HIV and recent syphilis.      Continued Clinical Symptoms:  Alcohol Use Disorder Identification Test Final Score (AUDIT): 2 The Alcohol Use Disorders Identification Test, Guidelines for Use in Primary Care, Second Edition.  World Science Writer [facility]). Score between 0-7:  no or low risk or alcohol related problems. Score between 8-15:  moderate risk of alcohol related problems. Score between 16-19:  high risk of alcohol related problems. Score 20 or above:  warrants further diagnostic evaluation for alcohol dependence and treatment.   CLINICAL FACTORS:   Depression:   Severe  Psychiatric Specialty Exam: Physical Exam Constitutional:      Appearance: the patient is not toxic-appearing.  Pulmonary:     Effort: Pulmonary effort is normal.  Neurological:     General: No focal deficit present.     Mental Status: the patient is alert and oriented to person, place, and time.   Review of Systems  Respiratory:  Negative for shortness of breath.   Cardiovascular:  Negative  for chest pain.  Gastrointestinal:  Negative for abdominal pain, constipation, diarrhea, nausea and vomiting.  Neurological:  Negative for headaches.      BP (!) 151/81   Pulse 75   Temp 98.4 F (36.9 C) (Oral)   Ht 5' 5 (1.651 m)   Wt 85.3 kg   LMP 01/19/2013   SpO2 100%   BMI 31.28 kg/m   General Appearance: Fairly Groomed  Eye Contact:  Good  Speech:  Clear and Coherent  Volume:  Normal  Mood: Down  Affect:  Congruent  Thought Process:  Coherent  Orientation:  Full (Time, Place, and Person)  Thought Content: Logical   Suicidal Thoughts:  No  Homicidal Thoughts:  No  Memory:  Immediate;   Good  Judgement:  poor  Insight:  poor  Psychomotor Activity:  Normal  Concentration:  Concentration: Good  Recall:  Good  Fund of Knowledge: Good  Language: Good  Akathisia:  No  Handed:  not assessed  AIMS (if indicated): not done  Assets:  Communication Skills Desire for Improvement Financial Resources/Insurance Housing Leisure Time Physical Health  ADL's:  Intact  Cognition: WNL  Sleep:  Fair      COGNITIVE FEATURES THAT CONTRIBUTE TO RISK:  Closed-mindedness    SUICIDE RISK:  Moderate: Frequent suicidal ideation with limited intensity, and duration, some specificity in terms of plans, no associated intent, good self-control, limited dysphoria/symptomatology, some risk factors present, and identifiable protective factors, including available and accessible social support.  PLAN OF CARE: See H and P  I certify that inpatient services furnished can reasonably be expected to improve the patient's condition.   Karleen Kaufmann, MD  PGY-4

## 2024-10-14 NOTE — Group Note (Signed)
 Date:  10/14/2024 Time:  11:34 AM  Group Topic/Focus:  Developing a Wellness Toolbox:   The focus of this group is to help patients develop a wellness toolbox with skills and strategies to promote recovery upon discharge. Spiritual Wellness assessment orientation    Participation Level:  Active  Participation Quality:  Appropriate  Affect:  Appropriate  Cognitive:  Alert and Appropriate  Insight: Appropriate and Good  Engagement in Group:  Engaged  Modes of Intervention:  Orientation  Additional Comments:  Patient attended Group and did assessment task with spiritual wellness.   Srihari Shellhammer M Laquia Rosano 10/14/2024, 11:34 AM

## 2024-10-14 NOTE — BH IP Treatment Plan (Signed)
 Interdisciplinary Treatment and Diagnostic Plan Update  10/14/2024 Time of Session: 1055am Katie Doyle MRN: [id number]  Principal Diagnosis: Depression  Secondary Diagnoses: Principal Problem:   Depression   Current Medications:  Current Facility-Administered Medications  Medication Dose Route Frequency Provider Last Rate Last Admin   acetaminophen  (TYLENOL ) tablet 650 mg  650 mg Oral Q6H PRN White, Patrice L, NP       albuterol  (VENTOLIN  HFA) 108 (90 Base) MCG/ACT inhaler 2 puff  2 puff Inhalation Q6H PRN White, Patrice L, NP       bictegravir-emtricitabine-tenofovir AF (BIKTARVY ) 50-200-25 MG per tablet 1 tablet  1 tablet Oral Daily White, Patrice L, NP   1 tablet at 10/14/24 0841   haloperidol (HALDOL) tablet 5 mg  5 mg Oral TID PRN White, Patrice L, NP       And   diphenhydrAMINE  (BENADRYL ) capsule 50 mg  50 mg Oral TID PRN White, Patrice L, NP       haloperidol lactate (HALDOL) injection 5 mg  5 mg Intramuscular TID PRN White, Patrice L, NP       And   diphenhydrAMINE  (BENADRYL ) injection 50 mg  50 mg Intramuscular TID PRN White, Patrice L, NP       And   LORazepam  (ATIVAN ) injection 2 mg  2 mg Intramuscular TID PRN White, Patrice L, NP       haloperidol lactate (HALDOL) injection 10 mg  10 mg Intramuscular TID PRN White, Patrice L, NP       And   diphenhydrAMINE  (BENADRYL ) injection 50 mg  50 mg Intramuscular TID PRN White, Patrice L, NP       And   LORazepam  (ATIVAN ) injection 2 mg  2 mg Intramuscular TID PRN White, Patrice L, NP       doxycycline (VIBRA-TABS) tablet 100 mg  100 mg Oral BID White, Patrice L, NP   100 mg at 10/14/24 0840   hydrOXYzine  (ATARAX ) tablet 25 mg  25 mg Oral TID PRN White, Patrice L, NP   25 mg at 10/13/24 2113   lisinopril  (ZESTRIL ) tablet 20 mg  20 mg Oral Daily White, Patrice L, NP   20 mg at 10/14/24 0840   nicotine polacrilex (NICORETTE) gum 2 mg  2 mg Oral PRN Prentis Kitchens A, DO   2 mg at 10/14/24 9156   traZODone  (DESYREL ) tablet 50 mg   50 mg Oral QHS PRN White, Patrice L, NP   50 mg at 10/13/24 2113   PTA Medications: Medications Prior to Admission  Medication Sig Dispense Refill Last Dose/Taking   albuterol  (VENTOLIN  HFA) 108 (90 Base) MCG/ACT inhaler Inhale 2 puffs into the lungs every 6 (six) hours as needed for wheezing or shortness of breath. (Patient not taking: Reported on 10/13/2024)      bictegravir-emtricitabine-tenofovir AF (BIKTARVY ) 50-200-25 MG TABS tablet Take 1 tablet by mouth daily. (Patient not taking: Reported on 10/13/2024) 30 tablet 4    doxycycline (VIBRA-TABS) 100 MG tablet Take 1 tablet (100 mg total) by mouth 2 (two) times daily for 28 days. (Patient not taking: Reported on 10/13/2024) 56 tablet 0    lisinopril  (ZESTRIL ) 20 MG tablet Take 1 tablet (20 mg total) by mouth daily. 30 tablet 5     Patient Stressors:    Patient Strengths:    Treatment Modalities: Medication Management, Group therapy, Case management,  1 to 1 session with clinician, Psychoeducation, Recreational therapy.   Physician Treatment Plan for Primary Diagnosis: Depression Long Term Goal(s):  Short Term Goals:    Medication Management: Evaluate patient's response, side effects, and tolerance of medication regimen.  Therapeutic Interventions: 1 to 1 sessions, Unit Group sessions and Medication administration.  Evaluation of Outcomes: Not Progressing  Physician Treatment Plan for Secondary Diagnosis: Principal Problem:   Depression  Long Term Goal(s):     Short Term Goals:       Medication Management: Evaluate patient's response, side effects, and tolerance of medication regimen.  Therapeutic Interventions: 1 to 1 sessions, Unit Group sessions and Medication administration.  Evaluation of Outcomes: Not Progressing   RN Treatment Plan for Primary Diagnosis: Depression Long Term Goal(s): Knowledge of disease and therapeutic regimen to maintain health will improve  Short Term Goals: Ability to remain free from  injury will improve, Ability to verbalize frustration and anger appropriately will improve, Ability to demonstrate self-control, Ability to participate in decision making will improve, Ability to verbalize feelings will improve, Ability to disclose and discuss suicidal ideas, Ability to identify and develop effective coping behaviors will improve, and Compliance with prescribed medications will improve  Medication Management: RN will administer medications as ordered by provider, will assess and evaluate patient's response and provide education to patient for prescribed medication. RN will report any adverse and/or side effects to prescribing provider.  Therapeutic Interventions: 1 on 1 counseling sessions, Psychoeducation, Medication administration, Evaluate responses to treatment, Monitor vital signs and CBGs as ordered, Perform/monitor CIWA, COWS, AIMS and Fall Risk screenings as ordered, Perform wound care treatments as ordered.  Evaluation of Outcomes: Not Progressing   LCSW Treatment Plan for Primary Diagnosis: Depression Long Term Goal(s): Safe transition to appropriate next level of care at discharge, Engage patient in therapeutic group addressing interpersonal concerns.  Short Term Goals: Engage patient in aftercare planning with referrals and resources, Increase social support, Increase ability to appropriately verbalize feelings, Increase emotional regulation, Facilitate acceptance of mental health diagnosis and concerns, Facilitate patient progression through stages of change regarding substance use diagnoses and concerns, Identify triggers associated with mental health/substance abuse issues, and Increase skills for wellness and recovery  Therapeutic Interventions: Assess for all discharge needs, 1 to 1 time with Social worker, Explore available resources and support systems, Assess for adequacy in community support network, Educate family and significant other(s) on suicide prevention,  Complete Psychosocial Assessment, Interpersonal group therapy.  Evaluation of Outcomes: Not Progressing   Progress in Treatment: Attending groups: Yes. Participating in groups: Yes. Taking medication as prescribed: Yes. Toleration medication: Yes. Family/Significant other contact made: No, will contact:  consents pending Patient understands diagnosis: Yes. Discussing patient identified problems/goals with staff: Yes. Medical problems stabilized or resolved: Yes. Denies suicidal/homicidal ideation: Yes. Issues/concerns per patient self-inventory: No.  New problem(s) identified: No, Describe:  none  New Short Term/Long Term Goal(s): detox, medication management for mood stabilization; elimination of SI thoughts; development of comprehensive mental wellness/sobriety plan   Patient Goals:  Get into substance use treatment  Discharge Plan or Barriers: Patient recently admitted. CSW will continue to follow and assess for appropriate referrals and possible discharge planning.    Reason for Continuation of Hospitalization: Depression Medication stabilization Other; describe Substance use   Estimated Length of Stay: 5-7 days  Last 3 [place] Suicide Severity Risk Score: Flowsheet Row [facility]Current[facility] Most recent reading at 10/13/2024  8:00 PM ED from 10/13/2024 in [facility] Most recent reading at 10/13/2024  1:35 PM ED from 07/27/2024 in [facility] Emergency Department at  [facility] Most recent reading at 07/27/2024 10:31 PM  C-SSRS RISK CATEGORY Moderate Risk Moderate Risk No Risk    Last PHQ 2/9 Scores:    07/11/2022   10:39 AM 08/13/2016   10:21 AM 01/13/2016   11:12 AM  Depression screen PHQ 2/9  Decreased Interest 1 0 0  Down, Depressed, Hopeless 1 0 0  PHQ - 2 Score 2 0 0    Scribe for Treatment Team: Jenkins LULLA Primer, LCSWA 10/14/2024 11:30 AM

## 2024-10-14 NOTE — Group Note (Signed)
 Date:  10/14/2024 Time:  10:48 AM  Group Topic/Focus:  Orientation:   The focus of this group is to educate the patient on the purpose and policies of crisis stabilization and provide a format to answer questions about their admission.  The group details unit policies and expectations of patients while admitted. Did a worksheet for spiritual health wellness.    Participation Level:  Active  Participation Quality:  Appropriate  Affect:  Appropriate  Cognitive:  Alert and Appropriate  Insight: Appropriate and Good  Engagement in Group:  Engaged  Modes of Intervention:  Orientation  Additional Comments:  Patient attended Group and participated in the assessment.  Taylie Helder M Shavawn Stobaugh 10/14/2024, 10:48 AM

## 2024-10-14 NOTE — Group Note (Addendum)
 Recreation Therapy Group Note   Group Topic:Problem Solving  Group Date: 10/14/2024 Start Time: 0945 End Time: 1005 Facilitators: Samara Stankowski-McCall, LRT,CTRS Location: 300 [address] Dayroom   Group Topic: Problem Solving  Goal Area(s) Addresses:  Patient will effectively work in a team with other group members. Patient will verbalize importance of using appropriate problem solving techniques.  Patient will identify positive change associated with effective problem solving skills.   Behavioral Response: Engaged  Intervention: Worksheet  Activity: Dentist. Patients were given a sheet front and back of brain teasers. Patients worked together (or alone) to figure out each puzzle presented.     Education: Problem solving as an important skill in daily life  Education Outcome: Acknowledges understanding/In group clarification offered/Needs additional education.    Affect/Mood: Appropriate   Participation Level: Engaged   Participation Quality: Independent   Behavior: Appropriate   Speech/Thought Process: Focused   Insight: Good   Judgement: Good   Modes of Intervention: Group work   Patient Response to Interventions:  Engaged   Education Outcome:  In group clarification offered    Clinical Observations/Individualized Feedback: Pt was bright and attentive. Pt was focused on the suggestions of peers regarding some the teasers but pt would also make suggestions as well. Pt was called out of group and didn't return.     Plan: Continue to engage patient in RT group sessions 2-3x/week.   Pheobe Sandiford-McCall, LRT,CTRS 10/14/2024 12:51 PM

## 2024-10-14 NOTE — Plan of Care (Signed)
  Problem: Education: Goal: Knowledge of [facility] Education information/materials will improve Outcome: Progressing Goal: Mental status will improve Outcome: Progressing   

## 2024-10-14 NOTE — BHH Group Notes (Signed)
 BHH Group Notes:  (Nursing/MHT/Case Management/Adjunct)  Date:  10/14/2024  Time:  2000  Type of Therapy:  Narcotics Anonymous Meeting  Participation Level:  Active  Participation Quality:  Appropriate, Attentive, Sharing, and Supportive  Affect:  Appropriate  Cognitive:  Alert  Insight:  Improving  Engagement in Group:  Engaged  Modes of Intervention:  Clarification, Education, and Support  Summary of Progress/Problems:  Anella, Nakata 10/14/2024, 9:30 PM

## 2024-10-14 NOTE — Progress Notes (Signed)

## 2024-10-14 NOTE — Progress Notes (Signed)
(  Sleep Hours) -7.25 (Any PRNs that were needed, meds refused, or side effects to meds)- prn hydroxyzine  and trazodone  @ 2113 (Any disturbances and when (visitation, over night)-none (Concerns raised by the patient)- none (SI/HI/AVH)- denies all

## 2024-10-14 NOTE — Progress Notes (Signed)
 Psychiatric Admission Assessment Adult  Patient Identification: Katie Doyle MRN:  [id number] Date of Evaluation:  10/14/2024 Chief Complaint:  Depression [F32.A] Principal Diagnosis: Cocaine use disorder, severe, dependence (HCC) Diagnosis:  Principal Problem:   Cocaine use disorder, severe, dependence (HCC) Active Problems:   HIV disease (HCC)   Substance induced mood disorder (HCC)    History of Present Illness: The patient is a 58 year old female with a history of cocaine abuse, no prior psychiatric admissions.  She presented to the behavioral health urgent care accompanied by her daughter requesting substance use treatment.  She is admitted to the [facility] on a voluntary basis.  Pertinent medical problems include HIV and recent syphilis.  The patient is pleasant and has a linear and logical thought process.  She becomes tearful at several points during the interview when discussing the deaths of her mother and brother.  The patient reports frequent use of crack cocaine, saying I use it all day every day.  She feels her use is motivated by depression caused by the loss of her mother in May of this year and the loss of her brother 1 year before that.  She reports symptoms of depression as follows: Difficulty with sleep initiation and maintenance, decreased energy, lack of enjoyment in typical activities.  She denies experiencing any thoughts of self-harm or suicide.  She does not have symptoms of mania that have occurred outside of episodes of cocaine use.  There are no auditory hallucinations or paranoia that can be elicited.  She does report some generalized anxiety.  She denies experiencing any panic attacks.  She does report regular use of cannabis.  She denies any regular use of alcohol.  She says that she wants to stop using cocaine to improve her mental wellbeing.  She wants to go to residential rehab, a 28-day program.  The patient denies having any prior  psychiatric admissions.  She denies previous self-harm or suicide attempts.  She denies access to firearms at her mother's home or at her daughter's home.  Deferring collateral contact with the patient's daughter, Charlyne, until tomorrow.  [phone].  Total Time spent with patient: 35 min  Past Psychiatric History: as above  Is the patient at risk to self? yes Has the patient been a risk to self in the past 6 months? yes Has the patient been a risk to self within the distant past? no Is the patient a risk to others? no Has the patient been a risk to others in the past 6 months? no Has the patient been a risk to others within the distant past? no  Columbia Scale:  Flowsheet Row [facility]Current[facility] Most recent reading at 10/13/2024  8:00 PM ED from 10/13/2024 in [facility] Most recent reading at 10/13/2024  1:35 PM ED from 07/27/2024 in [facility] Most recent reading at 07/27/2024 10:31 PM  C-SSRS RISK CATEGORY Moderate Risk Moderate Risk No Risk     Prior Inpatient Therapy: yes Prior Outpatient Therapy: yes  Alcohol Screening:  Patient refused Alcohol Screening Tool: Yes 1. How often do you have a drink containing alcohol?: Monthly or less 2. How many drinks containing alcohol do you have on a typical day when you are drinking?: 3 or 4 3. How often do you have six or more drinks on one occasion?: Never AUDIT-C Score: 2 4. How often during the last year have you found  that you were not able to stop drinking once you had started?: Never 5. How often during the last year have you failed to do what was normally expected from you because of drinking?: Never 6. How often during the last year have you needed a first drink in the morning to get yourself going after a heavy drinking session?: Never 7. How often during the last year have you had a  feeling of guilt of remorse after drinking?: Never 8. How often during the last year have you been unable to remember what happened the night before because you had been drinking?: Never 9. Have you or someone else been injured as a result of your drinking?: No 10. Has a relative or friend or a doctor or another health worker been concerned about your drinking or suggested you cut down?: No Alcohol Use Disorder Identification Test Final Score (AUDIT): 2 Alcohol Brief Interventions/Follow-up: Patient Refused Substance Abuse History in the last 12 months:  yes Consequences of Substance Abuse: negative Previous Psychotropic Medications: yes Psychological Evaluations: yes Past Medical History:  Past Medical History:  Diagnosis Date   Allergy    Anemia    Anxiety    Asthma    rarely uses inhaler   Chronic pain in left shoulder 2013   post shoulder reduction   CVA (cerebral infarction)    ? pt unsure of date   Depression    Fibroids    HIV (human immunodeficiency virus infection) (HCC) 09-2014   Hypertension    Schizophrenia (HCC)    SVD (spontaneous vaginal delivery) 1984   x 1    Past Surgical History:  Procedure Laterality Date   ABDOMINAL HYSTERECTOMY N/A 05/14/2013   Procedure: TOTAL ABDOMINAL HYSTERECTOMY;  Surgeon: Lynwood KANDICE Solomons, MD;  Location: [facility];  Service: Gynecology;  Laterality: N/A;   APPENDECTOMY     CESAREAN SECTION  1992   x 1   CYSTO N/A 05/14/2013   Procedure: CYSTO;  Surgeon: Lynwood KANDICE Solomons, MD;  Location: [facility];  Service: Gynecology;  Laterality: N/A;   INCISION AND DRAINAGE ABSCESS N/A 05/23/2013   Procedure: INCISION AND DRAINAGE OF ABDOMINAL ABSCESS;  Surgeon: Lynwood KANDICE Solomons, MD;  Location: [facility];  Service: Gynecology;  Laterality: N/A;   SALPINGOOPHORECTOMY Bilateral 05/14/2013   Procedure: SALPINGO OOPHORECTOMY;  Surgeon: Lynwood KANDICE Solomons, MD;  Location: [facility];  Service: Gynecology;  Laterality: Bilateral;   WISDOM TOOTH EXTRACTION     Family History:   Family History  Problem Relation Age of Onset   Diabetes Mother    Hypertension Mother    Cancer Father        colon ~91 yo   Diabetes Brother    HIV Brother    Family Psychiatric  History: none pertinent Tobacco Screening:  Social History   Tobacco Use  Smoking Status Every Day   Current packs/day: 0.00   Average packs/day: 0.3 packs/day for 20.0 years (5.0 ttl pk-yrs)   Types: Cigarettes   Start date: 09/10/1996   Last attempt to quit: 09/10/2016   Years since quitting: 8.0  Smokeless Tobacco Never    [facility] Tobacco Counseling     Are you interested in Tobacco Cessation Medications?  N/A, patient does not use tobacco products Counseled patient on smoking cessation:  N/A, patient does not use tobacco products Reason Tobacco Screening Not Completed: Patient Refused Screening       Social History:  Social History   Substance and Sexual Activity  Alcohol Use Yes  Alcohol/week: 1.0 standard drink of alcohol   Types: 1 Glasses of wine per week   Comment: occasionally     Social History   Substance and Sexual Activity  Drug Use Yes   Types: Marijuana, Crack cocaine    Additional Social History:                           Allergies:   Allergies  Allergen Reactions   Penicillins Itching, Rash and Other (See Comments)    Pt tolerated  Zosyn  during 05/22/13 admission, Has patient had a PCN reaction causing immediate rash, facial/tongue/throat swelling, SOB or lightheadedness with hypotension: Yes Has patient had a PCN reaction causing severe rash involving mucus membranes or skin necrosis: No Has patient had a PCN reaction that required hospitalization Yes Has patient had a PCN reaction occurring within the last 10 years: No If all of the above answers are NO, then may proceed with Cephalosporin use.    Lab Results:  Results for orders placed or performed during the hospital encounter of 10/13/24 (from the past 48 hours)  CBC with Differential/Platelet      Status: None   Collection Time: 10/13/24  1:18 PM  Result Value Ref Range   WBC 6.0 4.0 - 10.5 K/uL   RBC 4.21 3.87 - 5.11 MIL/uL   Hemoglobin 12.6 12.0 - 15.0 g/dL   HCT 60.9 63.9 - 53.9 %   MCV 92.6 80.0 - 100.0 fL   MCH 29.9 26.0 - 34.0 pg   MCHC 32.3 30.0 - 36.0 g/dL   RDW 85.8 88.4 - 84.4 %   Platelets 262 150 - 400 K/uL   nRBC 0.0 0.0 - 0.2 %   Neutrophils Relative % 68 %   Neutro Abs 4.1 1.7 - 7.7 K/uL   Lymphocytes Relative 22 %   Lymphs Abs 1.3 0.7 - 4.0 K/uL   Monocytes Relative 6 %   Monocytes Absolute 0.4 0.1 - 1.0 K/uL   Eosinophils Relative 2 %   Eosinophils Absolute 0.1 0.0 - 0.5 K/uL   Basophils Relative 1 %   Basophils Absolute 0.0 0.0 - 0.1 K/uL   Immature Granulocytes 1 %   Abs Immature Granulocytes 0.03 0.00 - 0.07 K/uL    Comment: Performed at [facility], [address] [place], [place] [address]  Comprehensive metabolic panel     Status: None   Collection Time: 10/13/24  1:18 PM  Result Value Ref Range   Sodium 139 135 - 145 mmol/L   Potassium 4.2 3.5 - 5.1 mmol/L   Chloride 100 98 - 111 mmol/L   CO2 27 22 - 32 mmol/L   Glucose, Bld 88 70 - 99 mg/dL    Comment: Glucose reference range applies only to samples taken after fasting for at least 8 hours.   BUN 14 6 - 20 mg/dL   Creatinine, Ser 9.22 0.44 - 1.00 mg/dL   Calcium 9.0 8.9 - 89.6 mg/dL   Total Protein 7.4 6.5 - 8.1 g/dL   Albumin 3.5 3.5 - 5.0 g/dL   AST 16 15 - 41 U/L   ALT 12 0 - 44 U/L   Alkaline Phosphatase 97 38 - 126 U/L   Total Bilirubin 0.2 0.0 - 1.2 mg/dL   GFR, Estimated >39 >39 mL/min    Comment: (NOTE) Calculated using the CKD-EPI Creatinine Equation (2021)    Anion gap 12 5 - 15    Comment: Performed at [facility], [address], [place], [place] [address]  Hemoglobin A1c     Status: None   Collection Time: 10/13/24  1:18 PM  Result Value Ref Range   Hgb A1c MFr Bld 5.6 4.8 - 5.6 %    Comment: (NOTE) Diagnosis of Diabetes The following HbA1c ranges  recommended by the American Diabetes Association (ADA) may be used as an aid in the diagnosis of diabetes mellitus.  Hemoglobin             Suggested A1C NGSP%              Diagnosis  <5.7                   Non Diabetic  5.7-6.4                Pre-Diabetic  >6.4                   Diabetic  <7.0                   Glycemic control for                       adults with diabetes.     Mean Plasma Glucose 114.02 mg/dL    Comment: Performed at [facility], [address], [place], [place] [address]  Ethanol     Status: None   Collection Time: 10/13/24  1:18 PM  Result Value Ref Range   Alcohol, Ethyl (B) <15 <15 mg/dL    Comment: (NOTE) For medical purposes only. Performed at [facility], [address] [place], [place] [address]   Lipid panel     Status: Abnormal   Collection Time: 10/13/24  1:18 PM  Result Value Ref Range   Cholesterol 217 (H) 0 - 200 mg/dL   Triglycerides 50 <849 mg/dL   HDL 75 >59 mg/dL   Total CHOL/HDL Ratio 2.9 RATIO   VLDL 10 0 - 40 mg/dL   LDL Cholesterol 867 (H) 0 - 99 mg/dL    Comment:        Total Cholesterol/HDL:CHD Risk Coronary Heart Disease Risk Table                     Men   Women  1/2 Average Risk   3.4   3.3  Average Risk       5.0   4.4  2 X Average Risk   9.6   7.1  3 X Average Risk  23.4   11.0        Use the calculated Patient Ratio above and the CHD Risk Table to determine the patient's CHD Risk.        ATP III CLASSIFICATION (LDL):  <100     mg/dL   Optimal  899-870  mg/dL   Near or Above                    Optimal  130-159  mg/dL   Borderline  839-810  mg/dL   High  >809     mg/dL   Very High Performed at [facility], [address], [place], [place] [address]   TSH     Status: None   Collection Time: 10/13/24  1:18 PM  Result Value Ref Range   TSH 0.427 0.350 - 4.500 uIU/mL    Comment: Performed by a 3rd Generation assay with  a functional sensitivity of <=0.01 uIU/mL. Performed at [facility], [address] [place], [place] [address]   Hepatitis panel, acute     Status: None   Collection Time: 10/13/24  1:18 PM  Result Value Ref Range   Hepatitis B Surface Ag NON REACTIVE NON REACTIVE   HCV Ab NON REACTIVE NON REACTIVE    Comment: (NOTE) Nonreactive HCV antibody screen is consistent with no HCV infections,  unless recent infection is suspected or other evidence exists to indicate HCV infection.     Hep A IgM NON REACTIVE NON REACTIVE   Hep B C IgM NON REACTIVE NON REACTIVE    Comment: Performed at [facility], [address] [place], [place] [address]  Urinalysis, Routine w reflex microscopic -Urine, Random     Status: None   Collection Time: 10/13/24  1:21 PM  Result Value Ref Range   Color, Urine YELLOW YELLOW   APPearance CLEAR CLEAR   Specific Gravity, Urine 1.017 1.005 - 1.030   pH 6.0 5.0 - 8.0   Glucose, UA NEGATIVE NEGATIVE mg/dL   Hgb urine dipstick NEGATIVE NEGATIVE   Bilirubin Urine NEGATIVE NEGATIVE   Ketones, ur NEGATIVE NEGATIVE mg/dL   Protein, ur NEGATIVE NEGATIVE mg/dL   Nitrite NEGATIVE NEGATIVE   Leukocytes,Ua NEGATIVE NEGATIVE    Comment: Performed at [facility], [address], [place], [place] [address]  POC urine preg, ED     Status: None   Collection Time: 10/13/24  1:24 PM  Result Value Ref Range   Preg Test, Ur Negative Negative  POCT Urine Drug Screen - (I-Screen)     Status: Abnormal   Collection Time: 10/13/24  1:24 PM  Result Value Ref Range   POC Amphetamine UR None Detected NONE DETECTED (Cut Off Level 1000 ng/mL)   POC Secobarbital (BAR) None Detected NONE DETECTED (Cut Off Level 300 ng/mL)   POC Buprenorphine (BUP) None Detected NONE DETECTED (Cut Off Level 10 ng/mL)   POC Oxazepam (BZO) None Detected NONE DETECTED (Cut Off Level 300 ng/mL)   POC Cocaine UR Positive (A) NONE DETECTED (Cut Off Level 300 ng/mL)   POC Methamphetamine UR None Detected NONE DETECTED (Cut Off Level 1000 ng/mL)   POC Morphine   None Detected NONE DETECTED (Cut Off Level 300 ng/mL)   POC Methadone UR None Detected NONE DETECTED (Cut Off Level 300 ng/mL)   POC Oxycodone  UR None Detected NONE DETECTED (Cut Off Level 100 ng/mL)   POC Marijuana UR Positive (A) NONE DETECTED (Cut Off Level 50 ng/mL)    Blood Alcohol level:  Lab Results  Component Value Date   [facility] <15 10/13/2024   ETH <10 11/22/2018    Metabolic Disorder Labs:  Lab Results  Component Value Date   HGBA1C 5.6 10/13/2024   MPG 114.02 10/13/2024   No results found for: PROLACTIN Lab Results  Component Value Date   CHOL 217 (H) 10/13/2024   TRIG 50 10/13/2024   HDL 75 10/13/2024   CHOLHDL 2.9 10/13/2024   VLDL 10 10/13/2024   LDLCALC 132 (H) 10/13/2024   LDLCALC 116 (H) 09/14/2024    Current Medications: Current Facility-Administered Medications  Medication Dose Route Frequency Provider Last Rate Last Admin   acetaminophen  (TYLENOL ) tablet 650 mg  650 mg Oral Q6H PRN White, Patrice L, NP       albuterol  (VENTOLIN  HFA) 108 (90 Base) MCG/ACT inhaler 2 puff  2 puff Inhalation Q6H PRN White, Patrice L, NP  bictegravir-emtricitabine-tenofovir AF (BIKTARVY ) 50-200-25 MG per tablet 1 tablet  1 tablet Oral Daily White, Patrice L, NP   1 tablet at 10/14/24 0841   haloperidol (HALDOL) tablet 5 mg  5 mg Oral TID PRN White, Patrice L, NP       And   diphenhydrAMINE  (BENADRYL ) capsule 50 mg  50 mg Oral TID PRN White, Patrice L, NP       haloperidol lactate (HALDOL) injection 5 mg  5 mg Intramuscular TID PRN White, Patrice L, NP       And   diphenhydrAMINE  (BENADRYL ) injection 50 mg  50 mg Intramuscular TID PRN White, Patrice L, NP       And   LORazepam  (ATIVAN ) injection 2 mg  2 mg Intramuscular TID PRN White, Patrice L, NP       haloperidol lactate (HALDOL) injection 10 mg  10 mg Intramuscular TID PRN White, Patrice L, NP       And   diphenhydrAMINE  (BENADRYL ) injection 50 mg  50 mg Intramuscular TID PRN White, Patrice L, NP       And    LORazepam  (ATIVAN ) injection 2 mg  2 mg Intramuscular TID PRN White, Patrice L, NP       doxycycline (VIBRA-TABS) tablet 100 mg  100 mg Oral BID White, Patrice L, NP   100 mg at 10/14/24 0840   hydrOXYzine  (ATARAX ) tablet 25 mg  25 mg Oral TID PRN White, Patrice L, NP   25 mg at 10/13/24 2113   lisinopril  (ZESTRIL ) tablet 20 mg  20 mg Oral Daily White, Patrice L, NP   20 mg at 10/14/24 0840   nicotine polacrilex (NICORETTE) gum 2 mg  2 mg Oral PRN Prentis Kitchens A, DO   2 mg at 10/14/24 9156   traZODone  (DESYREL ) tablet 50 mg  50 mg Oral QHS PRN White, Patrice L, NP   50 mg at 10/13/24 2113   PTA Medications: Medications Prior to Admission  Medication Sig Dispense Refill Last Dose/Taking   albuterol  (VENTOLIN  HFA) 108 (90 Base) MCG/ACT inhaler Inhale 2 puffs into the lungs every 6 (six) hours as needed for wheezing or shortness of breath. (Patient not taking: Reported on 10/13/2024)      bictegravir-emtricitabine-tenofovir AF (BIKTARVY ) 50-200-25 MG TABS tablet Take 1 tablet by mouth daily. (Patient not taking: Reported on 10/13/2024) 30 tablet 4    doxycycline (VIBRA-TABS) 100 MG tablet Take 1 tablet (100 mg total) by mouth 2 (two) times daily for 28 days. (Patient not taking: Reported on 10/13/2024) 56 tablet 0    lisinopril  (ZESTRIL ) 20 MG tablet Take 1 tablet (20 mg total) by mouth daily. 30 tablet 5     Musculoskeletal: Strength & Muscle Tone: normal Gait & Station: normal Patient leans: NA   Psychiatric Specialty Exam: Physical Exam Constitutional:      Appearance: the patient is not toxic-appearing.  Pulmonary:     Effort: Pulmonary effort is normal.  Neurological:     General: No focal deficit present.     Mental Status: the patient is alert and oriented to person, place, and time.   Review of Systems  Respiratory:  Negative for shortness of breath.   Cardiovascular:  Negative for chest pain.  Gastrointestinal:  Negative for abdominal pain, constipation, diarrhea, nausea  and vomiting.  Neurological:  Negative for headaches.      BP (!) 151/81   Pulse 75   Temp 98.4 F (36.9 C) (Oral)   Ht 5' 5 (1.651 m)  Wt 85.3 kg   LMP 01/19/2013   SpO2 100%   BMI 31.28 kg/m   General Appearance: Fairly Groomed  Eye Contact:  Good  Speech:  Clear and Coherent  Volume:  Normal  Mood: Down  Affect:  Congruent  Thought Process:  Coherent  Orientation:  Full (Time, Place, and Person)  Thought Content: Logical   Suicidal Thoughts:  No  Homicidal Thoughts:  No  Memory:  Immediate;   Good  Judgement:  fair  Insight:  fair  Psychomotor Activity:  Normal  Concentration:  Concentration: Good  Recall:  Good  Fund of Knowledge: Good  Language: Good  Akathisia:  No  Handed:  not assessed  AIMS (if indicated): not done  Assets:  Communication Skills Desire for Improvement Financial Resources/Insurance Housing Leisure Time Physical Health  ADL's:  Intact  Cognition: WNL  Sleep:  Fair    Treatment Plan Summary: Daily contact with patient to assess and evaluate symptoms and progress in treatment and Medication management  Physician Treatment Plan for Primary Diagnosis: Cocaine use disorder, severe, dependence (HCC) Long Term Goal(s): Improvement in symptoms so as ready for discharge  Short Term Goals: Ability to identify changes in lifestyle to reduce recurrence of condition will improve  Physician Treatment Plan for Secondary Diagnosis: Principal Problem:   Cocaine use disorder, severe, dependence (HCC) Active Problems:   HIV disease (HCC)   Substance induced mood disorder (HCC)   Long Term Goal(s): Improvement in symptoms so as ready for discharge  Short Term Goals: Ability to verbalize feelings will improve   PLAN: Safety and Monitoring:  -- Voluntary admission to inpatient psychiatric unit for safety, stabilization and treatment  -- Daily contact with patient to assess and evaluate symptoms and progress in treatment  -- Patient's case  to be discussed in multi-disciplinary team meeting  -- Observation Level : q15 minute checks  -- Vital signs:  q12 hours  -- Precautions: suicide, elopement, and assault  2. Psychiatric Diagnoses and Treatment:  Start Zoloft 25 mg daily, increase to 50 mg after 2 days (no prior antidepressant medication) Residential rehab placement, likely [facility] or [facility]     3. Medical Issues Being Addressed:  Patient with recent syphilis infection, continue Doxy 100 mg twice daily for 28 days History of HIV, continue Biktarvy      4. Discharge Planning:  -- Social work and case management to assist with discharge planning and identification of hospital follow-up needs prior to discharge -- Estimated LOS: 5-7 days    I certify that inpatient services furnished can reasonably be expected to improve the patient's condition.    Karleen Kaufmann, MD PGY-4

## 2024-10-15 DIAGNOSIS — B20 Human immunodeficiency virus [HIV] disease: Secondary | ICD-10-CM

## 2024-10-15 DIAGNOSIS — F1994 Other psychoactive substance use, unspecified with psychoactive substance-induced mood disorder: Secondary | ICD-10-CM

## 2024-10-15 DIAGNOSIS — F1420 Cocaine dependence, uncomplicated: Secondary | ICD-10-CM

## 2024-10-15 MED ORDER — SERTRALINE HCL 50 MG PO TABS
50.0000 mg | ORAL_TABLET | Freq: Every day | ORAL | Status: DC
  Administered 2024-10-16 – 2024-10-19 (×4): 50 mg via ORAL
  Filled 2024-10-15 (×4): qty 1

## 2024-10-15 NOTE — Progress Notes (Signed)
 [facility] MD Progress Note  10/15/2024 2:16 PM Katie Doyle  MRN:  [id number]  Principal Problem: Cocaine use disorder, severe, dependence (HCC) Diagnosis: Principal Problem:   Cocaine use disorder, severe, dependence (HCC) Active Problems:   HIV disease (HCC)   Substance induced mood disorder (HCC)    Reason for Admission:  The patient is a 58 year old female with a history of cocaine abuse, no prior psychiatric admissions. She presented to the behavioral health urgent care accompanied by her daughter requesting substance use treatment. She is admitted to the [facility] on a voluntary basis. Pertinent medical problems include HIV and recent syphilis.    Information obtained from 24-hour nursing report: No remarkable events   Information Obtained Today During Patient Interview:  The patient is cheerful today.  She says that she has talked things over with her daughter and still wants to pursue residential rehab.  (LCSW aware and will make referrals today).  She reports good sleep and appetite.  She denies experiencing any thoughts of self-harm.  She agrees to increasing her dose of Zoloft.  Total Time spent with patient: 20 min  Past Psychiatric History: as per H and P  Past Medical History:  Past Medical History:  Diagnosis Date   Allergy    Anemia    Anxiety    Asthma    rarely uses inhaler   Chronic pain in left shoulder 2013   post shoulder reduction   CVA (cerebral infarction)    ? pt unsure of date   Depression    Fibroids    HIV (human immunodeficiency virus infection) (HCC) 09-2014   Hypertension    Schizophrenia (HCC)    SVD (spontaneous vaginal delivery) 1984   x 1    Past Surgical History:  Procedure Laterality Date   ABDOMINAL HYSTERECTOMY N/A 05/14/2013   Procedure: TOTAL ABDOMINAL HYSTERECTOMY;  Surgeon: Lynwood KANDICE Solomons, MD;  Location: [facility];  Service: Gynecology;  Laterality: N/A;   APPENDECTOMY     CESAREAN SECTION  1992   x 1    CYSTO N/A 05/14/2013   Procedure: CYSTO;  Surgeon: Lynwood KANDICE Solomons, MD;  Location: [facility];  Service: Gynecology;  Laterality: N/A;   INCISION AND DRAINAGE ABSCESS N/A 05/23/2013   Procedure: INCISION AND DRAINAGE OF ABDOMINAL ABSCESS;  Surgeon: Lynwood KANDICE Solomons, MD;  Location: [facility];  Service: Gynecology;  Laterality: N/A;   SALPINGOOPHORECTOMY Bilateral 05/14/2013   Procedure: SALPINGO OOPHORECTOMY;  Surgeon: Lynwood KANDICE Solomons, MD;  Location: [facility];  Service: Gynecology;  Laterality: Bilateral;   WISDOM TOOTH EXTRACTION     Family History:  Family History  Problem Relation Age of Onset   Diabetes Mother    Hypertension Mother    Cancer Father        colon ~72 yo   Diabetes Brother    HIV Brother    Family Psychiatric  History: as per H and P Social History:  Social History   Substance and Sexual Activity  Alcohol Use Yes   Alcohol/week: 1.0 standard drink of alcohol   Types: 1 Glasses of wine per week   Comment: occasionally     Social History   Substance and Sexual Activity  Drug Use Yes   Types: Marijuana, Crack cocaine    Social History   Socioeconomic History   Marital status: Divorced    Spouse name: Not on file   Number of children: Not on file   Years of education: Not on file   Highest education  level: Not on file  Occupational History   Not on file  Tobacco Use   Smoking status: Every Day    Current packs/day: 0.00    Average packs/day: 0.3 packs/day for 20.0 years (5.0 ttl pk-yrs)    Types: Cigarettes    Start date: 09/10/1996    Last attempt to quit: 09/10/2016    Years since quitting: 8.1   Smokeless tobacco: Never  Vaping Use   Vaping status: Never Used  Substance and Sexual Activity   Alcohol use: Yes    Alcohol/week: 1.0 standard drink of alcohol    Types: 1 Glasses of wine per week    Comment: occasionally   Drug use: Yes    Types: Marijuana, Crack cocaine   Sexual activity: Yes    Partners: Male    Birth control/protection: None, Condom   Other Topics Concern   Not on file  Social History Narrative   Not on file   Social Drivers of Health   Financial Resource Strain: Not on file  Food Insecurity: Food Insecurity Present (10/13/2024)   Hunger Vital Sign    Worried About Running Out of Food in the Last Year: Sometimes true    Ran Out of Food in the Last Year: Sometimes true  Transportation Needs: Unmet Transportation Needs (10/13/2024)   PRAPARE - Administrator, Civil Service (Medical): Yes    Lack of Transportation (Non-Medical): Yes  Physical Activity: Not on file  Stress: Not on file  Social Connections: Not on file   Additional Social History:                         Sleep: fair  Appetite: fair  Current Medications: Current Facility-Administered Medications  Medication Dose Route Frequency Provider Last Rate Last Admin   acetaminophen  (TYLENOL ) tablet 650 mg  650 mg Oral Q6H PRN White, Patrice L, NP       albuterol  (VENTOLIN  HFA) 108 (90 Base) MCG/ACT inhaler 2 puff  2 puff Inhalation Q6H PRN White, Patrice L, NP       bictegravir-emtricitabine-tenofovir AF (BIKTARVY ) 50-200-25 MG per tablet 1 tablet  1 tablet Oral Daily White, Patrice L, NP   1 tablet at 10/15/24 1021   haloperidol (HALDOL) tablet 5 mg  5 mg Oral TID PRN White, Patrice L, NP       And   diphenhydrAMINE  (BENADRYL ) capsule 50 mg  50 mg Oral TID PRN White, Patrice L, NP       haloperidol lactate (HALDOL) injection 5 mg  5 mg Intramuscular TID PRN White, Patrice L, NP       And   diphenhydrAMINE  (BENADRYL ) injection 50 mg  50 mg Intramuscular TID PRN White, Patrice L, NP       And   LORazepam  (ATIVAN ) injection 2 mg  2 mg Intramuscular TID PRN White, Patrice L, NP       haloperidol lactate (HALDOL) injection 10 mg  10 mg Intramuscular TID PRN White, Patrice L, NP       And   diphenhydrAMINE  (BENADRYL ) injection 50 mg  50 mg Intramuscular TID PRN White, Patrice L, NP       And   LORazepam  (ATIVAN ) injection 2 mg  2 mg  Intramuscular TID PRN White, Patrice L, NP       doxycycline (VIBRA-TABS) tablet 100 mg  100 mg Oral BID White, Patrice L, NP   100 mg at 10/15/24 0834   hydrOXYzine  (ATARAX ) tablet 25  mg  25 mg Oral TID PRN White, Patrice L, NP   25 mg at 10/14/24 2119   lisinopril  (ZESTRIL ) tablet 20 mg  20 mg Oral Daily White, Patrice L, NP   20 mg at 10/15/24 0834   nicotine polacrilex (NICORETTE) gum 2 mg  2 mg Oral PRN Prentis Kitchens A, DO   2 mg at 10/14/24 9156   sertraline (ZOLOFT) tablet 25 mg  25 mg Oral Daily Marry Clamp, MD   25 mg at 10/15/24 9165   traZODone  (DESYREL ) tablet 50 mg  50 mg Oral QHS PRN White, Patrice L, NP   50 mg at 10/14/24 2119    Lab Results:  No results found for this or any previous visit (from the past 48 hours).  Blood Alcohol level:  Lab Results  Component Value Date   [facility] <15 10/13/2024   ETH <10 11/22/2018    Metabolic Disorder Labs: Lab Results  Component Value Date   HGBA1C 5.6 10/13/2024   MPG 114.02 10/13/2024   No results found for: PROLACTIN Lab Results  Component Value Date   CHOL 217 (H) 10/13/2024   TRIG 50 10/13/2024   HDL 75 10/13/2024   CHOLHDL 2.9 10/13/2024   VLDL 10 10/13/2024   LDLCALC 132 (H) 10/13/2024   LDLCALC 116 (H) 09/14/2024    Physical Findings:   Psychiatric Specialty Exam: Physical Exam Constitutional:      Appearance: the patient is not toxic-appearing.  Pulmonary:     Effort: Pulmonary effort is normal.  Neurological:     General: No focal deficit present.     Mental Status: the patient is alert and oriented to person, place, and time.   Review of Systems  Respiratory:  Negative for shortness of breath.   Cardiovascular:  Negative for chest pain.  Gastrointestinal:  Negative for abdominal pain, constipation, diarrhea, nausea and vomiting.  Neurological:  Negative for headaches.      BP (!) 153/98 (BP Location: Left Arm)   Pulse 92   Temp 98.2 F (36.8 C) (Oral)   Resp 16   Ht 5' 5 (1.651 m)   Wt  85.3 kg   LMP 01/19/2013   SpO2 98%   BMI 31.28 kg/m   General Appearance: Fairly Groomed  Eye Contact:  Good  Speech:  Clear and Coherent  Volume:  Normal  Mood:  Euthymic  Affect:  Congruent  Thought Process:  Coherent  Orientation:  Full (Time, Place, and Person)  Thought Content: Logical   Suicidal Thoughts:  No  Homicidal Thoughts:  No  Memory:  Immediate;   Good  Judgement:  fair  Insight:  fair  Psychomotor Activity:  Normal  Concentration:  Concentration: Good  Recall:  Good  Fund of Knowledge: Good  Language: Good  Akathisia:  No  Handed:  not assessed  AIMS (if indicated): not done  Assets:  Communication Skills Desire for Improvement Financial Resources/Insurance Housing Leisure Time Physical Health  ADL's:  Intact  Cognition: WNL  Sleep:  Fair      Treatment Plan Summary: Daily contact with patient to assess and evaluate symptoms and progress in treatment and Medication management   PLAN: Safety and Monitoring:             -- Voluntary admission to inpatient psychiatric unit for safety, stabilization and treatment             -- Daily contact with patient to assess and evaluate symptoms and progress in treatment             --  Patient's case to be discussed in multi-disciplinary team meeting             -- Observation Level : q15 minute checks             -- Vital signs:  q12 hours             -- Precautions: suicide, elopement, and assault   2. Psychiatric Diagnoses and Treatment:  Increase Zoloft from 25 mg daily to 50 mg daily Residential rehab placement, likely [facility] or [facility]                  3. Medical Issues Being Addressed:  Patient with recent syphilis infection, continue Doxy 100 mg twice daily for 28 days History of HIV, continue Biktarvy          4. Discharge Planning:  -- Social work and case management to assist with discharge planning and identification of hospital follow-up needs prior to discharge -- Estimated LOS: 5-7  days  Karleen Kaufmann, MD PGY-4

## 2024-10-15 NOTE — Group Note (Signed)
 Date:  10/15/2024 Time:  9:26 AM  Group Topic/Focus:  Goals Group:   The focus of this group is to help patients establish daily goals to achieve during treatment and discuss how the patient can incorporate goal setting into their daily lives to aide in recovery.    Participation Level:  Did Not Attend  Participation Quality:  Did Not Attend  Affect:  Did Not Attend  Cognitive:  Did Not Attend  Insight: None  Engagement in Group:  Did Not Attend  Modes of Intervention:  Did Not Attend  Additional Comments:  Did Not Attend  Zakya Halabi A Cindy Brindisi 10/15/2024, 9:26 AM

## 2024-10-15 NOTE — Group Note (Signed)
 LCSW Group Therapy Note   Group Date: 10/15/2024 Start Time: 1100 End Time: 1200   Participation:  patient was present.  She listened and was respectful but didn't participate in the discussion.  Type of Therapy:  Group Therapy  Topic:  Stress Less:  Nurturing Your Mind and Body Through Calm   Objective:  Learn techniques for managing stress through body relaxation, mindfulness, and self-compassion.  Goals: Use body relaxation techniques, such as Box Breathing and Progressive Muscle Relaxation, to reduce physical tension. Practice mindfulness to break the cycle of overthinking and mental chatter. Embrace self-compassion to handle stress with kindness and resilience.  Summary:  Today's session focused on calming the body with relaxation techniques, breaking the cycle of stress with mindfulness, and using self-compassion to manage challenges more gracefully. These tools help reduce stress and foster a balanced, peaceful mindset.  Therapeutic Modalities used:  Elements of CBT ( cognitive restructuring)  Elements of DBT (box breathing, progressive body relaxation, mindfulness, acceptance)    Katie Doyle O Ronal Maybury, LCSWA 10/15/2024  12:17 PM

## 2024-10-15 NOTE — Group Note (Signed)
 Date:  10/15/2024 Time:  2:24 PM  Group Topic/Focus:  Emotional Education:   The focus of this group is to discuss what feelings/emotions are, and how they are experienced.    Participation Level:  Did Not Attend  Participation Quality:  Did Not Attend  Affect:  Did Not Attend  Cognitive:  Did Not Attend  Insight: None  Engagement in Group:  Did Not Attend  Modes of Intervention:  Did Not Attend  Additional Comments:  Did Not Attend  Katie Doyle [name] 10/15/2024, 2:24 PM

## 2024-10-15 NOTE — H&P (Signed)
 Psychiatric Admission Assessment Adult   Patient Identification: Katie Doyle MRN:  [id number] Date of Evaluation:  10/14/2024 Chief Complaint:  Depression [F32.A] Principal Diagnosis: Cocaine use disorder, severe, dependence (HCC) Diagnosis:  Principal Problem:   Cocaine use disorder, severe, dependence (HCC) Active Problems:   HIV disease (HCC)   Substance induced mood disorder (HCC)       History of Present Illness: The patient is a 58 year old female with a history of cocaine abuse, no prior psychiatric admissions.  She presented to the behavioral health urgent care accompanied by her daughter requesting substance use treatment.  She is admitted to the [facility] on a voluntary basis.  Pertinent medical problems include HIV and recent syphilis.   The patient is pleasant and has a linear and logical thought process.  She becomes tearful at several points during the interview when discussing the deaths of her mother and brother.   The patient reports frequent use of crack cocaine, saying I use it all day every day.  She feels her use is motivated by depression caused by the loss of her mother in May of this year and the loss of her brother 1 year before that.  She reports symptoms of depression as follows: Difficulty with sleep initiation and maintenance, decreased energy, lack of enjoyment in typical activities.  She denies experiencing any thoughts of self-harm or suicide.  She does not have symptoms of mania that have occurred outside of episodes of cocaine use.  There are no auditory hallucinations or paranoia that can be elicited.  She does report some generalized anxiety.  She denies experiencing any panic attacks.  She does report regular use of cannabis.  She denies any regular use of alcohol.  She says that she wants to stop using cocaine to improve her mental wellbeing.  She wants to go to residential rehab, a 28-day program.   The patient denies having any  prior psychiatric admissions.  She denies previous self-harm or suicide attempts.  She denies access to firearms at her mother's home or at her daughter's home.   Deferring collateral contact with the patient's daughter, Charlyne, until tomorrow.  [phone].   Total Time spent with patient: 35 min   Past Psychiatric History: as above   Is the patient at risk to self? yes Has the patient been a risk to self in the past 6 months? yes Has the patient been a risk to self within the distant past? no Is the patient a risk to others? no Has the patient been a risk to others in the past 6 months? no Has the patient been a risk to others within the distant past? no   Columbia Scale:  Flowsheet Row [facility]Current[facility] Most recent reading at 10/13/2024  8:00 PM ED from 10/13/2024 in [facility] Most recent reading at 10/13/2024  1:35 PM ED from 07/27/2024 in [facility] Department at [facility] Most recent reading at 07/27/2024 10:31 PM  C-SSRS RISK CATEGORY Moderate Risk Moderate Risk No Risk      Prior Inpatient Therapy: yes Prior Outpatient Therapy: yes   Alcohol Screening:  Patient refused Alcohol Screening Tool: Yes 1. How often do you have a drink containing alcohol?: Monthly or less 2. How many drinks containing alcohol do you have on a typical day when you are drinking?: 3 or 4 3. How often do you have six or more drinks on one occasion?:  Never AUDIT-C Score: 2 4. How often during the last year have you found that you were not able to stop drinking once you had started?: Never 5. How often during the last year have you failed to do what was normally expected from you because of drinking?: Never 6. How often during the last year have you needed a first drink in the morning to get yourself going after a heavy drinking session?: Never 7. How often during the last year have  you had a feeling of guilt of remorse after drinking?: Never 8. How often during the last year have you been unable to remember what happened the night before because you had been drinking?: Never 9. Have you or someone else been injured as a result of your drinking?: No 10. Has a relative or friend or a doctor or another health worker been concerned about your drinking or suggested you cut down?: No Alcohol Use Disorder Identification Test Final Score (AUDIT): 2 Alcohol Brief Interventions/Follow-up: Patient Refused Substance Abuse History in the last 12 months:  yes Consequences of Substance Abuse: negative Previous Psychotropic Medications: yes Psychological Evaluations: yes Past Medical History:      Past Medical History:  Diagnosis Date   Allergy     Anemia     Anxiety     Asthma      rarely uses inhaler   Chronic pain in left shoulder 2013    post shoulder reduction   CVA (cerebral infarction)      ? pt unsure of date   Depression     Fibroids     HIV (human immunodeficiency virus infection) (HCC) 09-2014   Hypertension     Schizophrenia (HCC)     SVD (spontaneous vaginal delivery) 1984    x 1             Past Surgical History:  Procedure Laterality Date   ABDOMINAL HYSTERECTOMY N/A 05/14/2013    Procedure: TOTAL ABDOMINAL HYSTERECTOMY;  Surgeon: Lynwood KANDICE Solomons, MD;  Location: [facility];  Service: Gynecology;  Laterality: N/A;   APPENDECTOMY       CESAREAN SECTION   1992    x 1   CYSTO N/A 05/14/2013    Procedure: CYSTO;  Surgeon: Lynwood KANDICE Solomons, MD;  Location: [facility];  Service: Gynecology;  Laterality: N/A;   INCISION AND DRAINAGE ABSCESS N/A 05/23/2013    Procedure: INCISION AND DRAINAGE OF ABDOMINAL ABSCESS;  Surgeon: Lynwood KANDICE Solomons, MD;  Location: [facility];  Service: Gynecology;  Laterality: N/A;   SALPINGOOPHORECTOMY Bilateral 05/14/2013    Procedure: SALPINGO OOPHORECTOMY;  Surgeon: Lynwood KANDICE Solomons, MD;  Location: [facility];  Service: Gynecology;  Laterality: Bilateral;    WISDOM TOOTH EXTRACTION            Family History:       Family History  Problem Relation Age of Onset   Diabetes Mother     Hypertension Mother     Cancer Father          colon ~66 yo   Diabetes Brother     HIV Brother          Family Psychiatric  History: none pertinent Tobacco Screening:  Tobacco Use History  Social History        Tobacco Use  Smoking Status Every Day   Current packs/day: 0.00   Average packs/day: 0.3 packs/day for 20.0 years (5.0 ttl pk-yrs)   Types: Cigarettes   Start date: 09/10/1996   Last attempt to quit:  09/10/2016   Years since quitting: 8.0  Smokeless Tobacco Never      BH Tobacco Counseling       Are you interested in Tobacco Cessation Medications?  N/A, patient does not use tobacco products Counseled patient on smoking cessation:  N/A, patient does not use tobacco products Reason Tobacco Screening Not Completed: Patient Refused Screening           Social History:  Social History        Substance and Sexual Activity  Alcohol Use Yes   Alcohol/week: 1.0 standard drink of alcohol   Types: 1 Glasses of wine per week    Comment: occasionally     Social History        Substance and Sexual Activity  Drug Use Yes   Types: Marijuana, Crack cocaine    Additional Social History:      Allergies:   Allergies       Allergies  Allergen Reactions   Penicillins Itching, Rash and Other (See Comments)      Pt tolerated  Zosyn  during 05/22/13 admission, Has patient had a PCN reaction causing immediate rash, facial/tongue/throat swelling, SOB or lightheadedness with hypotension: Yes Has patient had a PCN reaction causing severe rash involving mucus membranes or skin necrosis: No Has patient had a PCN reaction that required hospitalization Yes Has patient had a PCN reaction occurring within the last 10 years: No If all of the above answers are NO, then may proceed with Cephalosporin use.        Lab Results:  Lab Results Last 48  Hours        Results for orders placed or performed during the hospital encounter of 10/13/24 (from the past 48 hours)  CBC with Differential/Platelet     Status: None    Collection Time: 10/13/24  1:18 PM  Result Value Ref Range    WBC 6.0 4.0 - 10.5 K/uL    RBC 4.21 3.87 - 5.11 MIL/uL    Hemoglobin 12.6 12.0 - 15.0 g/dL    HCT 60.9 63.9 - 53.9 %    MCV 92.6 80.0 - 100.0 fL    MCH 29.9 26.0 - 34.0 pg    MCHC 32.3 30.0 - 36.0 g/dL    RDW 85.8 88.4 - 84.4 %    Platelets 262 150 - 400 K/uL    nRBC 0.0 0.0 - 0.2 %    Neutrophils Relative % 68 %    Neutro Abs 4.1 1.7 - 7.7 K/uL    Lymphocytes Relative 22 %    Lymphs Abs 1.3 0.7 - 4.0 K/uL    Monocytes Relative 6 %    Monocytes Absolute 0.4 0.1 - 1.0 K/uL    Eosinophils Relative 2 %    Eosinophils Absolute 0.1 0.0 - 0.5 K/uL    Basophils Relative 1 %    Basophils Absolute 0.0 0.0 - 0.1 K/uL    Immature Granulocytes 1 %    Abs Immature Granulocytes 0.03 0.00 - 0.07 K/uL      Comment: Performed at [facility], [address], [place], [place] [address]  Comprehensive metabolic panel     Status: None    Collection Time: 10/13/24  1:18 PM  Result Value Ref Range    Sodium 139 135 - 145 mmol/L    Potassium 4.2 3.5 - 5.1 mmol/L    Chloride 100 98 - 111 mmol/L    CO2 27 22 - 32 mmol/L    Glucose, Bld 88 70 -  99 mg/dL      Comment: Glucose reference range applies only to samples taken after fasting for at least 8 hours.    BUN 14 6 - 20 mg/dL    Creatinine, Ser 9.22 0.44 - 1.00 mg/dL    Calcium 9.0 8.9 - 89.6 mg/dL    Total Protein 7.4 6.5 - 8.1 g/dL    Albumin 3.5 3.5 - 5.0 g/dL    AST 16 15 - 41 U/L    ALT 12 0 - 44 U/L    Alkaline Phosphatase 97 38 - 126 U/L    Total Bilirubin 0.2 0.0 - 1.2 mg/dL    GFR, Estimated >39 >39 mL/min      Comment: (NOTE) Calculated using the CKD-EPI Creatinine Equation (2021)      Anion gap 12 5 - 15      Comment: Performed at [facility], [address], [place], [place] [address]   Hemoglobin A1c     Status: None    Collection Time: 10/13/24  1:18 PM  Result Value Ref Range    Hgb A1c MFr Bld 5.6 4.8 - 5.6 %      Comment: (NOTE) Diagnosis of Diabetes The following HbA1c ranges recommended by the American Diabetes Association (ADA) may be used as an aid in the diagnosis of diabetes mellitus.   Hemoglobin             Suggested A1C NGSP%              Diagnosis   <5.7                   Non Diabetic   5.7-6.4                Pre-Diabetic   >6.4                   Diabetic   <7.0                   Glycemic control for                       adults with diabetes.        Mean Plasma Glucose 114.02 mg/dL      Comment: Performed at [facility], [address], [place], [place] [address]  Ethanol     Status: None    Collection Time: 10/13/24  1:18 PM  Result Value Ref Range    Alcohol, Ethyl (B) <15 <15 mg/dL      Comment: (NOTE) For medical purposes only. Performed at [facility], [address], [place], [place] [address]    Lipid panel     Status: Abnormal    Collection Time: 10/13/24  1:18 PM  Result Value Ref Range    Cholesterol 217 (H) 0 - 200 mg/dL    Triglycerides 50 <849 mg/dL    HDL 75 >59 mg/dL    Total CHOL/HDL Ratio 2.9 RATIO    VLDL 10 0 - 40 mg/dL    LDL Cholesterol 867 (H) 0 - 99 mg/dL      Comment:        Total Cholesterol/HDL:CHD Risk Coronary Heart Disease Risk Table                     Men   Women  1/2 Average Risk   3.4   3.3  Average Risk  5.0   4.4  2 X Average Risk   9.6   7.1  3 X Average Risk  23.4   11.0        Use the calculated Patient Ratio above and the CHD Risk Table to determine the patient's CHD Risk.        ATP III CLASSIFICATION (LDL):  <100     mg/dL   Optimal  899-870  mg/dL   Near or Above                    Optimal  130-159  mg/dL   Borderline  839-810  mg/dL   High  >809     mg/dL   Very High Performed at [facility], [address], [place], [place] [address]    TSH      Status: None    Collection Time: 10/13/24  1:18 PM  Result Value Ref Range    TSH 0.427 0.350 - 4.500 uIU/mL      Comment: Performed by a 3rd Generation assay with a functional sensitivity of <=0.01 uIU/mL. Performed at [facility], [address], [place], [place] [address]    Hepatitis panel, acute     Status: None    Collection Time: 10/13/24  1:18 PM  Result Value Ref Range    Hepatitis B Surface Ag NON REACTIVE NON REACTIVE    HCV Ab NON REACTIVE NON REACTIVE      Comment: (NOTE) Nonreactive HCV antibody screen is consistent with no HCV infections,  unless recent infection is suspected or other evidence exists to indicate HCV infection.        Hep A IgM NON REACTIVE NON REACTIVE    Hep B C IgM NON REACTIVE NON REACTIVE      Comment: Performed at [facility], [address], [place], [place] [address]  Urinalysis, Routine w reflex microscopic -Urine, Random     Status: None    Collection Time: 10/13/24  1:21 PM  Result Value Ref Range    Color, Urine YELLOW YELLOW    APPearance CLEAR CLEAR    Specific Gravity, Urine 1.017 1.005 - 1.030    pH 6.0 5.0 - 8.0    Glucose, UA NEGATIVE NEGATIVE mg/dL    Hgb urine dipstick NEGATIVE NEGATIVE    Bilirubin Urine NEGATIVE NEGATIVE    Ketones, ur NEGATIVE NEGATIVE mg/dL    Protein, ur NEGATIVE NEGATIVE mg/dL    Nitrite NEGATIVE NEGATIVE    Leukocytes,Ua NEGATIVE NEGATIVE      Comment: Performed at [facility], [address], [place], [place] [address]  POC urine preg, ED     Status: None    Collection Time: 10/13/24  1:24 PM  Result Value Ref Range    Preg Test, Ur Negative Negative  POCT Urine Drug Screen - (I-Screen)     Status: Abnormal    Collection Time: 10/13/24  1:24 PM  Result Value Ref Range    POC Amphetamine UR None Detected NONE DETECTED (Cut Off Level 1000 ng/mL)    POC Secobarbital (BAR) None Detected NONE DETECTED (Cut Off Level 300 ng/mL)    POC Buprenorphine (BUP) None Detected NONE DETECTED  (Cut Off Level 10 ng/mL)    POC Oxazepam (BZO) None Detected NONE DETECTED (Cut Off Level 300 ng/mL)    POC Cocaine UR Positive (A) NONE DETECTED (Cut Off Level 300 ng/mL)    POC Methamphetamine UR None Detected NONE DETECTED (Cut  Off Level 1000 ng/mL)    POC Morphine  None Detected NONE DETECTED (Cut Off Level 300 ng/mL)    POC Methadone UR None Detected NONE DETECTED (Cut Off Level 300 ng/mL)    POC Oxycodone  UR None Detected NONE DETECTED (Cut Off Level 100 ng/mL)    POC Marijuana UR Positive (A) NONE DETECTED (Cut Off Level 50 ng/mL)        Blood Alcohol level:  Recent Labs       Lab Results  Component Value Date    [facility] <15 10/13/2024    ETH <10 11/22/2018        Metabolic Disorder Labs:  Recent Labs       Lab Results  Component Value Date    HGBA1C 5.6 10/13/2024    MPG 114.02 10/13/2024      Recent Labs  No results found for: PROLACTIN   Recent Labs       Lab Results  Component Value Date    CHOL 217 (H) 10/13/2024    TRIG 50 10/13/2024    HDL 75 10/13/2024    CHOLHDL 2.9 10/13/2024    VLDL 10 10/13/2024    LDLCALC 132 (H) 10/13/2024    LDLCALC 116 (H) 09/14/2024        Current Medications:          Current Facility-Administered Medications  Medication Dose Route Frequency Provider Last Rate Last Admin   acetaminophen  (TYLENOL ) tablet 650 mg  650 mg Oral Q6H PRN White, Patrice L, NP       albuterol  (VENTOLIN  HFA) 108 (90 Base) MCG/ACT inhaler 2 puff  2 puff Inhalation Q6H PRN White, Patrice L, NP       bictegravir-emtricitabine-tenofovir AF (BIKTARVY ) 50-200-25 MG per tablet 1 tablet  1 tablet Oral Daily White, Patrice L, NP   1 tablet at 10/14/24 0841   haloperidol (HALDOL) tablet 5 mg  5 mg Oral TID PRN White, Patrice L, NP        And   diphenhydrAMINE  (BENADRYL ) capsule 50 mg  50 mg Oral TID PRN White, Patrice L, NP       haloperidol lactate (HALDOL) injection 5 mg  5 mg Intramuscular TID PRN White, Patrice L, NP        And   diphenhydrAMINE   (BENADRYL ) injection 50 mg  50 mg Intramuscular TID PRN White, Patrice L, NP        And   LORazepam  (ATIVAN ) injection 2 mg  2 mg Intramuscular TID PRN White, Patrice L, NP       haloperidol lactate (HALDOL) injection 10 mg  10 mg Intramuscular TID PRN White, Patrice L, NP        And   diphenhydrAMINE  (BENADRYL ) injection 50 mg  50 mg Intramuscular TID PRN White, Patrice L, NP        And   LORazepam  (ATIVAN ) injection 2 mg  2 mg Intramuscular TID PRN White, Patrice L, NP       doxycycline (VIBRA-TABS) tablet 100 mg  100 mg Oral BID White, Patrice L, NP   100 mg at 10/14/24 0840   hydrOXYzine  (ATARAX ) tablet 25 mg  25 mg Oral TID PRN White, Patrice L, NP   25 mg at 10/13/24 2113   lisinopril  (ZESTRIL ) tablet 20 mg  20 mg Oral Daily White, Patrice L, NP   20 mg at 10/14/24 0840   nicotine polacrilex (NICORETTE) gum 2 mg  2 mg Oral PRN Prentis Kitchens A, DO   2 mg at 10/14/24  9156   traZODone  (DESYREL ) tablet 50 mg  50 mg Oral QHS PRN White, Patrice L, NP   50 mg at 10/13/24 2113        PTA Medications:        Medications Prior to Admission  Medication Sig Dispense Refill Last Dose/Taking   albuterol  (VENTOLIN  HFA) 108 (90 Base) MCG/ACT inhaler Inhale 2 puffs into the lungs every 6 (six) hours as needed for wheezing or shortness of breath. (Patient not taking: Reported on 10/13/2024)         bictegravir-emtricitabine-tenofovir AF (BIKTARVY ) 50-200-25 MG TABS tablet Take 1 tablet by mouth daily. (Patient not taking: Reported on 10/13/2024) 30 tablet 4     doxycycline (VIBRA-TABS) 100 MG tablet Take 1 tablet (100 mg total) by mouth 2 (two) times daily for 28 days. (Patient not taking: Reported on 10/13/2024) 56 tablet 0     lisinopril  (ZESTRIL ) 20 MG tablet Take 1 tablet (20 mg total) by mouth daily. 30 tablet 5            Musculoskeletal: Strength & Muscle Tone: normal Gait & Station: normal Patient leans: NA     Psychiatric Specialty Exam: Physical Exam Constitutional:       Appearance: the patient is not toxic-appearing.  Pulmonary:     Effort: Pulmonary effort is normal.  Neurological:     General: No focal deficit present.     Mental Status: the patient is alert and oriented to person, place, and time.    Review of Systems  Respiratory:  Negative for shortness of breath.   Cardiovascular:  Negative for chest pain.  Gastrointestinal:  Negative for abdominal pain, constipation, diarrhea, nausea and vomiting.  Neurological:  Negative for headaches.       BP (!) 151/81   Pulse 75   Temp 98.4 F (36.9 C) (Oral)   Ht 5' 5 (1.651 m)   Wt 85.3 kg   LMP 01/19/2013   SpO2 100%   BMI 31.28 kg/m   General Appearance: Fairly Groomed  Eye Contact:  Good  Speech:  Clear and Coherent  Volume:  Normal  Mood: Down  Affect:  Congruent  Thought Process:  Coherent  Orientation:  Full (Time, Place, and Person)  Thought Content: Logical   Suicidal Thoughts:  No  Homicidal Thoughts:  No  Memory:  Immediate;   Good  Judgement:  fair  Insight:  fair  Psychomotor Activity:  Normal  Concentration:  Concentration: Good  Recall:  Good  Fund of Knowledge: Good  Language: Good  Akathisia:  No  Handed:  not assessed  AIMS (if indicated): not done  Assets:  Communication Skills Desire for Improvement Financial Resources/Insurance Housing Leisure Time Physical Health  ADL's:  Intact  Cognition: WNL  Sleep:  Fair      Treatment Plan Summary: Daily contact with patient to assess and evaluate symptoms and progress in treatment and Medication management   Physician Treatment Plan for Primary Diagnosis: Cocaine use disorder, severe, dependence (HCC) Long Term Goal(s): Improvement in symptoms so as ready for discharge   Short Term Goals: Ability to identify changes in lifestyle to reduce recurrence of condition will improve   Physician Treatment Plan for Secondary Diagnosis: Principal Problem:   Cocaine use disorder, severe, dependence (HCC) Active  Problems:   HIV disease (HCC)   Substance induced mood disorder (HCC)     Long Term Goal(s): Improvement in symptoms so as ready for discharge   Short Term Goals: Ability to verbalize feelings will  improve     PLAN: Safety and Monitoring:             -- Voluntary admission to inpatient psychiatric unit for safety, stabilization and treatment             -- Daily contact with patient to assess and evaluate symptoms and progress in treatment             -- Patient's case to be discussed in multi-disciplinary team meeting             -- Observation Level : q15 minute checks             -- Vital signs:  q12 hours             -- Precautions: suicide, elopement, and assault   2. Psychiatric Diagnoses and Treatment:  Start Zoloft 25 mg daily, increase to 50 mg after 2 days (no prior antidepressant medication) Residential rehab placement, likely [facility] or [facility]                  3. Medical Issues Being Addressed:  Patient with recent syphilis infection, continue Doxy 100 mg twice daily for 28 days History of HIV, continue Biktarvy          4. Discharge Planning:  -- Social work and case management to assist with discharge planning and identification of hospital follow-up needs prior to discharge -- Estimated LOS: 5-7 days       I certify that inpatient services furnished can reasonably be expected to improve the patient's condition.     Karleen Kaufmann, MD PGY-4

## 2024-10-15 NOTE — Progress Notes (Signed)
(  Sleep Hours) -9 (Any PRNs that were needed, meds refused, or side effects to meds)- Vistaril  25 mg, Trazodone  50 mg (Any disturbances and when (visitation, over night)- none (Concerns raised by the patient)- none (SI/HI/AVH)-denies
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
## 2024-10-15 NOTE — Group Note (Signed)
 Date:  10/15/2024 Time:  9:48 PM  Group Topic/Focus:  Wrap-Up Group:   The focus of this group is to help patients review their daily goal of treatment and discuss progress on daily workbooks.    Participation Level:  Did Not Attend  Participation Quality:  Resistant  Affect:  Resistant  Cognitive:  Lacking  Insight: None  Engagement in Group:  None  Modes of Intervention:  Discussion  Additional Comments:  Patient did not attend wrap up group this evening   Bari Moats 10/15/2024, 9:48 PM

## 2024-10-15 NOTE — Plan of Care (Signed)
   Problem: Education: Goal: Emotional status will improve Outcome: Progressing Goal: Mental status will improve Outcome: Progressing   Problem: Activity: Goal: Interest or engagement in activities will improve Outcome: Progressing Goal: Sleeping patterns will improve Outcome: Progressing

## 2024-10-16 DIAGNOSIS — I10 Essential (primary) hypertension: Secondary | ICD-10-CM

## 2024-10-16 DIAGNOSIS — A539 Syphilis, unspecified: Secondary | ICD-10-CM

## 2024-10-16 MED ORDER — LISINOPRIL 20 MG PO TABS
40.0000 mg | ORAL_TABLET | Freq: Every day | ORAL | Status: DC
  Administered 2024-10-17 – 2024-10-21 (×5): 40 mg via ORAL
  Filled 2024-10-16 (×5): qty 2

## 2024-10-16 MED ORDER — LISINOPRIL 5 MG PO TABS
10.0000 mg | ORAL_TABLET | Freq: Once | ORAL | Status: AC
  Administered 2024-10-16: 10 mg via ORAL
  Filled 2024-10-16: qty 2

## 2024-10-16 MED ORDER — DIPHENHYDRAMINE HCL 12.5 MG/5ML PO ELIX: 25.0000 mg | ORAL_SOLUTION | Freq: Once | ORAL | Status: DC | PRN

## 2024-10-16 MED ORDER — HYDROXYZINE HCL 25 MG PO TABS
25.0000 mg | ORAL_TABLET | Freq: Three times a day (TID) | ORAL | Status: DC | PRN
  Administered 2024-10-16 – 2024-10-19 (×4): 25 mg via ORAL
  Filled 2024-10-16 (×4): qty 1

## 2024-10-16 MED ORDER — CLONIDINE HCL 0.1 MG PO TABS
0.1000 mg | ORAL_TABLET | Freq: Two times a day (BID) | ORAL | Status: DC | PRN
  Administered 2024-10-16 – 2024-10-20 (×3): 0.1 mg via ORAL
  Filled 2024-10-16 (×3): qty 1

## 2024-10-16 NOTE — Group Note (Signed)
 Date:  10/16/2024 Time:  12:47 PM  Group Topic/Focus:  Wellness Toolbox:   The focus of this group is to discuss various aspects of wellness, balancing those aspects and exploring ways to increase the ability to experience wellness.  Patients will create a wellness toolbox for use upon discharge.    Participation Level:  Active  Participation Quality:  Attentive  Affect:  Appropriate  Cognitive:  Alert  Insight: Good  Engagement in Group:  Engaged  Modes of Intervention:  Education  Additional Comments:    Katie Doyle 10/16/2024, 12:47 PM

## 2024-10-16 NOTE — Progress Notes (Signed)
   10/16/24 0915  Psych Admission Type (Psych Patients Only)  Admission Status Voluntary  Psychosocial Assessment  Patient Complaints Anxiety;Depression  Eye Contact Fair  Facial Expression Animated;Worried  Affect Anxious  Surveyor, Minerals Activity Slow  Appearance/Hygiene Unremarkable  Behavior Characteristics Cooperative  Mood Anxious;Pleasant  Thought Process  Coherency WDL  Content WDL  Delusions None reported or observed  Perception WDL  Hallucination None reported or observed  Judgment WDL  Confusion WDL  Danger to Self  Current suicidal ideation? Denies  Agreement Not to Harm Self Yes  Description of Agreement Verbal  Danger to Others  Danger to Others None reported or observed

## 2024-10-16 NOTE — Group Note (Addendum)
 Date:  10/16/2024 Time:  6:55 PM  Group Topic/Focus: 5 love languages Identifying Needs:   The focus of this group is to help patients identify their personal needs that have been historically problematic and identify healthy behaviors to address their needs.  Participation Level:  Active   Participation Quality:  Appropriate   Affect:  Appropriate   Cognitive:  Appropriate   Insight: Appropriate   Engagement in Group:  Engaged   Modes of Intervention:  Discussion and Education   Additional Comments:    Juliene CHRISTELLA Huddle 10/16/2024, 6:55 PM

## 2024-10-16 NOTE — Group Note (Unsigned)
 Date:  10/16/2024 Time:  9:24 PM  Group Topic/Focus:  Relapse Prevention Planning:   The focus of this group is to define relapse and discuss the need for planning to combat relapse. Wrap-Up Group:   The focus of this group is to help patients review their daily goal of treatment and discuss progress on daily workbooks.     Participation Level:  {BHH PARTICIPATION OZCZO:77735}  Participation Quality:  {BHH PARTICIPATION QUALITY:22265}  Affect:  {BHH AFFECT:22266}  Cognitive:  {BHH COGNITIVE:22267}  Insight: {BHH Insight2:20797}  Engagement in Group:  {BHH ENGAGEMENT IN HMNLE:77731}  Modes of Intervention:  {BHH MODES OF INTERVENTION:22269}  Additional Comments:  ***  Wakhaila H Shaelee Forni 10/16/2024, 9:24 PM

## 2024-10-16 NOTE — Group Note (Signed)
 Date:  10/16/2024 Time:  10:28 AM  Group Topic/Focus:  Goals Group:   The focus of this group is to help patients establish daily goals to achieve during treatment and discuss how the patient can incorporate goal setting into their daily lives to aide in recovery.    Participation Level:  Did Not Attend  Participation Quality:  Did Not Attend  Affect:  Did Not Attend  Cognitive:  Did Not Attend  Insight: None  Engagement in Group:  Did Not Attend  Modes of Intervention:  Did Not Attend  Additional Comments:  Did Not Attend  Katie Doyle 10/16/2024, 10:28 AM

## 2024-10-16 NOTE — Progress Notes (Signed)
 CONTACT NOTE:  CSW provided substance use tx list for patient and instructed patient to call [facility] for a phone screening this afternoon.   SIGNED: Lynton Crescenzo Nunez-Uva, LCSW-A

## 2024-10-16 NOTE — Plan of Care (Signed)
   Problem: Education: Goal: Knowledge of [facility] Education information/materials will improve Outcome: Progressing Goal: Emotional status will improve Outcome: Progressing Goal: Mental status will improve Outcome: Progressing

## 2024-10-16 NOTE — Group Note (Signed)
 Recreation Therapy Group Note   Group Topic:Problem Solving  Group Date: 10/16/2024 Start Time: 0934 End Time: 1000 Facilitators: Zuleica Seith-McCall, LRT,CTRS Location: [address] Dayroom   Group Topic: Halloween Trivia  Goal Area(s) Addresses:  Patient will effectively work in a team with other group members. Patient will verbalize importance of using appropriate problem solving techniques.   Behavioral Response:   Intervention: Trivia  Activity: Set Designer. Patients worked together in teams. LRT read questions from 6 different categories (Classic Horror, Monster Movies, Slasher Flicks, Behind-the-Scenes, Caution! Plot and Beware the Grab Bag). Each group was to right down their answers. LRT would go over the answers with patients at the end of each category. The team with the most combined points (point total from each category) wins the game.    Education: Journalist, Newspaper, Team Building, Communication  Education Outcome: Acknowledges understanding/In group clarification offered/Needs additional education.    Affect/Mood: N/A   Participation Level: Did not attend    Clinical Observations/Individualized Feedback:     Plan: Continue to engage patient in RT group sessions 2-3x/week.   Brenisha Tsui-McCall, LRT,CTRS 10/16/2024 11:55 AM

## 2024-10-16 NOTE — Progress Notes (Signed)
 [facility] Inpatient Psychiatry Progress Note  Date: 10/16/24 Patient: Katie Doyle MRN: [id number]  Assessment and Plan: The patient is a 58 year old female with a history of cocaine abuse and no prior psychiatric admissions. She presented to the behavioral health urgent care accompanied by her daughter requesting substance use treatment.   # Cocaine use disorder, severe, dependence (HCC) # Substance induced mood disorder - Sertraline 50 mg daily - Seeking SUD placement  # HTN - Increase lisinopril  to 40 mg daily - Clonidine 0.1 mg BID prn HTN - Consider starting HCTZ  # Syphilis - Doxycycline 100 mg BID for 28 days (started 10/28)  Risk Assessment - Intermediate  Discharge Planning Estimated length of stay: 3-7 days Predicted Discharge location: Inpatient SUD     Interval History and update: No significant events overnight. Patient has been attending some groups. She is visible in the milieu and demonstrating appropriate behaviors. Denies SI today, but endorses a continuing depressed mood. She is taking and tolerating sertraline well without any side effects. She continues to reach out to SUD programs and remains motivated to receive treatment. She is pending an acceptance from [facility] or [facility].       Physical Exam MSK/Neuro - Normal gait and station  Mental Status Exam Appearance - Casually dressed, appropriate hygiene and grooming, lying in bed Attitude - Calm, polite, not guarded Speech - normal volume, prosody, inflection Mood - Tired Affect - Mildly restricted Thought Process - LLGD Thought Content - No delusional TC expressed SI/HI - Denies  Perceptions - Denies AVH; not RIS Judgement/Insight - Good  Fund of knowledge - WNL Language - No impairments      Lab Results:  No visits with results within 1 Day(s) from this visit.  Latest known visit with results is:  Admission on 10/13/2024, Discharged on 10/13/2024  Component Date Value  Ref Range Status   WBC 10/13/2024 6.0  4.0 - 10.5 K/uL Final   RBC 10/13/2024 4.21  3.87 - 5.11 MIL/uL Final   Hemoglobin 10/13/2024 12.6  12.0 - 15.0 g/dL Final   HCT 89/71/7974 39.0  36.0 - 46.0 % Final   MCV 10/13/2024 92.6  80.0 - 100.0 fL Final   MCH 10/13/2024 29.9  26.0 - 34.0 pg Final   MCHC 10/13/2024 32.3  30.0 - 36.0 g/dL Final   RDW 89/71/7974 14.1  11.5 - 15.5 % Final   Platelets 10/13/2024 262  150 - 400 K/uL Final   nRBC 10/13/2024 0.0  0.0 - 0.2 % Final   Neutrophils Relative % 10/13/2024 68  % Final   Neutro Abs 10/13/2024 4.1  1.7 - 7.7 K/uL Final   Lymphocytes Relative 10/13/2024 22  % Final   Lymphs Abs 10/13/2024 1.3  0.7 - 4.0 K/uL Final   Monocytes Relative 10/13/2024 6  % Final   Monocytes Absolute 10/13/2024 0.4  0.1 - 1.0 K/uL Final   Eosinophils Relative 10/13/2024 2  % Final   Eosinophils Absolute 10/13/2024 0.1  0.0 - 0.5 K/uL Final   Basophils Relative 10/13/2024 1  % Final   Basophils Absolute 10/13/2024 0.0  0.0 - 0.1 K/uL Final   Immature Granulocytes 10/13/2024 1  % Final   Abs Immature Granulocytes 10/13/2024 0.03  0.00 - 0.07 K/uL Final   Sodium 10/13/2024 139  135 - 145 mmol/L Final   Potassium 10/13/2024 4.2  3.5 - 5.1 mmol/L Final   Chloride 10/13/2024 100  98 - 111 mmol/L Final   CO2 10/13/2024 27  22 -  32 mmol/L Final   Glucose, Bld 10/13/2024 88  70 - 99 mg/dL Final   BUN 89/71/7974 14  6 - 20 mg/dL Final   Creatinine, Ser 10/13/2024 0.77  0.44 - 1.00 mg/dL Final   Calcium 89/71/7974 9.0  8.9 - 10.3 mg/dL Final   Total Protein 89/71/7974 7.4  6.5 - 8.1 g/dL Final   Albumin 89/71/7974 3.5  3.5 - 5.0 g/dL Final   AST 89/71/7974 16  15 - 41 U/L Final   ALT 10/13/2024 12  0 - 44 U/L Final   Alkaline Phosphatase 10/13/2024 97  38 - 126 U/L Final   Total Bilirubin 10/13/2024 0.2  0.0 - 1.2 mg/dL Final   GFR, Estimated 10/13/2024 >60  >60 mL/min Final   Anion gap 10/13/2024 12  5 - 15 Final   Hgb A1c MFr Bld 10/13/2024 5.6  4.8 - 5.6 % Final    Mean Plasma Glucose 10/13/2024 114.02  mg/dL Final   Alcohol, Ethyl (B) 10/13/2024 <15  <15 mg/dL Final   Cholesterol 89/71/7974 217 (H)  0 - 200 mg/dL Final   Triglycerides 89/71/7974 50  <150 mg/dL Final   HDL 89/71/7974 75  >40 mg/dL Final   Total CHOL/HDL Ratio 10/13/2024 2.9  RATIO Final   VLDL 10/13/2024 10  0 - 40 mg/dL Final   LDL Cholesterol 10/13/2024 132 (H)  0 - 99 mg/dL Final   TSH 89/71/7974 0.427  0.350 - 4.500 uIU/mL Final   Preg Test, Ur 10/13/2024 Negative  Negative Final   POC Amphetamine UR 10/13/2024 None Detected  NONE DETECTED (Cut Off Level 1000 ng/mL) Final   POC Secobarbital (BAR) 10/13/2024 None Detected  NONE DETECTED (Cut Off Level 300 ng/mL) Final   POC Buprenorphine (BUP) 10/13/2024 None Detected  NONE DETECTED (Cut Off Level 10 ng/mL) Final   POC Oxazepam (BZO) 10/13/2024 None Detected  NONE DETECTED (Cut Off Level 300 ng/mL) Final   POC Cocaine UR 10/13/2024 Positive (A)  NONE DETECTED (Cut Off Level 300 ng/mL) Final   POC Methamphetamine UR 10/13/2024 None Detected  NONE DETECTED (Cut Off Level 1000 ng/mL) Final   POC Morphine  10/13/2024 None Detected  NONE DETECTED (Cut Off Level 300 ng/mL) Final   POC Methadone UR 10/13/2024 None Detected  NONE DETECTED (Cut Off Level 300 ng/mL) Final   POC Oxycodone  UR 10/13/2024 None Detected  NONE DETECTED (Cut Off Level 100 ng/mL) Final   POC Marijuana UR 10/13/2024 Positive (A)  NONE DETECTED (Cut Off Level 50 ng/mL) Final   Hepatitis B Surface Ag 10/13/2024 NON REACTIVE  NON REACTIVE Final   HCV Ab 10/13/2024 NON REACTIVE  NON REACTIVE Final   Hep A IgM 10/13/2024 NON REACTIVE  NON REACTIVE Final   Hep B C IgM 10/13/2024 NON REACTIVE  NON REACTIVE Final   Color, Urine 10/13/2024 YELLOW  YELLOW Final   APPearance 10/13/2024 CLEAR  CLEAR Final   Specific Gravity, Urine 10/13/2024 1.017  1.005 - 1.030 Final   pH 10/13/2024 6.0  5.0 - 8.0 Final   Glucose, UA 10/13/2024 NEGATIVE  NEGATIVE mg/dL Final   Hgb urine  dipstick 10/13/2024 NEGATIVE  NEGATIVE Final   Bilirubin Urine 10/13/2024 NEGATIVE  NEGATIVE Final   Ketones, ur 10/13/2024 NEGATIVE  NEGATIVE mg/dL Final   Protein, ur 89/71/7974 NEGATIVE  NEGATIVE mg/dL Final   Nitrite 89/71/7974 NEGATIVE  NEGATIVE Final   Leukocytes,Ua 10/13/2024 NEGATIVE  NEGATIVE Final     Vitals: Blood pressure (!) 162/107, pulse 78, temperature 98.2 F (36.8 C), temperature  source Oral, resp. rate 16, height 5' 5 (1.651 m), weight 85.3 kg, last menstrual period 01/19/2013, SpO2 100%.    Oliva DELENA Salmon, DO

## 2024-10-16 NOTE — Plan of Care (Signed)
  Problem: Activity: Goal: Interest or engagement in activities will improve Outcome: Progressing Goal: Sleeping patterns will improve Outcome: Progressing   Problem: Coping: Goal: Ability to verbalize frustrations and anger appropriately will improve Outcome: Progressing Goal: Ability to demonstrate self-control will improve Outcome: Progressing   Problem: Safety: Goal: Periods of time without injury will increase Outcome: Progressing

## 2024-10-16 NOTE — Progress Notes (Signed)
 CONTACT [facility]  [phone]  CSW received voicemail from [facility] detailing patient's acceptance to their program. [facility] stated they do not have any appointments available for intake until week of November 10th.   SIGNED: Stephano Arrants Nunez-Uva, LCSW-A

## 2024-10-17 MED ORDER — NYSTATIN 100000 UNIT/GM EX POWD
Freq: Two times a day (BID) | CUTANEOUS | Status: DC
  Administered 2024-10-18: 1 via TOPICAL
  Filled 2024-10-17: qty 15

## 2024-10-17 MED ORDER — FLUCONAZOLE 150 MG PO TABS
150.0000 mg | ORAL_TABLET | Freq: Once | ORAL | Status: AC
  Administered 2024-10-17: 150 mg via ORAL
  Filled 2024-10-17 (×2): qty 1

## 2024-10-17 MED ORDER — ENSURE PLUS HIGH PROTEIN PO LIQD
237.0000 mL | Freq: Two times a day (BID) | ORAL | Status: DC
  Administered 2024-10-17 – 2024-10-20 (×5): 237 mL via ORAL
  Filled 2024-10-17 (×9): qty 237

## 2024-10-17 NOTE — Group Note (Signed)
 Date:  10/17/2024 Time:  3:59 PM  Group Topic/Focus:  Social Wellness  Patients engaged in a bingo game, promoting social interaction and active participation. For patients who chose not to participate in the game, a movie played in the background, allowing them to remain in a social setting without direct involvement in the activity.  The structure of the bingo game supports social wellness by facilitating communication, turn-taking, and group cohesion, while also offering a fun and engaging way to connect with peers. Additionally, the option to watch the movie allowed individuals to remain engaged in the social setting at their own comfort level, enhancing the sense of community and reducing feelings of isolation.  Participation Level:  Active  Participation Quality:  Appropriate, Attentive, and Supportive  Affect:  Appropriate  Cognitive:  Alert and Appropriate  Insight: Appropriate and Improving  Engagement in Group:  Engaged and Improving  Modes of Intervention:  Activity, Socialization, and Support  Additional Comments:  Jermiya attended and actively participated in bingo. She won numerous rounds and was supportive and kind toward a peer who had not yet won.  Kristi HERO Kass Herberger 10/17/2024, 3:59 PM

## 2024-10-17 NOTE — [facility] Group Notes (Signed)
 Tiffney attended the social work group today, 10/17/24 from 1000-1100.

## 2024-10-17 NOTE — Group Note (Signed)
 Date:  10/17/2024 Time:  6:36 PM  Group Topic/Focus:  Emotional Wellness  The group session centered on anger management and coping mechanisms, beginning with a TED Talk that provided insights on shifting perspectives and effectively managing anger. After the video, the group engaged in a discussion about the anger cycle, exploring how anger develops and the emotional and physiological responses that accompany it. Patients also examined common triggers of anger and how to identify these triggers in their own lives. The group then focused on distinguishing between healthy and unhealthy coping mechanisms, with a particular emphasis on strategies that promote emotional regulation and positive outcomes.  Throughout the discussion, patients had the opportunity to collaborate by sharing personal experiences and brainstorming a variety of coping strategies. This collective sharing allowed participants to gain new insights and refine their own approaches to managing anger. The group dynamic fostered a supportive environment where patients could learn from each other's experiences, providing both validation and constructive feedback on coping techniques. The session aimed to enhance self-awareness, promote emotional resilience, and encourage the use of adaptive coping strategies in real-life situations.  Participation Level:  Did Not Attend  Participation Quality:  N/A  Affect:  N/A  Cognitive:  N/A  Insight: None  Engagement in Group:  N/A  Modes of Intervention:  N/A  Additional Comments:  Beola did not attend this emotional wellness group.  Kristi HERO Raquan Iannone 10/17/2024, 6:36 PM

## 2024-10-17 NOTE — Progress Notes (Signed)
 Adult Psychoeducational Group Note  Date:  10/17/2024 Time:  9:37 PM  Group Topic/Focus:  Wrap-Up Group:   The focus of this group is to help patients review their daily goal of treatment and discuss progress on daily workbooks.  Participation Level:  Active  Participation Quality:  Appropriate  Affect:  Appropriate  Cognitive:  Appropriate  Insight: Appropriate  Engagement in Group:  Engaged  Modes of Intervention:  Discussion  Additional Comments:  Pt goal for the day was for her blood pressure to come down. Pt met goal.  Daine Lonita BIRCH 10/17/2024, 9:37 PM

## 2024-10-17 NOTE — Progress Notes (Signed)
 [facility] Inpatient Psychiatry Progress Note  Date: 10/17/24 Patient: Katie Doyle MRN: [id number]  Assessment and Plan: The patient is a 58 year old female with a history of cocaine abuse and no prior psychiatric admissions. She presented to the behavioral health urgent care accompanied by her daughter requesting substance use treatment.   # Cocaine use disorder, severe, dependence (HCC) # Substance induced mood disorder - Sertraline 50 mg daily - Seeking SUD placement  # HTN - Increase lisinopril  to 40 mg daily - Clonidine 0.1 mg BID prn HTN - Consider starting HCTZ  # Syphilis - Doxycycline 100 mg BID for 28 days (started 10/28)  Risk Assessment - Intermediate  Discharge Planning Estimated length of stay: 3-7 days Predicted Discharge location: Inpatient SUD     Interval History and update: No significant events overnight. Patient slept well last night. She has been visible in the milieu and participating in groups. She reported feeling well today. Denied SI. No physical complaints. She is pending a bed date for an inpatient SUD program. Awaiting any further updates on start date. She is tolerating sertraline well without side effects. No issues or concerns.       Physical Exam MSK/Neuro - Normal gait and station  Mental Status Exam Appearance - Casually dressed, appropriate hygiene and grooming Attitude - Calm, polite, not guarded Speech - normal volume, prosody, inflection Mood - Pretty good Affect - Full Thought Process - LLGD Thought Content - No delusional TC expressed SI/HI - Denies  Perceptions - Denies AVH; not RIS Judgement/Insight - Good  Fund of knowledge - WNL Language - No impairments      Lab Results:  No visits with results within 1 Day(s) from this visit.  Latest known visit with results is:  Admission on 10/13/2024, Discharged on 10/13/2024  Component Date Value Ref Range Status   WBC 10/13/2024 6.0  4.0 - 10.5 K/uL  Final   RBC 10/13/2024 4.21  3.87 - 5.11 MIL/uL Final   Hemoglobin 10/13/2024 12.6  12.0 - 15.0 g/dL Final   HCT 89/71/7974 39.0  36.0 - 46.0 % Final   MCV 10/13/2024 92.6  80.0 - 100.0 fL Final   MCH 10/13/2024 29.9  26.0 - 34.0 pg Final   MCHC 10/13/2024 32.3  30.0 - 36.0 g/dL Final   RDW 89/71/7974 14.1  11.5 - 15.5 % Final   Platelets 10/13/2024 262  150 - 400 K/uL Final   nRBC 10/13/2024 0.0  0.0 - 0.2 % Final   Neutrophils Relative % 10/13/2024 68  % Final   Neutro Abs 10/13/2024 4.1  1.7 - 7.7 K/uL Final   Lymphocytes Relative 10/13/2024 22  % Final   Lymphs Abs 10/13/2024 1.3  0.7 - 4.0 K/uL Final   Monocytes Relative 10/13/2024 6  % Final   Monocytes Absolute 10/13/2024 0.4  0.1 - 1.0 K/uL Final   Eosinophils Relative 10/13/2024 2  % Final   Eosinophils Absolute 10/13/2024 0.1  0.0 - 0.5 K/uL Final   Basophils Relative 10/13/2024 1  % Final   Basophils Absolute 10/13/2024 0.0  0.0 - 0.1 K/uL Final   Immature Granulocytes 10/13/2024 1  % Final   Abs Immature Granulocytes 10/13/2024 0.03  0.00 - 0.07 K/uL Final   Sodium 10/13/2024 139  135 - 145 mmol/L Final   Potassium 10/13/2024 4.2  3.5 - 5.1 mmol/L Final   Chloride 10/13/2024 100  98 - 111 mmol/L Final   CO2 10/13/2024 27  22 - 32 mmol/L Final  Glucose, Bld 10/13/2024 88  70 - 99 mg/dL Final   BUN 89/71/7974 14  6 - 20 mg/dL Final   Creatinine, Ser 10/13/2024 0.77  0.44 - 1.00 mg/dL Final   Calcium 89/71/7974 9.0  8.9 - 10.3 mg/dL Final   Total Protein 89/71/7974 7.4  6.5 - 8.1 g/dL Final   Albumin 89/71/7974 3.5  3.5 - 5.0 g/dL Final   AST 89/71/7974 16  15 - 41 U/L Final   ALT 10/13/2024 12  0 - 44 U/L Final   Alkaline Phosphatase 10/13/2024 97  38 - 126 U/L Final   Total Bilirubin 10/13/2024 0.2  0.0 - 1.2 mg/dL Final   GFR, Estimated 10/13/2024 >60  >60 mL/min Final   Anion gap 10/13/2024 12  5 - 15 Final   Hgb A1c MFr Bld 10/13/2024 5.6  4.8 - 5.6 % Final   Mean Plasma Glucose 10/13/2024 114.02  mg/dL Final    Alcohol, Ethyl (B) 10/13/2024 <15  <15 mg/dL Final   Cholesterol 89/71/7974 217 (H)  0 - 200 mg/dL Final   Triglycerides 89/71/7974 50  <150 mg/dL Final   HDL 89/71/7974 75  >40 mg/dL Final   Total CHOL/HDL Ratio 10/13/2024 2.9  RATIO Final   VLDL 10/13/2024 10  0 - 40 mg/dL Final   LDL Cholesterol 10/13/2024 132 (H)  0 - 99 mg/dL Final   TSH 89/71/7974 0.427  0.350 - 4.500 uIU/mL Final   Preg Test, Ur 10/13/2024 Negative  Negative Final   POC Amphetamine UR 10/13/2024 None Detected  NONE DETECTED (Cut Off Level 1000 ng/mL) Final   POC Secobarbital (BAR) 10/13/2024 None Detected  NONE DETECTED (Cut Off Level 300 ng/mL) Final   POC Buprenorphine (BUP) 10/13/2024 None Detected  NONE DETECTED (Cut Off Level 10 ng/mL) Final   POC Oxazepam (BZO) 10/13/2024 None Detected  NONE DETECTED (Cut Off Level 300 ng/mL) Final   POC Cocaine UR 10/13/2024 Positive (A)  NONE DETECTED (Cut Off Level 300 ng/mL) Final   POC Methamphetamine UR 10/13/2024 None Detected  NONE DETECTED (Cut Off Level 1000 ng/mL) Final   POC Morphine  10/13/2024 None Detected  NONE DETECTED (Cut Off Level 300 ng/mL) Final   POC Methadone UR 10/13/2024 None Detected  NONE DETECTED (Cut Off Level 300 ng/mL) Final   POC Oxycodone  UR 10/13/2024 None Detected  NONE DETECTED (Cut Off Level 100 ng/mL) Final   POC Marijuana UR 10/13/2024 Positive (A)  NONE DETECTED (Cut Off Level 50 ng/mL) Final   Hepatitis B Surface Ag 10/13/2024 NON REACTIVE  NON REACTIVE Final   HCV Ab 10/13/2024 NON REACTIVE  NON REACTIVE Final   Hep A IgM 10/13/2024 NON REACTIVE  NON REACTIVE Final   Hep B C IgM 10/13/2024 NON REACTIVE  NON REACTIVE Final   Color, Urine 10/13/2024 YELLOW  YELLOW Final   APPearance 10/13/2024 CLEAR  CLEAR Final   Specific Gravity, Urine 10/13/2024 1.017  1.005 - 1.030 Final   pH 10/13/2024 6.0  5.0 - 8.0 Final   Glucose, UA 10/13/2024 NEGATIVE  NEGATIVE mg/dL Final   Hgb urine dipstick 10/13/2024 NEGATIVE  NEGATIVE Final   Bilirubin  Urine 10/13/2024 NEGATIVE  NEGATIVE Final   Ketones, ur 10/13/2024 NEGATIVE  NEGATIVE mg/dL Final   Protein, ur 89/71/7974 NEGATIVE  NEGATIVE mg/dL Final   Nitrite 89/71/7974 NEGATIVE  NEGATIVE Final   Leukocytes,Ua 10/13/2024 NEGATIVE  NEGATIVE Final     Vitals: Blood pressure 128/87, pulse 80, temperature (!) 97.5 F (36.4 C), temperature source Oral, resp. rate 16,  height 5' 5 (1.651 m), weight 85.3 kg, last menstrual period 01/19/2013, SpO2 98%.    Oliva DELENA Salmon, DO

## 2024-10-17 NOTE — Progress Notes (Signed)
(  Sleep Hours) -7.0 (Any PRNs that were needed, meds refused, or side effects to meds)- prn hydroxyzine , benadryl , and trazodone  @ 2131 (Any disturbances and when (visitation, over night)-none (Concerns raised by the patient)-itching. Pt thinks she has a yeast infection d/t taking antibiotics. Pt states all antibiotics make her itch (SI/HI/AVH)- Denies all

## 2024-10-17 NOTE — Group Note (Signed)
 [facility] LCSW Group Therapy Note   Group Date: 10/17/2024 Start Time: 1000 End Time: 1100  Participation Level:  Active   Type of Therapy/Topic:  Group Therapy:  Sleep Hygiene and Healthy Waking Habits  Description of Group:    This group will address the concept of Sleep Hygiene and Healthy Waking Habits and how it feels and looks when one is unbalanced. Patients will be encouraged to process areas in their lives that are out of balance and identify reasons for remaining unbalanced. Facilitators will guide patients utilizing problem- solving interventions to address and correct the stressor making their Sleep Hygiene and Waking Habit unbalanced. Understanding and applying new Sleep Hygiene techniques will be explored and addressed for obtaining and maintaining a balanced life.   Therapeutic Goals: Patient will identify Healthy Habits to maintain good Sleep Hygiene and Waking routine.  Patient will demonstrate ability to communicate their needs through discussion.  Summary of Patient Progress: Pt present the entire group and participated in discussion. Latonda talked about needing to be cool to sleep due my hot flashes and coffee in the morning.    Therapeutic Modalities:   Cognitive Behavioral Therapy Solution-Focused Therapy    Camelia Olden, LCSW

## 2024-10-17 NOTE — Progress Notes (Addendum)
 D. Pt has been appropriate during interactions- reported sleeping well last night, described her appetite and concentration as 'good', and energy level as 'normal'. Per pt's self inventory, pt rated her depression,hopelessness and anxiety a 5/0/5, respectively. Pt's stated goal today is to work on grief, by focusing and praying.. Pt has been visible in the milieu, observed attending groups. Pt currently denies SI/HI and AVH and doesn't appear to be responding to internal stimuli.  A. Labs and vitals monitored. Pt given and educated on medications. Pt supported emotionally and encouraged to express concerns and ask questions.   R. Pt remains safe with 15 minute checks. Will continue POC.    10/17/24 1000  Psych Admission Type (Psych Patients Only)  Admission Status Voluntary  Psychosocial Assessment  Patient Complaints None  Eye Contact Fair  Facial Expression Animated  Affect Anxious  Speech Logical/coherent  Interaction Assertive  Motor Activity Slow  Appearance/Hygiene Unremarkable  Behavior Characteristics Cooperative;Appropriate to situation  Mood Pleasant  Thought Process  Coherency WDL  Content WDL  Delusions None reported or observed  Perception WDL  Hallucination None reported or observed  Judgment WDL  Confusion WDL  Danger to Self  Current suicidal ideation? Denies  Danger to Others  Danger to Others None reported or observed

## 2024-10-17 NOTE — Plan of Care (Signed)
   Problem: Education: Goal: Emotional status will improve Outcome: Progressing Goal: Mental status will improve Outcome: Progressing   Problem: Activity: Goal: Interest or engagement in activities will improve Outcome: Progressing

## 2024-10-17 NOTE — Group Note (Signed)
 Date:  10/17/2024 Time:  9:11 AM  Group Topic/Focus:  Goals Group:   The focus of this group is to help patients establish daily goals to achieve during treatment and discuss how the patient can incorporate goal setting into their daily lives to aide in recovery.    Participation Level:  Did Not Attend  Participation Quality:  N/A  Affect:  N/A  Cognitive:  N/A  Insight: None  Engagement in Group:  None  Modes of Intervention:  N/A  Additional Comments:  Ansley did not attend goals group.  Kristi HERO Shalicia Craghead 10/17/2024, 9:11 AM

## 2024-10-17 NOTE — Plan of Care (Signed)
   Problem: Education: Goal: Knowledge of [facility] Education information/materials will improve Outcome: Progressing Goal: Emotional status will improve Outcome: Progressing Goal: Mental status will improve Outcome: Progressing

## 2024-10-17 NOTE — Group Note (Signed)
 Date:  10/17/2024 Time:  5:06 PM  Group Topic/Focus:  Self Care:   The focus of this group is to help patients understand the importance of self-care in order to improve or restore emotional, physical, spiritual, interpersonal, and financial health.    Participation Level:  Active  Participation Quality:  Appropriate  Affect:  Appropriate  Cognitive:  Appropriate  Insight: Appropriate  Engagement in Group:  Engaged  Modes of Intervention:  Discussion and Education  Additional Comments:    Juliene CHRISTELLA Huddle 10/17/2024, 5:06 PM

## 2024-10-18 MED ORDER — AMLODIPINE BESYLATE 5 MG PO TABS
5.0000 mg | ORAL_TABLET | Freq: Every day | ORAL | Status: DC
  Administered 2024-10-18 – 2024-10-21 (×4): 5 mg via ORAL
  Filled 2024-10-18 (×4): qty 1

## 2024-10-18 MED ORDER — NICOTINE 14 MG/24HR TD PT24
14.0000 mg | MEDICATED_PATCH | Freq: Every day | TRANSDERMAL | Status: DC
  Administered 2024-10-18: 14 mg via TRANSDERMAL
  Filled 2024-10-18 (×2): qty 1

## 2024-10-18 MED ORDER — NICOTINE 14 MG/24HR TD PT24
MEDICATED_PATCH | TRANSDERMAL | Status: AC
  Filled 2024-10-18: qty 1

## 2024-10-18 NOTE — Progress Notes (Signed)
(  Sleep Hours) -8.5 (Any PRNs that were needed, meds refused, or side effects to meds)- prn hydroxyzine  and trazodone  @ 2132 (Any disturbances and when (visitation, over night)-none (Concerns raised by the patient)- none (SI/HI/AVH)- denies all

## 2024-10-18 NOTE — Progress Notes (Addendum)
 D. Pt has been appropriate during interactions, reported sleeping well last night, described her appetite and concentration as 'good' and her energy level as 'normal'. Per pt's self inventory, pt rated her depression,hopelessness and anxiety a 3/0/3, respectively. Pt's stated goal today is to work on dealing withy grief and depression. Pt currently denies SI/HI and AVH and and doesn't appear to be responding to internal stimuli. A. Labs and vitals monitored. Pt given and educated on medications. Pt supported emotionally and encouraged to express concerns and ask questions.   R. Pt remains safe with 15 minute checks. Will continue POC.    10/18/24 1100  Psych Admission Type (Psych Patients Only)  Admission Status Voluntary  Psychosocial Assessment  Patient Complaints Anxiety  Eye Contact Fair  Facial Expression Animated  Affect Anxious  Speech Logical/coherent  Interaction Assertive  Motor Activity Slow  Appearance/Hygiene Unremarkable  Behavior Characteristics Cooperative;Appropriate to situation  Mood Pleasant  Thought Process  Coherency WDL  Content WDL  Delusions None reported or observed  Perception WDL  Hallucination None reported or observed  Judgment Poor  Confusion None  Danger to Self  Current suicidal ideation? Denies  Danger to Others  Danger to Others None reported or observed

## 2024-10-18 NOTE — [facility] Group Notes (Signed)
 Adult Psychoeducational Group Note  Date:  10/18/2024 Time:  4:54 PM  Group Topic/Focus:  Overcoming Stress:   The focus of this group is to define stress and help patients assess their triggers.  Participation Level:  Active  Participation Quality:  Appropriate  Affect:  Appropriate  Cognitive:  Alert and Appropriate  Insight: Appropriate and Good  Engagement in Group:  Engaged  Modes of Intervention:  Discussion  Additional Comments:  na  Katie Doyle Doing 10/18/2024, 4:54 PM

## 2024-10-18 NOTE — Hospital Course (Signed)
 Admitted for suicidal ideation in the context of drug abuse. Stabilized since admission and seeking inpatient SUD treatment for cocaine use. Accepted at [facility] for the 10th. Have not heard back from [facility] yet. Hopefully SW will be able to figure out options on Monday for something sooner than the 10th.

## 2024-10-18 NOTE — Plan of Care (Signed)
   Problem: Education: Goal: Knowledge of [facility] Education information/materials will improve Outcome: Progressing Goal: Emotional status will improve Outcome: Progressing Goal: Mental status will improve Outcome: Progressing

## 2024-10-18 NOTE — [facility] Suicide Risk Assessment (Signed)
 [facility] INPATIENT:  Family/Significant Other Suicide Prevention Education  Suicide Prevention Education:  Education Completed; Katie Doyle, daughter, [phone],  (name of family member/significant other) has been identified by the patient as the family member/significant other with whom the patient will be residing, and identified as the person(s) who will aid the patient in the event of a mental health crisis (suicidal ideations/suicide attempt).  With written consent from the patient, the family member/significant other has been provided the following suicide prevention education, prior to the and/or following the discharge of the patient.  The suicide prevention education provided includes the following: Suicide risk factors Suicide prevention and interventions National Suicide Hotline telephone number [facility] assessment telephone number [facility] Emergency Assistance 911 [facility] and/or Residential Mobile Crisis Unit telephone number  Request made of family/significant other to: Remove weapons (e.g., guns, rifles, knives), all items previously/currently identified as safety concern.   Remove drugs/medications (over-the-counter, prescriptions, illicit drugs), all items previously/currently identified as a safety concern.  The family member/significant other verbalizes understanding of the suicide prevention education information provided.  The family member/significant other agrees to remove the items of safety concern listed above.  Reports she has no concern about pt harming herself herself or others.   Reports pt would not have anywhere to go if she were to discharge, reports pt needs an in-patient rehab. Reports pt was living in her (Katie Doyle) grandmother's house with no water and no heat because pt could not afford it.   Reports pt has access to kitchen knives.   Reports pt has sounded okay in their conversations but is very tearful right before they  end the phone call   Katie Doyle 10/18/2024, 2:57 PM

## 2024-10-18 NOTE — Group Note (Signed)
 Date:  10/18/2024 Time:  10:33 AM  Group Topic/Focus: Emotional Wellness Emotional Education:   The focus of this group is to discuss what feelings/emotions are, and how they are experienced. To improve communication skills by teaching participants how to express their feelings and needs without blaming others, which helps reduce conflict and defensiveness. By focusing on the speaker's personal experience, participants learn to express themselves more effectively and empathetically, leading to better understanding and more productive conversations.     Participation Level:  Did Not Attend  Katie Doyle 10/18/2024, 10:33 AM

## 2024-10-18 NOTE — Group Note (Signed)
 Date:  10/18/2024 Time:  10:06 PM  Group Topic/Focus:  Wrap-Up Group:   The focus of this group is to help patients review their daily goal of treatment and discuss progress on daily workbooks.    Participation Level:  Active  Participation Quality:  Appropriate  Affect:  Appropriate  Cognitive:  Appropriate  Insight: Appropriate  Engagement in Group:  Engaged  Modes of Intervention:  Discussion  Additional Comments:  Patient shared in group that she had a good day.  Patient stated that her goal is to work on housing   [employer] 10/18/2024, 10:06 PM

## 2024-10-18 NOTE — Group Note (Signed)
 Date:  10/18/2024 Time:  10:15 AM  Group Topic/Focus:  Goals Group:   The focus of this group is to help patients establish daily goals to achieve during treatment and discuss how the patient can incorporate goal setting into their daily lives to aide in recovery. Orientation:   The focus of this group is to educate the patient on the purpose and policies of crisis stabilization and provide a format to answer questions about their admission.  The group details unit policies and expectations of patients while admitted.    Participation Level:  Did Not Attend   Katie Doyle 10/18/2024, 10:15 AM

## 2024-10-18 NOTE — Plan of Care (Signed)
   Problem: Coping: Goal: Ability to verbalize frustrations and anger appropriately will improve Outcome: Progressing Goal: Ability to demonstrate self-control will improve Outcome: Progressing

## 2024-10-18 NOTE — Progress Notes (Signed)
 [facility] Inpatient Psychiatry Progress Note  Date: 10/18/24 Patient: Katie Doyle MRN: [id number]  Assessment and Plan: The patient is a 58 year old female with a history of cocaine abuse and no prior psychiatric admissions. She presented to the behavioral health urgent care accompanied by her daughter requesting substance use treatment.   11/2 - Patient has been doing well. SI appears resolved. BP continues to be elevated. Will add amlodipine 5 mg daily. Currently has a bed date for [facility].   # Cocaine use disorder, severe, dependence (HCC) # Substance induced mood disorder - Sertraline 50 mg daily - Seeking SUD placement  # HTN - lisinopril  40 mg daily - Amlodipine 5 mg daily - Clonidine 0.1 mg BID prn HTN   # Syphilis - Doxycycline 100 mg BID for 28 days (started 10/28)  Risk Assessment - Low  Discharge Planning Estimated length of stay: 3-7 days Predicted Discharge location: Inpatient SUD     Interval History and update: No significant events overnight. Slept about 8 hours. On assessment this morning she reports feeling well. She denies any suicidal ideation. She reports that her mood today is fine and denies feeling significantly depressed. She has been visible in the milieu and socializing with peers and participates well in groups. She continues to express fear of discharging home prior to receiving SUD treatment. She stated that her daughter is researching other SUD programs that she may be able to attend that would have a sooner bed date, such as [facility] [place]. She denies any other concerns.       Physical Exam MSK/Neuro - Normal gait and station  Mental Status Exam Appearance - Casually dressed, appropriate hygiene and grooming Attitude - Calm, polite, not guarded Speech - normal volume, prosody, inflection Mood - Fine Affect - Full Thought Process - LLGD Thought Content - No delusional TC  expressed SI/HI - Denies  Perceptions - Denies AVH; not RIS Judgement/Insight - Good  Fund of knowledge - WNL Language - No impairments      Lab Results:  No visits with results within 1 Day(s) from this visit.  Latest known visit with results is:  Admission on 10/13/2024, Discharged on 10/13/2024  Component Date Value Ref Range Status   WBC 10/13/2024 6.0  4.0 - 10.5 K/uL Final   RBC 10/13/2024 4.21  3.87 - 5.11 MIL/uL Final   Hemoglobin 10/13/2024 12.6  12.0 - 15.0 g/dL Final   HCT 89/71/7974 39.0  36.0 - 46.0 % Final   MCV 10/13/2024 92.6  80.0 - 100.0 fL Final   MCH 10/13/2024 29.9  26.0 - 34.0 pg Final   MCHC 10/13/2024 32.3  30.0 - 36.0 g/dL Final   RDW 89/71/7974 14.1  11.5 - 15.5 % Final   Platelets 10/13/2024 262  150 - 400 K/uL Final   nRBC 10/13/2024 0.0  0.0 - 0.2 % Final   Neutrophils Relative % 10/13/2024 68  % Final   Neutro Abs 10/13/2024 4.1  1.7 - 7.7 K/uL Final   Lymphocytes Relative 10/13/2024 22  % Final   Lymphs Abs 10/13/2024 1.3  0.7 - 4.0 K/uL Final   Monocytes Relative 10/13/2024 6  % Final   Monocytes Absolute 10/13/2024 0.4  0.1 - 1.0 K/uL Final   Eosinophils Relative 10/13/2024 2  % Final   Eosinophils Absolute 10/13/2024 0.1  0.0 - 0.5 K/uL Final   Basophils Relative 10/13/2024 1  % Final   Basophils Absolute 10/13/2024 0.0  0.0 -  0.1 K/uL Final   Immature Granulocytes 10/13/2024 1  % Final   Abs Immature Granulocytes 10/13/2024 0.03  0.00 - 0.07 K/uL Final   Sodium 10/13/2024 139  135 - 145 mmol/L Final   Potassium 10/13/2024 4.2  3.5 - 5.1 mmol/L Final   Chloride 10/13/2024 100  98 - 111 mmol/L Final   CO2 10/13/2024 27  22 - 32 mmol/L Final   Glucose, Bld 10/13/2024 88  70 - 99 mg/dL Final   BUN 89/71/7974 14  6 - 20 mg/dL Final   Creatinine, Ser 10/13/2024 0.77  0.44 - 1.00 mg/dL Final   Calcium 89/71/7974 9.0  8.9 - 10.3 mg/dL Final   Total Protein 89/71/7974 7.4  6.5 - 8.1 g/dL Final   Albumin 89/71/7974 3.5  3.5 - 5.0 g/dL Final   AST  89/71/7974 16  15 - 41 U/L Final   ALT 10/13/2024 12  0 - 44 U/L Final   Alkaline Phosphatase 10/13/2024 97  38 - 126 U/L Final   Total Bilirubin 10/13/2024 0.2  0.0 - 1.2 mg/dL Final   GFR, Estimated 10/13/2024 >60  >60 mL/min Final   Anion gap 10/13/2024 12  5 - 15 Final   Hgb A1c MFr Bld 10/13/2024 5.6  4.8 - 5.6 % Final   Mean Plasma Glucose 10/13/2024 114.02  mg/dL Final   Alcohol, Ethyl (B) 10/13/2024 <15  <15 mg/dL Final   Cholesterol 89/71/7974 217 (H)  0 - 200 mg/dL Final   Triglycerides 89/71/7974 50  <150 mg/dL Final   HDL 89/71/7974 75  >40 mg/dL Final   Total CHOL/HDL Ratio 10/13/2024 2.9  RATIO Final   VLDL 10/13/2024 10  0 - 40 mg/dL Final   LDL Cholesterol 10/13/2024 132 (H)  0 - 99 mg/dL Final   TSH 89/71/7974 0.427  0.350 - 4.500 uIU/mL Final   Preg Test, Ur 10/13/2024 Negative  Negative Final   POC Amphetamine UR 10/13/2024 None Detected  NONE DETECTED (Cut Off Level 1000 ng/mL) Final   POC Secobarbital (BAR) 10/13/2024 None Detected  NONE DETECTED (Cut Off Level 300 ng/mL) Final   POC Buprenorphine (BUP) 10/13/2024 None Detected  NONE DETECTED (Cut Off Level 10 ng/mL) Final   POC Oxazepam (BZO) 10/13/2024 None Detected  NONE DETECTED (Cut Off Level 300 ng/mL) Final   POC Cocaine UR 10/13/2024 Positive (A)  NONE DETECTED (Cut Off Level 300 ng/mL) Final   POC Methamphetamine UR 10/13/2024 None Detected  NONE DETECTED (Cut Off Level 1000 ng/mL) Final   POC Morphine  10/13/2024 None Detected  NONE DETECTED (Cut Off Level 300 ng/mL) Final   POC Methadone UR 10/13/2024 None Detected  NONE DETECTED (Cut Off Level 300 ng/mL) Final   POC Oxycodone  UR 10/13/2024 None Detected  NONE DETECTED (Cut Off Level 100 ng/mL) Final   POC Marijuana UR 10/13/2024 Positive (A)  NONE DETECTED (Cut Off Level 50 ng/mL) Final   Hepatitis B Surface Ag 10/13/2024 NON REACTIVE  NON REACTIVE Final   HCV Ab 10/13/2024 NON REACTIVE  NON REACTIVE Final   Hep A IgM 10/13/2024 NON REACTIVE  NON REACTIVE  Final   Hep B C IgM 10/13/2024 NON REACTIVE  NON REACTIVE Final   Color, Urine 10/13/2024 YELLOW  YELLOW Final   APPearance 10/13/2024 CLEAR  CLEAR Final   Specific Gravity, Urine 10/13/2024 1.017  1.005 - 1.030 Final   pH 10/13/2024 6.0  5.0 - 8.0 Final   Glucose, UA 10/13/2024 NEGATIVE  NEGATIVE mg/dL Final   Hgb urine dipstick 10/13/2024  NEGATIVE  NEGATIVE Final   Bilirubin Urine 10/13/2024 NEGATIVE  NEGATIVE Final   Ketones, ur 10/13/2024 NEGATIVE  NEGATIVE mg/dL Final   Protein, ur 89/71/7974 NEGATIVE  NEGATIVE mg/dL Final   Nitrite 89/71/7974 NEGATIVE  NEGATIVE Final   Leukocytes,Ua 10/13/2024 NEGATIVE  NEGATIVE Final     Vitals: Blood pressure (!) 137/90, pulse 71, temperature 97.6 F (36.4 C), temperature source Oral, resp. rate 16, height 5' 5 (1.651 m), weight 85.3 kg, last menstrual period 01/19/2013, SpO2 98%.    Oliva DELENA Salmon, DO

## 2024-10-19 ENCOUNTER — Encounter (HOSPITAL_COMMUNITY): Payer: Self-pay

## 2024-10-19 MED ORDER — SERTRALINE HCL 50 MG PO TABS
75.0000 mg | ORAL_TABLET | Freq: Every day | ORAL | Status: DC
  Administered 2024-10-20 – 2024-10-21 (×2): 75 mg via ORAL
  Filled 2024-10-19 (×2): qty 1

## 2024-10-19 MED ORDER — NYSTATIN 100000 UNIT/GM EX CREA
TOPICAL_CREAM | Freq: Two times a day (BID) | CUTANEOUS | Status: DC
  Administered 2024-10-21: 1 via TOPICAL
  Filled 2024-10-19: qty 30

## 2024-10-19 NOTE — Progress Notes (Signed)
   10/19/24 0839  Psychosocial Assessment  Patient Complaints None  Eye Contact Fair  Facial Expression Animated  Affect Appropriate to circumstance  Speech Logical/coherent  Interaction Assertive  Motor Activity Slow  Appearance/Hygiene Unremarkable  Behavior Characteristics Cooperative;Appropriate to situation  Mood Pleasant  Thought Process  Coherency WDL  Content WDL  Delusions None reported or observed  Perception WDL  Hallucination None reported or observed  Judgment Impaired  Confusion None  Danger to Self  Current suicidal ideation? Denies  Agreement Not to Harm Self Yes  Description of Agreement Verbal  Danger to Others  Danger to Others None reported or observed

## 2024-10-19 NOTE — Progress Notes (Signed)
(  Sleep Hours) -7.0 (Any PRNs that were needed, meds refused, or side effects to meds)-prn hydroxyzine  and trazodone  @ 2140  (Any disturbances and when (visitation, over night)-none (Concerns raised by the patient)- none (SI/HI/AVH)- denies all
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
## 2024-10-19 NOTE — BH IP Treatment Plan (Signed)
 Interdisciplinary Treatment and Diagnostic Plan Update  10/19/2024 Time of Session: 12:05 PM - UPDATE Katie Doyle MRN: [id number]  Principal Diagnosis: Cocaine use disorder, severe, dependence (HCC)  Secondary Diagnoses: Principal Problem:   Cocaine use disorder, severe, dependence (HCC) Active Problems:   HIV disease (HCC)   Substance induced mood disorder (HCC)   Current Medications:  Current Facility-Administered Medications  Medication Dose Route Frequency Provider Last Rate Last Admin   acetaminophen  (TYLENOL ) tablet 650 mg  650 mg Oral Q6H PRN White, Patrice L, NP   650 mg at 10/19/24 0854   albuterol  (VENTOLIN  HFA) 108 (90 Base) MCG/ACT inhaler 2 puff  2 puff Inhalation Q6H PRN White, Patrice L, NP       amLODipine (NORVASC) tablet 5 mg  5 mg Oral Daily Bouchard, Marc A, DO   5 mg at 10/19/24 0853   bictegravir-emtricitabine-tenofovir AF (BIKTARVY ) 50-200-25 MG per tablet 1 tablet  1 tablet Oral Daily White, Patrice L, NP   1 tablet at 10/19/24 0853   cloNIDine (CATAPRES) tablet 0.1 mg  0.1 mg Oral BID PRN Prentis Kitchens A, DO   0.1 mg at 10/16/24 1817   diphenhydrAMINE  (BENADRYL ) 12.5 MG/5ML elixir 25 mg  25 mg Oral Once PRN Ajibola, Ene A, NP       haloperidol (HALDOL) tablet 5 mg  5 mg Oral TID PRN White, Patrice L, NP       And   diphenhydrAMINE  (BENADRYL ) capsule 50 mg  50 mg Oral TID PRN White, Patrice L, NP   50 mg at 10/16/24 2132   haloperidol lactate (HALDOL) injection 5 mg  5 mg Intramuscular TID PRN White, Patrice L, NP       And   diphenhydrAMINE  (BENADRYL ) injection 50 mg  50 mg Intramuscular TID PRN White, Patrice L, NP       And   LORazepam  (ATIVAN ) injection 2 mg  2 mg Intramuscular TID PRN White, Patrice L, NP       haloperidol lactate (HALDOL) injection 10 mg  10 mg Intramuscular TID PRN White, Patrice L, NP       And   diphenhydrAMINE  (BENADRYL ) injection 50 mg  50 mg Intramuscular TID PRN White, Patrice L, NP       And   LORazepam  (ATIVAN ) injection 2  mg  2 mg Intramuscular TID PRN White, Patrice L, NP       doxycycline (VIBRA-TABS) tablet 100 mg  100 mg Oral BID White, Patrice L, NP   100 mg at 10/19/24 1635   feeding supplement (ENSURE PLUS HIGH PROTEIN) liquid 237 mL  237 mL Oral BID BM Bouchard, Marc A, DO   237 mL at 10/19/24 1312   hydrOXYzine  (ATARAX ) tablet 25 mg  25 mg Oral TID PRN Prentis Kitchens A, DO   25 mg at 10/18/24 2142   lisinopril  (ZESTRIL ) tablet 40 mg  40 mg Oral Daily Prentis Kitchens A, DO   40 mg at 10/19/24 0853   nicotine (NICODERM CQ - dosed in mg/24 hours) patch 14 mg  14 mg Transdermal Daily Prentis Kitchens A, DO   14 mg at 10/18/24 1301   nicotine polacrilex (NICORETTE) gum 2 mg  2 mg Oral PRN Prentis Kitchens A, DO   2 mg at 10/14/24 9156   nystatin cream (MYCOSTATIN)   Topical BID Bennett, Christal H, NP   Given at 10/19/24 1635   [START ON 10/20/2024] sertraline (ZOLOFT) tablet 75 mg  75 mg Oral Daily Bennett, Christal H, NP  traZODone  (DESYREL ) tablet 50 mg  50 mg Oral QHS PRN White, Patrice L, NP   50 mg at 10/18/24 2142   PTA Medications: Medications Prior to Admission  Medication Sig Dispense Refill Last Dose/Taking   albuterol  (VENTOLIN  HFA) 108 (90 Base) MCG/ACT inhaler Inhale 2 puffs into the lungs every 6 (six) hours as needed for wheezing or shortness of breath. (Patient not taking: Reported on 10/13/2024)      bictegravir-emtricitabine-tenofovir AF (BIKTARVY ) 50-200-25 MG TABS tablet Take 1 tablet by mouth daily. (Patient not taking: Reported on 10/13/2024) 30 tablet 4    doxycycline (VIBRA-TABS) 100 MG tablet Take 1 tablet (100 mg total) by mouth 2 (two) times daily for 28 days. (Patient not taking: Reported on 10/13/2024) 56 tablet 0    lisinopril  (ZESTRIL ) 20 MG tablet Take 1 tablet (20 mg total) by mouth daily. 30 tablet 5     Patient Stressors:    Patient Strengths:    Treatment Modalities: Medication Management, Group therapy, Case management,  1 to 1 session with clinician, Psychoeducation,  Recreational therapy.   Physician Treatment Plan for Primary Diagnosis: Cocaine use disorder, severe, dependence (HCC) Long Term Goal(s):     Short Term Goals:    Medication Management: Evaluate patient's response, side effects, and tolerance of medication regimen.  Therapeutic Interventions: 1 to 1 sessions, Unit Group sessions and Medication administration.  Evaluation of Outcomes: Progressing  Physician Treatment Plan for Secondary Diagnosis: Principal Problem:   Cocaine use disorder, severe, dependence (HCC) Active Problems:   HIV disease (HCC)   Substance induced mood disorder (HCC)  Long Term Goal(s):     Short Term Goals:       Medication Management: Evaluate patient's response, side effects, and tolerance of medication regimen.  Therapeutic Interventions: 1 to 1 sessions, Unit Group sessions and Medication administration.  Evaluation of Outcomes: Progressing   RN Treatment Plan for Primary Diagnosis: Cocaine use disorder, severe, dependence (HCC) Long Term Goal(s): Knowledge of disease and therapeutic regimen to maintain health will improve  Short Term Goals: Ability to remain free from injury will improve, Ability to verbalize frustration and anger appropriately will improve, Ability to verbalize feelings will improve, and Ability to disclose and discuss suicidal ideas  Medication Management: RN will administer medications as ordered by provider, will assess and evaluate patient's response and provide education to patient for prescribed medication. RN will report any adverse and/or side effects to prescribing provider.  Therapeutic Interventions: 1 on 1 counseling sessions, Psychoeducation, Medication administration, Evaluate responses to treatment, Monitor vital signs and CBGs as ordered, Perform/monitor CIWA, COWS, AIMS and Fall Risk screenings as ordered, Perform wound care treatments as ordered.  Evaluation of Outcomes: Progressing   LCSW Treatment Plan for  Primary Diagnosis: Cocaine use disorder, severe, dependence (HCC) Long Term Goal(s): Safe transition to appropriate next level of care at discharge, Engage patient in therapeutic group addressing interpersonal concerns.  Short Term Goals: Engage patient in aftercare planning with referrals and resources, Increase ability to appropriately verbalize feelings, Facilitate acceptance of mental health diagnosis and concerns, and Identify triggers associated with mental health/substance abuse issues  Therapeutic Interventions: Assess for all discharge needs, 1 to 1 time with Social worker, Explore available resources and support systems, Assess for adequacy in community support network, Educate family and significant other(s) on suicide prevention, Complete Psychosocial Assessment, Interpersonal group therapy.  Evaluation of Outcomes: Progressing   Progress in Treatment: Attending groups: attended some groups Participating in groups: Yes. Taking medication as prescribed: Yes. Toleration medication:  Yes. Family/Significant other contact made:  Yes, contacted Charlyne Blocker, daughter, [phone]  Patient understands diagnosis: Yes. Discussing patient identified problems/goals with staff: Yes. Medical problems stabilized or resolved: Yes. Denies suicidal/homicidal ideation: Yes. Issues/concerns per patient self-inventory: No.   New problem(s) identified: No, Describe:  none   New Short Term/Long Term Goal(s): detox, medication management for mood stabilization; elimination of SI thoughts; development of comprehensive mental wellness/sobriety plan     Patient Goals:  Get into substance use treatment   Discharge Plan or Barriers: Patient recently admitted. CSW will continue to follow and assess for appropriate referrals and possible discharge planning.      Reason for Continuation of Hospitalization: Depression Medication stabilization Other; describe Substance use    Estimated Length of  Stay: 1 - 2 days  Last 3 [place] Suicide Severity Risk Score: Flowsheet Row [facility] Most recent reading at 10/13/2024  8:00 PM ED from 10/13/2024 in [facility] Most recent reading at 10/13/2024  1:35 PM ED from 07/27/2024 in [facility] Most recent reading at 07/27/2024 10:31 PM  C-SSRS RISK CATEGORY Moderate Risk Moderate Risk No Risk    Last PHQ 2/9 Scores:    07/11/2022   10:39 AM 08/13/2016   10:21 AM 01/13/2016   11:12 AM  Depression screen PHQ 2/9  Decreased Interest 1 0 0  Down, Depressed, Hopeless 1 0 0  PHQ - 2 Score 2 0 0    Scribe for Treatment Team: Elynore Dolinski O Jaymason Ledesma, LCSWA 10/19/2024 4:56 PM

## 2024-10-19 NOTE — Group Note (Signed)
 Date:  10/19/2024 Time:  3:07 PM  Group Topic/Focus: Grief and Loss Group with the Chaplain   Pt did not attend Grief and Loss Group with the Chaplain   Taniaya Rudder R Aubreanna Percle 10/19/2024, 3:07 PM

## 2024-10-19 NOTE — Group Note (Signed)
 Recreation Therapy Group Note   Group Topic:Team Building  Group Date: 10/19/2024 Start Time: 0932 End Time: 1005 Facilitators: Arye Weyenberg-McCall, LRT,CTRS Location: 300 Hall Dayroom   Group Topic: Communication, Team Building, Problem Solving  Goal Area(s) Addresses:  Patient will effectively work with peer towards shared goal.  Patient will identify skills used to make activity successful.  Patient will share challenges and verbalize solution-driven approaches used. Patient will identify how skills used during activity can be used to reach post d/c goals.   Behavioral Response:   Intervention: STEM Activity   Activity: [employer]. Patients were provided the following materials: 5 drinking straws, 5 rubber bands, 5 paper clips, 2 index cards and 2 drinking cups. Using the provided materials patients were asked to build a launching mechanism to launch a ping pong ball across the room, approximately 10 feet. Patients were divided into teams of 3-5. Instructions required all materials be incorporated into the device, functionality of items left to the peer group's discretion.  Education: Pharmacist, Community, Scientist, Physiological, Air Cabin Crew, Building Control Surveyor.   Education Outcome: Acknowledges education/In group clarification offered/Needs additional education.    Affect/Mood: N/A   Participation Level: Did not attend    Clinical Observations/Individualized Feedback:     Plan: Continue to engage patient in RT group sessions 2-3x/week.   Mira Balon-McCall, LRT,CTRS 10/19/2024 11:31 AM

## 2024-10-19 NOTE — Plan of Care (Signed)

## 2024-10-19 NOTE — Group Note (Signed)
 Date:  10/19/2024 Time:  12:31 PM  Group Topic/Focus:  Emotional Education:   The focus of this group is to discuss what feelings/emotions are, and how they are experienced. Physical Wellness: To promote an active, healthy lifestyle through regular physical activity, proper nutrition, and good sleep habits to improve overall health, well-being, and disease prevention.    Participation Level:  Active  Participation Quality:  Appropriate  Affect:  Appropriate  Cognitive:  Appropriate  Insight: Appropriate  Engagement in Group:  Engaged  Modes of Intervention:  Activity, Discussion, and Education  Additional Comments:  Pt did not attend emotional wellness portion of group. Pt did attend physical wellness group from 11:30-12:00.   Whisper Kurka R Guillermo Nehring 10/19/2024, 12:31 PM

## 2024-10-19 NOTE — Progress Notes (Signed)
 CONTACT NOTE:  [facility] [phone]  CSW spoke with Jordan from [facility] to confirm patient would like to accept inpatient treatment. Patient is scheduled to arrive Wednesday 12th at 9am.   SIGNED: Louetta Lame, LCSW-A

## 2024-10-19 NOTE — Group Note (Signed)
 Date:  10/19/2024 Time:  10:18 AM  Group Topic/Focus: Recreational Therapy Group    Pt did not attend recreational therapy group  Starlene Consuegra R Briannah Lona 10/19/2024, 10:18 AM

## 2024-10-19 NOTE — Group Note (Signed)
 Date:  10/19/2024 Time:  3:42 PM  Group Topic/Focus: Occupational Therapy     Pt did attend occupational therapy group  Kenzie Thoreson R Tiann Saha 10/19/2024, 3:42 PM

## 2024-10-19 NOTE — Group Note (Signed)
 Date:  10/19/2024 Time:  9:46 AM  Group Topic/Focus:  Goals Group:   The focus of this group is to help patients establish daily goals to achieve during treatment and discuss how the patient can incorporate goal setting into their daily lives to aide in recovery. Orientation:   The focus of this group is to educate the patient on the purpose and policies of crisis stabilization and provide a format to answer questions about their admission.  The group details unit policies and expectations of patients while admitted.    Participation Level:  Did Not Attend   Katie Doyle 10/19/2024, 9:46 AM

## 2024-10-19 NOTE — Progress Notes (Signed)
 Upon patient request, CSW provided [facility] phone number for patient. [phone]  SIGNED: Louetta Lame, LCSW-A

## 2024-10-19 NOTE — [facility] Group Notes (Signed)
 The focus of this group is to help patients review their daily goal of treatment and discuss progress on daily workbooks. Pt was attentive and appropriate during tonight's wrap up group discussion with aa.

## 2024-10-20 DIAGNOSIS — F331 Major depressive disorder, recurrent, moderate: Principal | ICD-10-CM | POA: Insufficient documentation

## 2024-10-20 NOTE — BHH Group Notes (Signed)
 Katie Doyle did not attend the [facility] group today 10/20/24 from [phone].

## 2024-10-20 NOTE — Group Note (Signed)
 Recreation Therapy Group Note   Group Topic:Animal Assisted Therapy   Group Date: 10/20/2024 Start Time: 9051 End Time: 1030 Facilitators: Yicel Shannon-McCall, LRT,CTRS Location: 300 Hall Dayroom   Animal-Assisted Activity (AAA) Program Checklist/Progress Notes Patient Eligibility Criteria Checklist & Daily Group note for Rec Tx Intervention  AAA/T Program Assumption of Risk Form signed by Patient/ or Parent Legal Guardian Yes  Patient is free of allergies or severe asthma Yes  Patient reports no fear of animals Yes  Patient reports no history of cruelty to animals Yes  Patient understands his/her participation is voluntary Yes  Patient washes hands before animal contact Yes  Patient washes hands after animal contact Yes  Behavioral Response:    Education: Hand Washing, Appropriate Animal Interaction   Education Outcome: Acknowledges education.    Affect/Mood: N/A   Participation Level: Did not attend    Clinical Observations/Individualized Feedback:      Plan: Continue to engage patient in RT group sessions 2-3x/week.   Mustaf Antonacci-McCall, LRT,CTRS 10/20/2024 1:06 PM

## 2024-10-20 NOTE — Group Note (Signed)
 Date:  10/20/2024 Time:  10:14 AM  Group Topic/Focus:  Goals Group:   Today's group explored Maslow's Hierarchy of Needs as a framework for understanding personal growth and recovery. Patients discussed each level of the hierarchy--physiological, safety, love/belonging, esteem, and self-actualization--and identified coping skills that support progress within each area. The group then applied this understanding to goal setting, identifying how meeting foundational needs and using healthy coping strategies can promote movement toward higher levels of recovery. Patients set individualized goals for the next week, month, and year, connecting each goal to specific coping skills and personal needs. Discussion emphasized the importance of balance, self-awareness, and consistent effort in maintaining recovery.   Participation Level:  Did Not Attend  Participation Quality:  N/A  Affect:  N/A  Cognitive:  N/A  Insight: None  Engagement in Group:  None  Modes of Intervention:  N/A  Additional Comments:  Katie Doyle did not attend goals group.  Kristi HERO Epic Tribbett 10/20/2024, 10:14 AM

## 2024-10-20 NOTE — [facility] Group Notes (Signed)
 Katie Doyle attended the social work group today, 10/20/24 from 1100-1200.

## 2024-10-20 NOTE — B[facility] Group Notes (Signed)
 Katie Doyle did not attend the recreational therapy/pet therapy group today, 10/20/24, from [phone].

## 2024-10-20 NOTE — Group Note (Signed)
 LCSW Group Therapy Note   Group Date: 10/20/2024 Start Time: 1100 End Time: 1200   Participation:  patient was present and actively participated in the discussion.    Type of Therapy:  Group Therapy   Topic:  Speaking from the Heart: Communicating with Understanding and Empathy  Objective:  To help participants develop effective communication skills to express themselves clearly, listen actively, and navigate conflicts in a healthy way.  Goals: - Increase awareness of verbal and non-verbal communication skills. - Practice using "I" statements and active listening techniques. - Learn coping strategies for managing communication stress.  Summary:  Participants explored the importance of communication, discussed challenges, and practiced skills such as active listening and assertive expression. They reflected on past experiences and identified ways to improve communication in their daily lives.  Therapeutic Modalities: Cognitive-Behavioral Therapy (CBT): Restructuring negative thought patterns in communication. Mindfulness: Staying present and calm during conversations. Psychoeducation: Learning about effective communication techniques.   Lillieann Pavlich O Ory Elting, LCSWA 10/20/2024  12:38 PM

## 2024-10-20 NOTE — Plan of Care (Signed)
   Problem: Education: Goal: Emotional status will improve Outcome: Progressing Goal: Mental status will improve Outcome: Progressing

## 2024-10-20 NOTE — Progress Notes (Signed)
  [facility] Adult Case Management Discharge Plan :  Will you be returning to the same living situation after discharge:  No. Patient will be temporarily residing with her daughter, Charlyne Blocker, until scheduled inpatient start date at [facility]). At discharge, do you have transportation home?: Yes,  patient's daughter, Charlyne Pole, will be providing transportation at 3pm. Do you have the ability to pay for your medications: Yes,  patient has active health insurance.  Release of information consent forms completed and in the chart;  Patient's signature needed at discharge.  Patient to Follow up at:  Follow-up Information     [facility] Follow up.   Why: You may call this provider if you wish to schedule a hospital follow up appointment for therapy and medication management services.  The appointments are Virtual. Contact information: [address]  [address] [place] [address] [phone]         [facility] Follow up.   Specialty: Addiction Medicine Why: Referral made Contact information: [address] [place] [address] [phone]         Services, [facility]. Go on 10/28/2024.   Why: Please go to this provider for residential treatment on 10/28/24 at 9:00 am. Contact information: [address] [place] [address] [phone]                 Next level of care provider has access to [facility] Link:no  Safety Planning and Suicide Prevention discussed: Yes,  completed with Charlyne Blocker (daughter) [phone].      Has patient been referred to the Quitline?: Patient refused referral for treatment  Patient has been referred for addiction treatment: Yes, the patient will follow up with an outpatient provider for substance use disorder. Psychiatrist/APP: appointment made. See above.  Louetta Lame, LCSWA 10/20/2024, 4:18 PM

## 2024-10-20 NOTE — Progress Notes (Signed)
 CONTACT NOTE:   Charlyne Blocker (daughter) [phone]  CSW confirmed with Charlyne that patient can reside with her until patient enters inpatient treatment at [facility] on 10/28/24 at 9 am. Charlyne is aware of necessary medications that need to be taken at day of admission for [facility] and has the information to follow up with them for any reason.  SIGNED: Meiko Ives Nunez-Uva, LCSW-A

## 2024-10-20 NOTE — Plan of Care (Signed)

## 2024-10-20 NOTE — Progress Notes (Signed)
   10/20/24 1000  Psych Admission Type (Psych Patients Only)  Admission Status Voluntary  Psychosocial Assessment  Patient Complaints None  Eye Contact Fair  Facial Expression Animated  Affect Appropriate to circumstance  Speech Logical/coherent  Interaction Assertive  Motor Activity Slow  Appearance/Hygiene Unremarkable  Behavior Characteristics Appropriate to situation  Mood Pleasant  Thought Process  Coherency WDL  Content WDL  Delusions None reported or observed  Perception WDL  Hallucination None reported or observed  Judgment Impaired  Confusion None  Danger to Self  Current suicidal ideation? Denies  Danger to Others  Danger to Others None reported or observed

## 2024-10-20 NOTE — B[facility] Group Notes (Signed)
 [facility] Group Notes:  (Nursing/MHT/Case Management/Adjunct)  Date:  10/20/2024  Time:  9:32 PM  Type of Therapy:  Psychoeducational Skills  Participation Level:  Minimal  Participation Quality:  Attentive  Affect:  Flat  Cognitive:  Lacking  Insight:  Lacking  Engagement in Group:  Limited  Modes of Intervention:  Education  Summary of Progress/Problems: The patient rated her day as a 10 out of a possible 10. She anticipates being discharged tomorrow and states that she had a good phone call.   Katie Doyle S 10/20/2024, 9:32 PM

## 2024-10-20 NOTE — Progress Notes (Addendum)
   10/19/24 1500  Spiritual Encounters  Type of Visit Initial  Care provided to: Patient  Referral source Patient request  Reason for visit Grief/loss  OnCall Visit No  Spiritual Framework  Presenting Themes Impactful experiences and emotions;Courage hope and growth  Interventions  Spiritual Care Interventions Made Narrative/life review;Compassionate presence;Bereavement/grief support   I visited at length with Ms Jeyla Bulger at her request folllowing spirituality group.  [name] and I met in the alcove in the open area of the unit. She engaged in some life review, reflecting primarily on numerous losses of family (including mother and other family for whom Shirlean served as primary caregiver); another focus was on grief and loss that has driven substance misuse. Further losses include loss due to traumatic experiences within family system within home where she has been attempting to reside without utilities. Shakeela was appropriately tearful. She shared her Sherlean faith as a source of strength and meaning and hope for recovery.  I offered compassionate, non-anxious presence and normalization of emotions. I engaged in grief support and education (per Worden's tasks of grief), and explored cultural and family expectations (to become a education administrator, to care for others ahead of self, not feeling free or safe to express and process emotions). I facilitated naming other losses in life as sources of grief not yet healed and encouraged ongoing therapeutic intervention as well as encouragement for sobriety goals and treatment plan. Engaged Marlenne's values and faith beliefs to facilitate her meaning making and hope. I offered prayer with Tavonna's consent.  Pailynn Vahey L. Delores HERO.Div

## 2024-10-20 NOTE — Progress Notes (Signed)
 [facility] Inpatient Psychiatry Progress Note  Date: 10/20/24 Patient: Katie Doyle MRN: [id number]  Assessment and Plan: The patient is a 58 year old female with a history of cocaine abuse and no prior psychiatric admissions. She presented to the behavioral health urgent care accompanied by her daughter requesting substance use treatment.   11/4: Patient appears to be responding well to treatment and remains compliant with her medication regimen, tolerating the recent increase in Zoloft without issue.  Overall, the patient's mood has improved and is stable; she is scheduled for discharge on Wednesday, November?5, pending continued stability and absence of new concerns. She will be discharged home under the care of her daughter and has a follow-up admission scheduled at [facility] for SUD treatment program on November?12 at 9:00?AM. This morning she had an elevated blood pressure reading, which normalized after antihypertensive medication on re-check; we will continue to monitor. The plan is to continue the current treatment regimen.   11/3 - Patient remains future-oriented and motivated toward recovery and aftercare. Insight into her anxiety and depression is improving. Tolerating current psychiatric medications without adverse effects. Plan to increase Zoloft to 75 mg daily and continue to monitor for tolerability and response. She is visible and socializing appropriately on the milieu. Denies acute concerns. Anticipate discharge within the next 24-48 hours to her daughter's home.She has been accepted into [facility] for SUD Treatment Program with admission scheduled for Wednesday, November 12 at 9:00 a.m. Blood pressure remains mildly elevated, amlodipine recently started yesterday in  addition to lisinopril . Continue to monitor BP and medication response.      # Major depressive disorder, recurrent episode, moderate (HCC) - Increase Sertraline to 75 mg daily - Seeking SUD  placement  # HTN - lisinopril  40 mg daily - Amlodipine 5 mg daily - Clonidine 0.1 mg BID prn HTN   # Syphilis - Doxycycline 100 mg BID for 28 days (started 10/28)  Risk Assessment - Low  Discharge Planning Estimated length of stay: 3-7 days Predicted Discharge location: Inpatient SUD     Interval History and update: No significant events overnight. Slept about 7 hours. On assessment today the patient reports that her mood is currently euthymic and improved since admission, and that it remains stable. She states her anxiety symptoms are at a manageable level. She reports that her sleep and appetite are stable, that her concentration is without complaint, and that her energy level is adequate. She denies any suicidal thoughts, intent, or plan, denies any homicidal ideation, and denies any psychotic symptoms. She also reports no side-effects from her current psychiatric medications. She denies withdrawal symptoms or cravings.    Discharge planning was discussed, emphasized the importance of medication adherence and attending all follow-up appointments to prevent symptom exacerbation. We reviewed psychosocial stressors including grief and the loss of multiple family members.    I personally spent a total of 45 minutes in the care of the patient today including preparing to see the patient, counseling and educating, documenting clinical information in the EHR, coordinating care, and discussing discharge planning.      Physical Exam MSK/Neuro - Normal gait and station  Mental Status Exam Appearance - Casually dressed, appropriate hygiene and grooming Attitude - Calm, polite, not guarded Speech - normal volume, prosody, inflection Mood - OK Affect - Full Thought Process - LLGD Thought Content - No delusional TC expressed SI/HI - Denies  Perceptions - Denies AVH; not RIS Judgement/Insight - Good  Fund of knowledge - WNL Language - No impairments  Lab Results:  No visits  with results within 1 Day(s) from this visit.  Latest known visit with results is:  Admission on 10/13/2024, Discharged on 10/13/2024  Component Date Value Ref Range Status   WBC 10/13/2024 6.0  4.0 - 10.5 K/uL Final   RBC 10/13/2024 4.21  3.87 - 5.11 MIL/uL Final   Hemoglobin 10/13/2024 12.6  12.0 - 15.0 g/dL Final   HCT 89/71/7974 39.0  36.0 - 46.0 % Final   MCV 10/13/2024 92.6  80.0 - 100.0 fL Final   MCH 10/13/2024 29.9  26.0 - 34.0 pg Final   MCHC 10/13/2024 32.3  30.0 - 36.0 g/dL Final   RDW 89/71/7974 14.1  11.5 - 15.5 % Final   Platelets 10/13/2024 262  150 - 400 K/uL Final   nRBC 10/13/2024 0.0  0.0 - 0.2 % Final   Neutrophils Relative % 10/13/2024 68  % Final   Neutro Abs 10/13/2024 4.1  1.7 - 7.7 K/uL Final   Lymphocytes Relative 10/13/2024 22  % Final   Lymphs Abs 10/13/2024 1.3  0.7 - 4.0 K/uL Final   Monocytes Relative 10/13/2024 6  % Final   Monocytes Absolute 10/13/2024 0.4  0.1 - 1.0 K/uL Final   Eosinophils Relative 10/13/2024 2  % Final   Eosinophils Absolute 10/13/2024 0.1  0.0 - 0.5 K/uL Final   Basophils Relative 10/13/2024 1  % Final   Basophils Absolute 10/13/2024 0.0  0.0 - 0.1 K/uL Final   Immature Granulocytes 10/13/2024 1  % Final   Abs Immature Granulocytes 10/13/2024 0.03  0.00 - 0.07 K/uL Final   Sodium 10/13/2024 139  135 - 145 mmol/L Final   Potassium 10/13/2024 4.2  3.5 - 5.1 mmol/L Final   Chloride 10/13/2024 100  98 - 111 mmol/L Final   CO2 10/13/2024 27  22 - 32 mmol/L Final   Glucose, Bld 10/13/2024 88  70 - 99 mg/dL Final   BUN 89/71/7974 14  6 - 20 mg/dL Final   Creatinine, Ser 10/13/2024 0.77  0.44 - 1.00 mg/dL Final   Calcium 89/71/7974 9.0  8.9 - 10.3 mg/dL Final   Total Protein 89/71/7974 7.4  6.5 - 8.1 g/dL Final   Albumin 89/71/7974 3.5  3.5 - 5.0 g/dL Final   AST 89/71/7974 16  15 - 41 U/L Final   ALT 10/13/2024 12  0 - 44 U/L Final   Alkaline Phosphatase 10/13/2024 97  38 - 126 U/L Final   Total Bilirubin 10/13/2024 0.2  0.0 - 1.2  mg/dL Final   GFR, Estimated 10/13/2024 >60  >60 mL/min Final   Anion gap 10/13/2024 12  5 - 15 Final   Hgb A1c MFr Bld 10/13/2024 5.6  4.8 - 5.6 % Final   Mean Plasma Glucose 10/13/2024 114.02  mg/dL Final   Alcohol, Ethyl (B) 10/13/2024 <15  <15 mg/dL Final   Cholesterol 89/71/7974 217 (H)  0 - 200 mg/dL Final   Triglycerides 89/71/7974 50  <150 mg/dL Final   HDL 89/71/7974 75  >40 mg/dL Final   Total CHOL/HDL Ratio 10/13/2024 2.9  RATIO Final   VLDL 10/13/2024 10  0 - 40 mg/dL Final   LDL Cholesterol 10/13/2024 132 (H)  0 - 99 mg/dL Final   TSH 89/71/7974 0.427  0.350 - 4.500 uIU/mL Final   Preg Test, Ur 10/13/2024 Negative  Negative Final   POC Amphetamine UR 10/13/2024 None Detected  NONE DETECTED (Cut Off Level 1000 ng/mL) Final   POC Secobarbital (BAR) 10/13/2024 None Detected  NONE DETECTED (Cut Off Level 300 ng/mL) Final   POC Buprenorphine (BUP) 10/13/2024 None Detected  NONE DETECTED (Cut Off Level 10 ng/mL) Final   POC Oxazepam (BZO) 10/13/2024 None Detected  NONE DETECTED (Cut Off Level 300 ng/mL) Final   POC Cocaine UR 10/13/2024 Positive (A)  NONE DETECTED (Cut Off Level 300 ng/mL) Final   POC Methamphetamine UR 10/13/2024 None Detected  NONE DETECTED (Cut Off Level 1000 ng/mL) Final   POC Morphine  10/13/2024 None Detected  NONE DETECTED (Cut Off Level 300 ng/mL) Final   POC Methadone UR 10/13/2024 None Detected  NONE DETECTED (Cut Off Level 300 ng/mL) Final   POC Oxycodone  UR 10/13/2024 None Detected  NONE DETECTED (Cut Off Level 100 ng/mL) Final   POC Marijuana UR 10/13/2024 Positive (A)  NONE DETECTED (Cut Off Level 50 ng/mL) Final   Hepatitis B Surface Ag 10/13/2024 NON REACTIVE  NON REACTIVE Final   HCV Ab 10/13/2024 NON REACTIVE  NON REACTIVE Final   Hep A IgM 10/13/2024 NON REACTIVE  NON REACTIVE Final   Hep B C IgM 10/13/2024 NON REACTIVE  NON REACTIVE Final   Color, Urine 10/13/2024 YELLOW  YELLOW Final   APPearance 10/13/2024 CLEAR  CLEAR Final   Specific  Gravity, Urine 10/13/2024 1.017  1.005 - 1.030 Final   pH 10/13/2024 6.0  5.0 - 8.0 Final   Glucose, UA 10/13/2024 NEGATIVE  NEGATIVE mg/dL Final   Hgb urine dipstick 10/13/2024 NEGATIVE  NEGATIVE Final   Bilirubin Urine 10/13/2024 NEGATIVE  NEGATIVE Final   Ketones, ur 10/13/2024 NEGATIVE  NEGATIVE mg/dL Final   Protein, ur 89/71/7974 NEGATIVE  NEGATIVE mg/dL Final   Nitrite 89/71/7974 NEGATIVE  NEGATIVE Final   Leukocytes,Ua 10/13/2024 NEGATIVE  NEGATIVE Final     Vitals: Blood pressure 131/74, pulse 63, temperature 97.6 F (36.4 C), temperature source Oral, resp. rate 16, height 5' 5 (1.651 m), weight 85.3 kg, last menstrual period 01/19/2013, SpO2 99%.    Blair Chiquita Hint, NP  Patient ID: Aribella Vavra, female   DOB: 10-Sep-1966, 58 y.o.   MRN: [id number] Patient ID: Derrika Ruffalo, female   DOB: 01-Aug-1966, 58 y.o.   MRN: [id number]

## 2024-10-20 NOTE — Group Note (Signed)
 Date:  10/20/2024 Time:  5:44 PM  Group Topic/Focus: Compliance with care/Identifying resources Wellness Toolbox:   The focus of this group is to discuss various aspects of wellness, balancing those aspects and exploring ways to increase the ability to experience wellness.  Patients will create a wellness toolbox for use upon discharge.    Participation Level:  Active  Participation Quality:  Appropriate  Affect:  Appropriate  Cognitive:  Appropriate  Insight: Appropriate  Engagement in Group:  Engaged  Modes of Intervention:  Discussion  Additional Comments:  Pt engaged appropriately in group.  Clint Strupp D Adarryl Goldammer 10/20/2024, 5:44 PM

## 2024-10-21 DIAGNOSIS — F331 Major depressive disorder, recurrent, moderate: Principal | ICD-10-CM

## 2024-10-21 MED ORDER — NYSTATIN 100000 UNIT/GM EX CREA: TOPICAL_CREAM | Freq: Two times a day (BID) | CUTANEOUS | 0 refills | Status: AC

## 2024-10-21 MED ORDER — LISINOPRIL 40 MG PO TABS: 40.0000 mg | ORAL_TABLET | Freq: Every day | ORAL | 0 refills | Status: DC

## 2024-10-21 MED ORDER — AMLODIPINE BESYLATE 5 MG PO TABS: 5.0000 mg | ORAL_TABLET | Freq: Every day | ORAL | 0 refills | Status: DC

## 2024-10-21 MED ORDER — TRAZODONE HCL 50 MG PO TABS: 50.0000 mg | ORAL_TABLET | Freq: Every evening | ORAL | 0 refills | Status: AC | PRN

## 2024-10-21 MED ORDER — SERTRALINE HCL 50 MG PO TABS: 75.0000 mg | ORAL_TABLET | Freq: Every day | ORAL | 0 refills | Status: AC

## 2024-10-21 MED ORDER — HYDROXYZINE HCL 25 MG PO TABS: 25.0000 mg | ORAL_TABLET | Freq: Three times a day (TID) | ORAL | 0 refills | Status: AC | PRN

## 2024-10-21 MED ORDER — DOXYCYCLINE HYCLATE 100 MG PO TABS
100.0000 mg | ORAL_TABLET | Freq: Two times a day (BID) | ORAL | 0 refills | Status: AC
Start: 2024-10-21 — End: 2024-11-10

## 2024-10-21 MED ORDER — NICOTINE POLACRILEX 2 MG MT GUM: 2.0000 mg | CHEWING_GUM | OROMUCOSAL | Status: AC | PRN

## 2024-10-21 MED ORDER — BIKTARVY 50-200-25 MG PO TABS: 1.0000 | ORAL_TABLET | Freq: Every day | ORAL | 0 refills | Status: DC

## 2024-10-21 NOTE — Group Note (Signed)
 Date:  10/21/2024 Time:  10:39 AM  Group Topic/Focus:  Recreation Therapy     Participation Level:  Did Not Attend  Katie Doyle 10/21/2024, 10:39 AM

## 2024-10-21 NOTE — Progress Notes (Signed)
 Patient denies SI, AH, VH. Patient stated they slept Very good last night. Scored zero on anxiety and 1/10 on depression. Patient goal is to Consistency. Patient has been calm, cooperative, and med compliant.      10/21/24 1100  Psych Admission Type (Psych Patients Only)  Admission Status Voluntary  Psychosocial Assessment  Patient Complaints None  Eye Contact Fair  Facial Expression Animated  Affect Appropriate to circumstance  Speech Logical/coherent  Interaction Assertive  Motor Activity Other (Comment) (WDL)  Appearance/Hygiene Unremarkable  Behavior Characteristics Cooperative;Appropriate to situation;Calm  Mood Pleasant  Thought Process  Coherency WDL  Content WDL  Delusions None reported or observed  Perception WDL  Hallucination None reported or observed  Judgment Impaired  Confusion None  Danger to Self  Current suicidal ideation? Denies  Agreement Not to Harm Self Yes  Description of Agreement Verbal  Danger to Others  Danger to Others None reported or observed

## 2024-10-21 NOTE — Group Note (Signed)
 Recreation Therapy Group Note   Group Topic:Communication  Group Date: 10/21/2024 Start Time: 0940 End Time: 1025 Facilitators: Darek Eifler-McCall, LRT,CTRS Location: [address] Day[address]   Group Topic: Communication, Problem Solving   Goal Area(s) Addresses:  Patient will effectively listen to complete activity.  Patient will identify communication skills used to make activity successful.  Patient will identify how skills used during activity can be used to reach post d/c goals.    Behavioral Response: Active   Intervention: Building Surveyor Activity - Geometric pattern cards, pencils, blank paper    Activity: Geometric Drawings.  Three volunteers from the peer group will be shown an abstract picture with a particular arrangement of geometrical shapes.  Each round, one 'speaker' will describe the pattern, as accurately as possible without revealing the image to the group.  The remaining group members will listen and draw the picture to reflect how it is described to them. Patients with the role of 'listener' cannot ask clarifying questions but, may request that the speaker repeat a direction. Once the drawings are complete, the presenter will show the rest of the group the picture and compare how close each person came to drawing the picture. LRT will facilitate a post-activity discussion regarding effective communication and the importance of planning, listening, and asking for clarification in daily interactions with others.  Education: Environmental consultant, Active listening, Support systems, Discharge planning  Education Outcome: Acknowledges understanding/In group clarification offered/Needs additional education.    Affect/Mood: Appropriate   Participation Level: Active   Participation Quality: Independent   Behavior: Appropriate   Speech/Thought Process: Focused   Insight: Good   Judgement: Good   Modes of Intervention: Activity   Patient Response to  Interventions:  Attentive   Education Outcome:  In group clarification offered    Clinical Observations/Individualized Feedback: Pt was late to group and there for the last picture. Pt was attentive and focused for the drawing she heard. Pt appeared proud of herself for being able to get her drawing close to the original.      Plan: Continue to engage patient in RT group sessions 2-3x/week.   Zyanna Leisinger-McCall, LRT,CTRS 10/21/2024 12:39 PM

## 2024-10-21 NOTE — Group Note (Signed)
 Date:  10/21/2024 Time:  9:50 AM  Group Topic/Focus:  Emotional Education:   The focus of this group is to discuss what feelings/emotions are, and how they are experienced. Goals Group:   The focus of this group is to help patients establish daily goals to achieve during treatment and discuss how the patient can incorporate goal setting into their daily lives to aide in recovery.    Participation Level:  Did Not Attend  Katie Doyle 10/21/2024, 9:50 AM

## 2024-10-21 NOTE — Group Note (Signed)
 Date:  10/21/2024 Time:  5:08 PM  Group Topic/Focus:  Dimensions of Wellness:   The focus of this group is to introduce the topic of wellness and discuss the role each dimension of wellness plays in total health.    Participation Level:  Active  Participation Quality:  Appropriate  Affect:  Appropriate  Cognitive:  Alert  Insight: Appropriate  Engagement in Group:  Engaged  Modes of Intervention:  Activity, Discussion, and Education  Additional Comments:    Asberry CROME Dahmir Epperly 10/21/2024, 5:08 PM

## 2024-10-21 NOTE — BHH Suicide Risk Assessment (Signed)
 Suicide Risk Assessment  Discharge Assessment    [facility] Discharge Suicide Risk Assessment   Principal Problem: Major depressive disorder, recurrent episode, moderate (HCC) Discharge Diagnoses: Principal Problem:   Major depressive disorder, recurrent episode, moderate (HCC) Active Problems:   HIV disease (HCC)   Cocaine use disorder, severe, dependence (HCC)  Reason for Admission: The patient is a 58 year old female with a history of cocaine abuse and no prior psychiatric admissions. She presented to the behavioral health urgent care accompanied by her daughter requesting substance use treatment.   Total Time spent with patient: 30 minutes  Musculoskeletal: Strength & Muscle Tone: within normal limits Gait & Station: normal Patient leans: N/A  Psychiatric Specialty Exam  Presentation  General Appearance:  Casual; Fairly Groomed  Eye Contact: Good  Speech: Clear and Coherent; Normal Rate  Speech Volume: Normal  Handedness:No data recorded  Mood and Affect  Mood: Euthymic  Duration of Depression Symptoms: No data recorded Affect: Appropriate; Full Range   Thought Process  Thought Processes: Coherent; Linear  Descriptions of Associations:Intact  Orientation:Full (Time, Place and Person)  Thought Content:Logical  History of Schizophrenia/Schizoaffective disorder:No data recorded Duration of Psychotic Symptoms:No data recorded Hallucinations:Hallucinations: None  Ideas of Reference:None  Suicidal Thoughts:Suicidal Thoughts: No  Homicidal Thoughts:Homicidal Thoughts: No   Sensorium  Memory: Immediate Good; Recent Good; Remote Good  Judgment: Intact  Insight: Present   Executive Functions  Concentration: Good  Attention Span: Good  Recall: Good  Fund of Knowledge: Good  Language: Good   Psychomotor Activity  Psychomotor Activity:Psychomotor Activity: Normal   Assets  Assets: Communication Skills; Desire for Improvement;  Housing; Resilience; Social Support   Sleep  Sleep:Sleep: Good  Estimated Sleeping Duration (Last 24 Hours): 7.25-9.25 hours (Due to Daylight Saving Time, the durations displayed may not accurately represent documentation during the time change interval)  Physical Exam: Physical Exam Vitals and nursing note reviewed.  Constitutional:      General: She is not in acute distress.    Appearance: Normal appearance. She is not ill-appearing.  HENT:     Mouth/Throat:     Pharynx: Oropharynx is clear.  Cardiovascular:     Pulses: Normal pulses.  Pulmonary:     Effort: No respiratory distress.  Skin:    General: Skin is dry.  Neurological:     Mental Status: She is alert and oriented to person, place, and time. Mental status is at baseline.  Psychiatric:        Mood and Affect: Mood normal.        Behavior: Behavior normal.        Thought Content: Thought content normal.        Judgment: Judgment normal.    Review of Systems  Psychiatric/Behavioral:  Positive for depression and substance abuse. Negative for hallucinations, memory loss and suicidal ideas. The patient is nervous/anxious. The patient does not have insomnia.        Stable for lower level of care.   All other systems reviewed and are negative.  Blood pressure (!) 146/97, pulse 63, temperature 98.1 F (36.7 C), temperature source Oral, resp. rate 20, height 5' 5 (1.651 m), weight 85.3 kg, last menstrual period 01/19/2013, SpO2 99%. Body mass index is 31.28 kg/m.  Mental Status Per Nursing Assessment::   On Admission:  NA  Demographic Factors:  Unemployed  Loss Factors: Loss of significant relationship  Historical Factors: Impulsivity  Risk Reduction Factors:   Sense of responsibility to family, Religious beliefs about death, Living with another person, especially  a relative, Positive social support, Positive therapeutic relationship, and Positive coping skills or problem solving skills  Continued Clinical  Symptoms:  More than one psychiatric diagnosis Previous Psychiatric Diagnoses and Treatments Medical Diagnoses and Treatments/Surgeries  Cognitive Features That Contribute To Risk:  None    Suicide Risk:  Minimal: No identifiable suicidal ideation. Patient denies any current suicidal ideation, intent, or plan and homicidal ideation at the time of discharge. She is future oriented. No overt psychotic symptoms. No manic features. There is no evidence that she is a risk to herself or others at this time. She has been referred for outpatient therapy and medication management services. She is future oriented and motivated toward recovery. No withdrawal symptoms or cravings reported. She has a follow-up admission scheduled at [facility] for SUD treatment program on November?12 at 9:00?AM.      Follow-up Information     [facility] Follow up.   Why: You may call this provider if you wish to schedule a hospital follow up appointment for therapy and medication management services.  The appointments are Virtual. Contact information: [address]  [address] [place] [address] [phone]         [facility] Follow up.   Specialty: Addiction Medicine Why: Referral made Contact information: [address] [place] [address] [phone]         Services, [facility]. Go on 10/28/2024.   Why: Please go to this provider for residential treatment on 10/28/24 at 9:00 am. Contact information: [address] [place] [address] [phone]                 Plan Of Care/Follow-up recommendations:  See discharge summary.  Blair Chiquita Hint, NP 10/21/2024, 12:31 PM

## 2024-10-21 NOTE — Plan of Care (Signed)
   Problem: Education: Goal: Knowledge of [place] [facility] Education information/materials will improve Outcome: Progressing Goal: Emotional status will improve Outcome: Progressing Goal: Mental status will improve Outcome: Progressing Goal: Verbalization of understanding the information provided will improve Outcome: Progressing

## 2024-10-21 NOTE — BHH Group Notes (Signed)
Patient attended the Pharmacy group. 

## 2024-10-21 NOTE — Discharge Summary (Addendum)
 Physician Discharge Summary Note  Patient:  Katie Doyle is an 58 y.o., female MRN:  [id number] DOB:  11-12-1966 Patient phone:  [phone] (home)  Patient address:   [address] [place] [address],  Total Time spent with patient: 30 minutes  Date of Admission:  10/13/2024 Date of Discharge: 10/21/24   Reason for Admission:  The patient is a 58 year old female with a history of cocaine abuse and no prior psychiatric admissions. She presented to the behavioral health urgent care accompanied by her daughter requesting substance use treatment.     Principal Problem: Major depressive disorder, recurrent episode, moderate (HCC) Discharge Diagnoses: Principal Problem:   Major depressive disorder, recurrent episode, moderate (HCC) Active Problems:   HIV disease (HCC)   Cocaine use disorder, severe, dependence (HCC)   Past Psychiatric History: See H&P   Past Medical History:  Past Medical History:  Diagnosis Date   Allergy    Anemia    Anxiety    Asthma    rarely uses inhaler   Chronic pain in left shoulder 2013   post shoulder reduction   CVA (cerebral infarction)    ? pt unsure of date   Depression    Fibroids    HIV (human immunodeficiency virus infection) (HCC) 09-2014   Hypertension    Schizophrenia (HCC)    SVD (spontaneous vaginal delivery) 1984   x 1    Past Surgical History:  Procedure Laterality Date   ABDOMINAL HYSTERECTOMY N/A 05/14/2013   Procedure: TOTAL ABDOMINAL HYSTERECTOMY;  Surgeon: Lynwood KANDICE Solomons, MD;  Location: [facility];  Service: Gynecology;  Laterality: N/A;   APPENDECTOMY     CESAREAN SECTION  1992   x 1   CYSTO N/A 05/14/2013   Procedure: CYSTO;  Surgeon: Lynwood KANDICE Solomons, MD;  Location: [facility];  Service: Gynecology;  Laterality: N/A;   INCISION AND DRAINAGE ABSCESS N/A 05/23/2013   Procedure: INCISION AND DRAINAGE OF ABDOMINAL ABSCESS;  Surgeon: Lynwood KANDICE Solomons, MD;  Location: [facility];  Service: Gynecology;  Laterality: N/A;    SALPINGOOPHORECTOMY Bilateral 05/14/2013   Procedure: SALPINGO OOPHORECTOMY;  Surgeon: Lynwood KANDICE Solomons, MD;  Location: [facility];  Service: Gynecology;  Laterality: Bilateral;   WISDOM TOOTH EXTRACTION     Family History:  Family History  Problem Relation Age of Onset   Diabetes Mother    Hypertension Mother    Cancer Father        colon ~89 yo   Diabetes Brother    HIV Brother    Family Psychiatric  History: None pertinent  Social History:  Social History   Substance and Sexual Activity  Alcohol Use Yes   Alcohol/week: 1.0 standard drink of alcohol   Types: 1 Glasses of wine per week   Comment: occasionally     Social History   Substance and Sexual Activity  Drug Use Yes   Types: Marijuana, Crack cocaine    Social History   Socioeconomic History   Marital status: Divorced    Spouse name: Not on file   Number of children: Not on file   Years of education: Not on file   Highest education level: Not on file  Occupational History   Not on file  Tobacco Use   Smoking status: Every Day    Current packs/day: 0.00    Average packs/day: 0.3 packs/day for 20.0 years (5.0 ttl pk-yrs)    Types: Cigarettes    Start date: 09/10/1996    Last attempt to quit: 09/10/2016  Years since quitting: 8.1   Smokeless tobacco: Never  Vaping Use   Vaping status: Never Used  Substance and Sexual Activity   Alcohol use: Yes    Alcohol/week: 1.0 standard drink of alcohol    Types: 1 Glasses of wine per week    Comment: occasionally   Drug use: Yes    Types: Marijuana, Crack cocaine   Sexual activity: Yes    Partners: Male    Birth control/protection: None, Condom  Other Topics Concern   Not on file  Social History Narrative   Not on file   Social Drivers of Health   Financial Resource Strain: Not on file  Food Insecurity: Food Insecurity Present (10/13/2024)   Hunger Vital Sign    Worried About Running Out of Food in the Last Year: Sometimes true    Ran Out of Food in the  Last Year: Sometimes true  Transportation Needs: Unmet Transportation Needs (10/13/2024)   PRAPARE - Administrator, Civil Service (Medical): Yes    Lack of Transportation (Non-Medical): Yes  Physical Activity: Not on file  Stress: Not on file  Social Connections: Not on file    Hospital Course:  During the patient's hospitalization, patient had extensive initial psychiatric evaluation, and follow-up psychiatric evaluations every day.  Psychiatric diagnoses provided upon initial assessment:  Principal Problem:   Major depressive disorder, recurrent episode, moderate (HCC) Active Problems:   HIV disease (HCC)   Cocaine use disorder, severe, dependence (HCC)  Patient's psychiatric medications were adjusted on admission:  Started Zoloft 25 mg daily, increased to 50 mg after 2 days   During the hospitalization, other adjustments were made to the patient's psychiatric medication regimen: Increased Zoloft to 75 mg daily   Patient's care was discussed during the interdisciplinary team meeting every day during the hospitalization.  The patient denies having side effects to prescribed psychiatric medication.  Gradually, patient started adjusting to milieu. The patient was evaluated each day by a clinical provider to ascertain response to treatment. Improvement was noted by the patient's report of decreasing symptoms, improved sleep and appetite, affect, medication tolerance, behavior, and participation in unit programming.  Patient was asked each day to complete a self inventory noting mood, mental status, pain, new symptoms, anxiety and concerns.    Symptoms were reported as significantly decreased or resolved completely by discharge.   On day of discharge, the patient reports that their mood is stable. The patient denied having suicidal thoughts for more than 48 hours prior to discharge.  Patient denies having homicidal thoughts.  Patient denies having auditory hallucinations.   Patient denies any visual hallucinations or other symptoms of psychosis. The patient was motivated to continue taking medication with a goal of continued improvement in mental health.   The patient reports their target psychiatric symptoms of depression and anxiety responded well to the psychiatric medications, and the patient reports overall benefit other psychiatric hospitalization. Supportive psychotherapy was provided to the patient. The patient also participated in regular group therapy while hospitalized. Coping skills, problem solving as well as relaxation therapies were also part of the unit programming.  Labs were reviewed with the patient, and abnormal results were discussed with the patient.  The patient is able to verbalize their individual safety plan to this provider.  # It is recommended to the patient to continue psychiatric medications as prescribed, after discharge from the hospital.    # It is recommended to the patient to follow up with your outpatient psychiatric provider  and PCP.  # It was discussed with the patient, the impact of alcohol, drugs, tobacco have been there overall psychiatric and medical wellbeing, and total abstinence from substance use was recommended the patient.ed.  # Prescriptions provided or sent directly to preferred pharmacy at discharge. Patient agreeable to plan. Given opportunity to ask questions. Appears to feel comfortable with discharge.    # In the event of worsening symptoms, the patient is instructed to call the crisis hotline, 911 and or go to the nearest ED for appropriate evaluation and treatment of symptoms. To follow-up with primary care provider for other medical issues, concerns and or health care needs  # Patient was discharged home in the care of her daughter and has a follow-up admission scheduled at [facility] for SUD treatment program on November?12 at 9:00?AM.    Physical Findings: AIMS:  , ,  , N/A  ,  ,  ,   CIWA:   N/A COWS:    N/A  Musculoskeletal: Strength & Muscle Tone: within normal limits Gait & Station: normal Patient leans: N/A   Psychiatric Specialty Exam:  Presentation  General Appearance:  Casual; Fairly Groomed  Eye Contact: Good  Speech: Clear and Coherent; Normal Rate  Speech Volume: Normal  Handedness:No data recorded  Mood and Affect  Mood: Euthymic  Affect: Appropriate; Full Range   Thought Process  Thought Processes: Coherent; Linear  Descriptions of Associations:Intact  Orientation:Full (Time, Place and Person)  Thought Content:Logical  History of Schizophrenia/Schizoaffective disorder:No data recorded Duration of Psychotic Symptoms:No data recorded Hallucinations:Hallucinations: None  Ideas of Reference:None  Suicidal Thoughts:Suicidal Thoughts: No  Homicidal Thoughts:Homicidal Thoughts: No   Sensorium  Memory: Immediate Good; Recent Good; Remote Good  Judgment: Intact  Insight: Present   Executive Functions  Concentration: Good  Attention Span: Good  Recall: Good  Fund of Knowledge: Good  Language: Good   Psychomotor Activity  Psychomotor Activity:Psychomotor Activity: Normal   Assets  Assets: Communication Skills; Desire for Improvement; Housing; Resilience; Social Support   Sleep  Sleep:Sleep: Good  Estimated Sleeping Duration (Last 24 Hours): 7.25-9.25 hours (Due to Daylight Saving Time, the durations displayed may not accurately represent documentation during the time change interval)   Physical Exam: Physical Exam Vitals and nursing note reviewed.    ROS Blood pressure (!) 146/97, pulse 63, temperature 98.1 F (36.7 C), temperature source Oral, resp. rate 20, height 5' 5 (1.651 m), weight 85.3 kg, last menstrual period 01/19/2013, SpO2 99%. Body mass index is 31.28 kg/m.   Social History   Tobacco Use  Smoking Status Every Day   Current packs/day: 0.00   Average packs/day: 0.3 packs/day for 20.0 years  (5.0 ttl pk-yrs)   Types: Cigarettes   Start date: 09/10/1996   Last attempt to quit: 09/10/2016   Years since quitting: 8.1  Smokeless Tobacco Never   Tobacco Cessation:  A prescription for an FDA-approved tobacco cessation medication provided at discharge   Blood Alcohol level:  Lab Results  Component Value Date   [facility] <15 10/13/2024   ETH <10 11/22/2018    Metabolic Disorder Labs:  Lab Results  Component Value Date   HGBA1C 5.6 10/13/2024   MPG 114.02 10/13/2024   No results found for: PROLACTIN Lab Results  Component Value Date   CHOL 217 (H) 10/13/2024   TRIG 50 10/13/2024   HDL 75 10/13/2024   CHOLHDL 2.9 10/13/2024   VLDL 10 10/13/2024   LDLCALC 132 (H) 10/13/2024   LDLCALC 116 (H) 09/14/2024    See  Psychiatric Specialty Exam and Suicide Risk Assessment completed by Attending Physician prior to discharge.  Discharge destination:  Home  Is patient on multiple antipsychotic therapies at discharge:  No   Has Patient had three or more failed trials of antipsychotic monotherapy by history:  No  Recommended Plan for Multiple Antipsychotic Therapies: NA  Discharge Instructions     Activity as tolerated - No restrictions   Complete by: As directed    Diet - low sodium heart healthy   Complete by: As directed       Allergies as of 10/21/2024       Reactions   Penicillins Itching, Rash, Other (See Comments)   Pt tolerated  Zosyn  during 05/22/13 admission, Has patient had a PCN reaction causing immediate rash, facial/tongue/throat swelling, SOB or lightheadedness with hypotension: Yes Has patient had a PCN reaction causing severe rash involving mucus membranes or skin necrosis: No Has patient had a PCN reaction that required hospitalization Yes Has patient had a PCN reaction occurring within the last 10 years: No If all of the above answers are NO, then may proceed with Cephalosporin use.        Medication List     STOP taking these medications     albuterol  108 (90 Base) MCG/ACT inhaler Commonly known as: VENTOLIN  HFA       TAKE these medications      Indication  amLODipine 5 MG tablet Commonly known as: NORVASC Take 1 tablet (5 mg total) by mouth daily. Start taking on: October 22, 2024  Indication: High Blood Pressure   Biktarvy  50-200-25 MG Tabs tablet Generic drug: bictegravir-emtricitabine-tenofovir AF Take 1 tablet by mouth daily. Start taking on: October 22, 2024  Indication: HIV Disease   doxycycline 100 MG tablet Commonly known as: VIBRA-TABS Take 1 tablet (100 mg total) by mouth 2 (two) times daily for 40 doses.  Indication: Syphilis   hydrOXYzine  25 MG tablet Commonly known as: ATARAX  Take 1 tablet (25 mg total) by mouth 3 (three) times daily as needed for anxiety or itching.  Indication: Feeling Anxious   lisinopril  40 MG tablet Commonly known as: ZESTRIL  Take 1 tablet (40 mg total) by mouth daily. Start taking on: October 22, 2024 What changed:  medication strength how much to take  Indication: High Blood Pressure   nicotine polacrilex 2 MG gum Commonly known as: NICORETTE Take 1 each (2 mg total) by mouth as needed for smoking cessation.  Indication: Nicotine Addiction   nystatin cream Commonly known as: MYCOSTATIN Apply topically 2 (two) times daily.  Indication: Candidiasis   sertraline 50 MG tablet Commonly known as: Zoloft Take 1.5 tablets (75 mg total) by mouth daily.  Indication: Generalized Anxiety Disorder, Major Depressive Disorder   traZODone  50 MG tablet Commonly known as: DESYREL  Take 1 tablet (50 mg total) by mouth at bedtime as needed for sleep.  Indication: Trouble Sleeping        Follow-up Information     [facility] Follow up.   Why: You may call this provider if you wish to schedule a hospital follow up appointment for therapy and medication management services.  The appointments are Virtual. Contact information: [address]  [address] [place]  [address] [phone]         [facility] Follow up.   Specialty: Addiction Medicine Why: Referral made Contact information: [address] [place] [address] [phone]         Services, [facility]. Go on 10/28/2024.  Why: Please go to this provider for residential treatment on 10/28/24 at 9:00 am. Contact information: [address] [place] [address] [phone]                 Follow-up recommendations:   Activity: as tolerated  Diet: heart healthy  Other: -Follow-up with your outpatient psychiatric provider -instructions on appointment date, time, and address (location) are provided to you in discharge paperwork.  -Take your psychiatric medications as prescribed at discharge - instructions are provided to you in the discharge paperwork  -Follow-up with outpatient primary care doctor and other specialists -for management of preventative medicine and chronic medical disease: HIV, Hypertension, Syphilis   -Testing: Follow-up with outpatient provider for abnormal lab results: Mildly elevated Cholesterol (217) and LDL Cholesterol levels (132).   -If you are prescribed an atypical antipsychotic medication, we recommend that your outpatient psychiatrist follow routine screening for side effects within 3 months of discharge, including monitoring: AIMS scale, height, weight, blood pressure, fasting lipid panel, HbA1c, and fasting blood sugar.   -Recommend total abstinence from alcohol, tobacco, and other illicit drug use at discharge.   -If your psychiatric symptoms recur, worsen, or if you have side effects to your psychiatric medications, call your outpatient psychiatric provider, 911, [phone] or go to the nearest emergency department.  -If suicidal thoughts occur, immediately call your outpatient psychiatric provider, 911, [phone] or go to the nearest emergency department.    Comments: Follow all discharge  instructions as recommended.  Signed: Blair Chiquita Hint, NP 10/21/2024, 12:15 PM

## 2024-10-21 NOTE — Progress Notes (Signed)
 Client discharged from [facility] on 10/21/2024. Patient denies SI, plan, and intention. Suicide safety plan completed, reviewed with and given to the patient, and a copy in the chart. Client denies HI and AVH upon discharge. Client rates their depression 1/10 and anxiety 0/10. Patient is alert, oriented, and cooperative. RN provided patient with their discharge paperwork and reviewed information with client. Patient expressed that they understood all of the discharge instructions. Client was satisfied with their belongings returned to them from the locker and at bedside. Discharged patient to daughter at [facility] lobby.

## 2024-10-21 NOTE — [facility] Group Notes (Signed)
 Patient attended the RN group.

## 2024-11-16 ENCOUNTER — Ambulatory Visit: Payer: MEDICAID | Admitting: Physician Assistant

## 2024-11-16 VITALS — BP 136/85 | HR 76

## 2024-11-16 DIAGNOSIS — I10 Essential (primary) hypertension: Secondary | ICD-10-CM

## 2024-11-16 DIAGNOSIS — R011 Cardiac murmur, unspecified: Secondary | ICD-10-CM

## 2024-11-16 DIAGNOSIS — F1420 Cocaine dependence, uncomplicated: Secondary | ICD-10-CM

## 2024-11-16 DIAGNOSIS — B20 Human immunodeficiency virus [HIV] disease: Secondary | ICD-10-CM

## 2024-11-16 MED ORDER — LISINOPRIL 40 MG PO TABS: 40.0000 mg | ORAL_TABLET | Freq: Every day | ORAL | 1 refills | Status: DC

## 2024-11-16 MED ORDER — AMLODIPINE BESYLATE 10 MG PO TABS: 10.0000 mg | ORAL_TABLET | Freq: Every day | ORAL | 1 refills | Status: DC

## 2024-11-16 NOTE — Progress Notes (Unsigned)
 New Patient Office Visit  Subjective    Patient ID: Katie Doyle, female    DOB: 10-13-1966  Age: 58 y.o. MRN: [id number]  CC:  Chief Complaint  Patient presents with   Hypertension   Medication Refill   Blurred Vision   Discussed the use of AI scribe software for clinical note transcription with the patient, who gave verbal consent to proceed.  History of Present Illness    History of Present Illness Katie Doyle is a 58 year old female with hypertension who presents with blurry vision and difficulty managing blood pressure.  She has had fluctuating blood pressure despite medication, with readings up to 159/98 mmHg and worse control over the past weekend. She takes lisinopril  40 mg daily and recently started amlodipine  5 mg daily.  She has episodes of blurry vision that occur with higher blood pressure. She has not had a recent eye exam. Recent hospital labs showed normal blood sugar, kidney, liver, and thyroid  function.  She recently completed a 28-day treatment course for syphilis, with the final dose last night.  She has a strong family history of cardiovascular disease, including maternal and sibling coronary blockages, and her brother required a stent. She has had multiple recent family deaths, including her mother, which has been emotionally difficult.  She is in a long-term recovery program in [place] since November 12 and has been clean for 60 days. She is working on increasing water intake to about 64 ounces daily. She denies shortness of breath.  Physical Exam GENERAL: Alert, cooperative, well developed, no acute distress. HEENT: Normocephalic, normal oropharynx, moist mucous membranes. CHEST: Clear to auscultation bilaterally, no wheezes, rhonchi, or crackles. CARDIOVASCULAR: Heart with a murmur. ABDOMEN: Soft, non-tender, non-distended, without organomegaly, normal bowel sounds. EXTREMITIES: No cyanosis or edema. NEUROLOGICAL: Cranial nerves grossly intact,  moves all extremities without gross motor or sensory deficit.  Results LABS Renal function: Normal Hepatic function: Normal Thyroid  function tests: Normal Complete blood count: No anemia  Assessment and Plan Essential hypertension Blood pressure remains elevated despite maximum dose of Lisinopril . Amlodipine  recently added. Blurry vision linked to hypertension. - Increased amlodipine  dosage, monitor for ankle swelling. - Follow-up in two weeks to assess blood pressure control and medication tolerance.  Heart murmur Detected murmur, likely due to plaque. No immediate symptoms but requires monitoring. Family history of heart disease noted. - Check cholesterol levels at next visit. - Refer to cardiology for echocardiogram and further evaluation.  Blurry vision Vision blurriness associated with elevated blood pressure. No recent eye exam. - Monitor vision improvement with blood pressure control. - Schedule eye examination if no improvement.  HIV infection On Biktarvy . No recent infectious disease specialist contact. - Ensure follow-up with infectious disease specialist.    12th of nov  ltc  HPI Katie Doyle presents to establish care ***  Outpatient Encounter Medications as of 11/16/2024  Medication Sig   amLODipine  (NORVASC ) 5 MG tablet Take 1 tablet (5 mg total) by mouth daily.   bictegravir-emtricitabine -tenofovir  AF (BIKTARVY ) 50-200-25 MG TABS tablet Take 1 tablet by mouth daily.   hydrOXYzine  (ATARAX ) 25 MG tablet Take 1 tablet (25 mg total) by mouth 3 (three) times daily as needed for anxiety or itching.   lisinopril  (ZESTRIL ) 40 MG tablet Take 1 tablet (40 mg total) by mouth daily.   nicotine  polacrilex (NICORETTE ) 2 MG gum Take 1 each (2 mg total) by mouth as needed for smoking cessation.   nystatin  cream (MYCOSTATIN ) Apply topically 2 (two) times daily.   sertraline  (  ZOLOFT ) 50 MG tablet Take 1.5 tablets (75 mg total) by mouth daily.   traZODone  (DESYREL ) 50 MG  tablet Take 1 tablet (50 mg total) by mouth at bedtime as needed for sleep.   No facility-administered encounter medications on file as of 11/16/2024.    Past Medical History:  Diagnosis Date   Allergy    Anemia    Anxiety    Asthma    rarely uses inhaler   Chronic pain in left shoulder 2013   post shoulder reduction   CVA (cerebral infarction)    ? pt unsure of date   Depression    Fibroids    HIV (human immunodeficiency virus infection) (HCC) 09-2014   Hypertension    Schizophrenia (HCC)    SVD (spontaneous vaginal delivery) 1984   x 1    Past Surgical History:  Procedure Laterality Date   ABDOMINAL HYSTERECTOMY N/A 05/14/2013   Procedure: TOTAL ABDOMINAL HYSTERECTOMY;  Surgeon: Lynwood KANDICE Solomons, MD;  Location: [facility];  Service: Gynecology;  Laterality: N/A;   APPENDECTOMY     CESAREAN SECTION  1992   x 1   CYSTO N/A 05/14/2013   Procedure: CYSTO;  Surgeon: Lynwood KANDICE Solomons, MD;  Location: [facility];  Service: Gynecology;  Laterality: N/A;   INCISION AND DRAINAGE ABSCESS N/A 05/23/2013   Procedure: INCISION AND DRAINAGE OF ABDOMINAL ABSCESS;  Surgeon: Lynwood KANDICE Solomons, MD;  Location: [facility];  Service: Gynecology;  Laterality: N/A;   SALPINGOOPHORECTOMY Bilateral 05/14/2013   Procedure: SALPINGO OOPHORECTOMY;  Surgeon: Lynwood KANDICE Solomons, MD;  Location: [facility];  Service: Gynecology;  Laterality: Bilateral;   WISDOM TOOTH EXTRACTION      Family History  Problem Relation Age of Onset   Diabetes Mother    Hypertension Mother    Cancer Father        colon ~31 yo   Diabetes Brother    HIV Brother     Social History   Socioeconomic History   Marital status: Divorced    Spouse name: Not on file   Number of children: Not on file   Years of education: Not on file   Highest education level: Not on file  Occupational History   Not on file  Tobacco Use   Smoking status: Every Day    Current packs/day: 0.00    Average packs/day: 0.3 packs/day for 20.0 years (5.0 ttl pk-yrs)     Types: Cigarettes    Start date: 09/10/1996    Last attempt to quit: 09/10/2016    Years since quitting: 8.1   Smokeless tobacco: Never  Vaping Use   Vaping status: Never Used  Substance and Sexual Activity   Alcohol use: Yes    Alcohol/week: 1.0 standard drink of alcohol    Types: 1 Glasses of wine per week    Comment: occasionally   Drug use: Yes    Types: Marijuana, Crack cocaine   Sexual activity: Yes    Partners: Male    Birth control/protection: None, Condom  Other Topics Concern   Not on file  Social History Narrative   Not on file   Social Drivers of Health   Financial Resource Strain: Not on file  Food Insecurity: Food Insecurity Present (10/13/2024)   Hunger Vital Sign    Worried About Running Out of Food in the Last Year: Sometimes true    Ran Out of Food in the Last Year: Sometimes true  Transportation Needs: Unmet Transportation Needs (10/13/2024)   PRAPARE - Transportation  Lack of Transportation (Medical): Yes    Lack of Transportation (Non-Medical): Yes  Physical Activity: Not on file  Stress: Not on file  Social Connections: Not on file  Intimate Partner Violence: Not At Risk (10/13/2024)   Humiliation, Afraid, Rape, and Kick questionnaire    Fear of Current or Ex-Partner: No    Emotionally Abused: No    Physically Abused: No    Sexually Abused: No    ROS      Objective    LMP 01/19/2013   Physical Exam  {Labs (Optional):23779}    Assessment & Plan:   Problem List Items Addressed This Visit   None     No AVS created, patient does not have access to MyChart at this time, no printer capability on screening van.  Patient education given through teach back method.  No follow-ups on file.   Kirk RAMAN Mayers, PA-C

## 2024-11-17 ENCOUNTER — Encounter: Payer: Self-pay | Admitting: Physician Assistant

## 2024-11-24 ENCOUNTER — Other Ambulatory Visit: Payer: Self-pay

## 2024-11-24 MED ORDER — BIKTARVY 50-200-25 MG PO TABS: 1.0000 | ORAL_TABLET | Freq: Every day | ORAL | 2 refills | Status: DC

## 2024-11-24 NOTE — Telephone Encounter (Signed)
 Patient currently admitted at [facility].

## 2024-11-30 ENCOUNTER — Encounter: Payer: Self-pay | Admitting: Physician Assistant

## 2024-11-30 ENCOUNTER — Ambulatory Visit: Payer: MEDICAID | Admitting: Physician Assistant

## 2024-11-30 VITALS — BP 149/90 | HR 68 | Ht 65.0 in | Wt 228.0 lb

## 2024-11-30 DIAGNOSIS — R011 Cardiac murmur, unspecified: Secondary | ICD-10-CM

## 2024-11-30 DIAGNOSIS — F1420 Cocaine dependence, uncomplicated: Secondary | ICD-10-CM

## 2024-11-30 DIAGNOSIS — B20 Human immunodeficiency virus [HIV] disease: Secondary | ICD-10-CM

## 2024-11-30 DIAGNOSIS — K047 Periapical abscess without sinus: Secondary | ICD-10-CM

## 2024-11-30 DIAGNOSIS — I10 Essential (primary) hypertension: Secondary | ICD-10-CM

## 2024-11-30 DIAGNOSIS — Z13220 Encounter for screening for lipoid disorders: Secondary | ICD-10-CM

## 2024-11-30 DIAGNOSIS — K0889 Other specified disorders of teeth and supporting structures: Secondary | ICD-10-CM

## 2024-11-30 MED ORDER — NAPROXEN 500 MG PO TABS: 500.0000 mg | ORAL_TABLET | Freq: Two times a day (BID) | ORAL | 0 refills | Status: AC

## 2024-11-30 MED ORDER — HYDROCHLOROTHIAZIDE 12.5 MG PO TABS: 12.5000 mg | ORAL_TABLET | Freq: Every day | ORAL | 1 refills | Status: DC

## 2024-11-30 NOTE — Progress Notes (Unsigned)
 Established Patient Office Visit  Subjective   Patient ID: Katie Doyle, female    DOB: [birth year]  Age: 58 y.o. MRN: [id number]  Chief Complaint  Patient presents with   Hypertension    2 WEEK FOLLOW UP WITH FASTING LABS  Discussed the use of AI scribe software for clinical note transcription with the patient, who gave verbal consent to proceed.  History of Present Illness   Katie Doyle is a 58 year old female with hypertension who presents with elevated blood pressure and dental pain.  She continues to be treated for substance abuse at [facility] recovery center  She had a recent BP of 178/100 mmHg that led to an ED visit at [facility]. She links this to severe dental pain from oral abscesses. She is taking clindamycin  three capsules every eight hours due to a penicillin allergy. She has run out of naproxen  that was prescribed for pain. She currently has mild dental pain.  She has hypertension on amlodipine  and lisinopril , with recent home or clinic readings of 143/80 mmHg, 156/92 mmHg, 139/92 mmHg, and 149/90 mmHg despite this regimen. She has no ankle swelling and no problems with the higher dose of amlodipine .   She has not eaten today and is fasting for labs to check cholesterol and vitamin D  levels.    Past Medical History:  Diagnosis Date   Allergy    Anemia    Anxiety    Asthma    rarely uses inhaler   Chronic pain in left shoulder 2013   post shoulder reduction   CVA (cerebral infarction)    ? pt unsure of date   Depression    Fibroids    HIV (human immunodeficiency virus infection) (HCC) 09-2014   Hypertension    Schizophrenia (HCC)    SVD (spontaneous vaginal delivery) 1984   x 1   Social History   Socioeconomic History   Marital status: Divorced    Spouse name: Not on file   Number of children: Not on file   Years of education: Not on file   Highest education level: Not on file  Occupational History   Not on file  Tobacco Use    Smoking status: Every Day    Current packs/day: 0.00    Average packs/day: 0.3 packs/day for 20.0 years (5.0 ttl pk-yrs)    Types: Cigarettes    Start date: 09/10/1996    Last attempt to quit: 09/10/2016    Years since quitting: 8.2   Smokeless tobacco: Never  Vaping Use   Vaping status: Never Used  Substance and Sexual Activity   Alcohol use: Yes    Alcohol/week: 1.0 standard drink of alcohol    Types: 1 Glasses of wine per week    Comment: occasionally   Drug use: Yes    Types: Marijuana, Crack cocaine   Sexual activity: Yes    Partners: Male    Birth control/protection: None, Condom  Other Topics Concern   Not on file  Social History Narrative   Not on file   Social Drivers of Health   Tobacco Use: High Risk (11/30/2024)   Patient History    Smoking Tobacco Use: Every Day    Smokeless Tobacco Use: Never    Passive Exposure: Not on file  Financial Resource Strain: Not on file  Food Insecurity: Food Insecurity Present (10/13/2024)   Epic    Worried About Programme Researcher, Broadcasting/film/video in the Last Year: Sometimes true    [employer] of  Food in the Last Year: Sometimes true  Transportation Needs: Unmet Transportation Needs (10/13/2024)   Epic    Lack of Transportation (Medical): Yes    Lack of Transportation (Non-Medical): Yes  Physical Activity: Not on file  Stress: Not on file  Social Connections: Not on file  Intimate Partner Violence: Not At Risk (10/13/2024)   Epic    Fear of Current or Ex-Partner: No    Emotionally Abused: No    Physically Abused: No    Sexually Abused: No  Depression (PHQ2-9): Low Risk (07/11/2022)   Depression (PHQ2-9)    PHQ-2 Score: 2  Alcohol Screen: Low Risk (10/13/2024)   Alcohol Screen    Last Alcohol Screening Score (AUDIT): 2  Housing: High Risk (10/13/2024)   Epic    Unable to Pay for Housing in the Last Year: No    Number of Times Moved in the Last Year: 3    Homeless in the Last Year: Patient declined  Utilities: Not At Risk (10/13/2024)    Epic    Threatened with loss of utilities: No  Health Literacy: Not on file   Family History  Problem Relation Age of Onset   Diabetes Mother    Hypertension Mother    Cancer Father        colon ~48 yo   Diabetes Brother    HIV Brother    Allergies[1]  Review of Systems  Constitutional: Negative.   HENT: Negative.    Eyes: Negative.   Respiratory:  Negative for shortness of breath.   Cardiovascular:  Negative for chest pain.  Gastrointestinal: Negative.   Genitourinary: Negative.   Musculoskeletal: Negative.   Skin: Negative.   Neurological: Negative.   Endo/Heme/Allergies: Negative.   Psychiatric/Behavioral: Negative.        Objective:     BP (!) 149/90 (BP Location: Left Arm, Patient Position: Sitting, Cuff Size: Large)   Pulse 68   Ht 5' 5 (1.651 m)   Wt 228 lb (103.4 kg)   LMP 01/19/2013   SpO2 98%   BMI 37.94 kg/m  BP Readings from Last 3 Encounters:  11/30/24 (!) 149/90  11/16/24 136/85  07/27/24 (!) 167/114   Wt Readings from Last 3 Encounters:  11/30/24 228 lb (103.4 kg)  11/20/23 198 lb (89.8 kg)  07/11/22 212 lb (96.2 kg)    Physical Exam Vitals and nursing note reviewed.    GENERAL: Alert, cooperative, well developed, no acute distress. HEENT: Normocephalic, normal oropharynx, moist mucous membranes. CHEST: Clear to auscultation bilaterally, no wheezes, rhonchi, or crackles. CARDIOVASCULAR: Heart murmur present. ABDOMEN: Soft, non-tender, non-distended, without organomegaly, normal bowel sounds. EXTREMITIES: No cyanosis or edema. NEUROLOGICAL: Cranial nerves grossly intact, moves all extremities without gross motor or sensory deficit.   Assessment & Plan:   Problem List Items Addressed This Visit   None 1. Essential hypertension (Primary) Trial hydrochlorothiazide .  Patient encouraged to continue checking blood pressure on a daily basis, keeping a written log and having available for all office visits.  Red flags given for prompt  reevaluation - hydrochlorothiazide  (HYDRODIURIL ) 12.5 MG tablet; Take 1 tablet (12.5 mg total) by mouth daily.  Dispense: 30 tablet; Refill: 1 - Vitamin D , 25-hydroxy  2. Pain, dental Continue regimen as prescribed by emergency department visit, refill naproxen .  Patient strongly encouraged to follow-up with dentistry as soon as able - naproxen  (NAPROSYN ) 500 MG tablet; Take 1 tablet (500 mg total) by mouth 2 (two) times daily with a meal.  Dispense: 60 tablet; Refill: 0  3. Cocaine use disorder, severe, dependence (HCC) Currently in substance abuse treatment program  4. HIV disease (HCC) Continue follow-up with infectious disease  5. Screening, lipid Fasting labs completed today - Lipid panel    I have reviewed the patient's medical history (PMH, PSH, Social History, Family History, Medications, and allergies) , and have been updated if relevant. I spent 30 minutes reviewing chart and  face to face time with patient.    No follow-ups on file.    Gayle Martinez S Mayers, PA-C     [1]  Allergies Allergen Reactions   Penicillins Itching, Rash and Other (See Comments)    Pt tolerated  Zosyn  during 05/22/13 admission, Has patient had a PCN reaction causing immediate rash, facial/tongue/throat swelling, SOB or lightheadedness with hypotension: Yes Has patient had a PCN reaction causing severe rash involving mucus membranes or skin necrosis: No Has patient had a PCN reaction that required hospitalization Yes Has patient had a PCN reaction occurring within the last 10 years: No If all of the above answers are NO, then may proceed with Cephalosporin use.

## 2024-11-30 NOTE — Patient Instructions (Signed)
 VISIT SUMMARY:  Today, we addressed your elevated blood pressure and dental pain. You have been experiencing severe dental pain due to oral abscesses, which has also affected your blood pressure. We discussed your current medications and made some adjustments to better manage your conditions.  YOUR PLAN:  -DENTAL INFECTION WITH PERIAPICAL ABSCESS: A dental infection with a periapical abscess is a collection of pus at the root of a tooth, causing pain and potentially affecting other health conditions. You should continue taking clindamycin  as prescribed and follow up with a dentist for further evaluation and treatment. Naproxen  has been prescribed to help manage your pain.  -ESSENTIAL HYPERTENSION: Essential hypertension is high blood pressure with no identifiable cause. Your blood pressure remains elevated despite your current medications. We have added hydrochlorothiazide  to your regimen and you should continue taking lisinopril  and amlodipine . Please monitor your blood pressure daily.

## 2024-12-01 ENCOUNTER — Encounter: Payer: Self-pay | Admitting: Physician Assistant

## 2024-12-01 ENCOUNTER — Ambulatory Visit: Payer: Self-pay | Admitting: Physician Assistant

## 2024-12-01 DIAGNOSIS — E782 Mixed hyperlipidemia: Secondary | ICD-10-CM

## 2024-12-01 DIAGNOSIS — E559 Vitamin D deficiency, unspecified: Secondary | ICD-10-CM

## 2024-12-01 LAB — LIPID PANEL
Chol/HDL Ratio: 2.1 ratio (ref 0.0–4.4)
Cholesterol, Total: 248 mg/dL — ABNORMAL HIGH (ref 100–199)
HDL: 120 mg/dL (ref >39)
LDL Chol Calc (NIH): 122 mg/dL — ABNORMAL HIGH (ref 0–99)
Triglycerides: 39 mg/dL (ref 0–149)
VLDL Cholesterol Cal: 6 mg/dL (ref 5–40)

## 2024-12-01 LAB — VITAMIN D 25 HYDROXY (VIT D DEFICIENCY, FRACTURES): Vit D, 25-Hydroxy: 18.1 ng/mL — ABNORMAL LOW (ref 30.0–100.0)

## 2024-12-01 MED ORDER — VITAMIN D (ERGOCALCIFEROL) 1.25 MG (50000 UNIT) PO CAPS: 50000.0000 [IU] | ORAL_CAPSULE | ORAL | 2 refills | Status: AC

## 2024-12-01 MED ORDER — ATORVASTATIN CALCIUM 10 MG PO TABS: 10.0000 mg | ORAL_TABLET | Freq: Every day | ORAL | 2 refills | Status: AC

## 2024-12-01 NOTE — Progress Notes (Signed)
 Test results and provider recommendations given to [facility] aftercare coordinator

## 2024-12-14 ENCOUNTER — Encounter: Payer: Self-pay | Admitting: Physician Assistant

## 2024-12-14 ENCOUNTER — Ambulatory Visit: Payer: MEDICAID | Admitting: Physician Assistant

## 2024-12-14 VITALS — BP 129/73 | HR 80 | Resp 18 | Ht 65.0 in | Wt 242.0 lb

## 2024-12-14 DIAGNOSIS — L853 Xerosis cutis: Secondary | ICD-10-CM | POA: Diagnosis not present

## 2024-12-14 DIAGNOSIS — M25511 Pain in right shoulder: Secondary | ICD-10-CM | POA: Diagnosis not present

## 2024-12-14 DIAGNOSIS — F1420 Cocaine dependence, uncomplicated: Secondary | ICD-10-CM

## 2024-12-14 DIAGNOSIS — G8929 Other chronic pain: Secondary | ICD-10-CM

## 2024-12-14 DIAGNOSIS — I10 Essential (primary) hypertension: Secondary | ICD-10-CM

## 2024-12-14 MED ORDER — HYDROCHLOROTHIAZIDE 25 MG PO TABS: 25.0000 mg | ORAL_TABLET | Freq: Every day | ORAL | 1 refills | Status: DC

## 2024-12-14 MED ORDER — CETAPHIL MOISTURIZING EX LOTN: 1.0000 | TOPICAL_LOTION | CUTANEOUS | 0 refills | Status: AC | PRN

## 2024-12-14 MED ORDER — DICLOFENAC SODIUM 1 % EX GEL: 2.0000 g | Freq: Four times a day (QID) | CUTANEOUS | 1 refills | Status: AC | PRN

## 2024-12-14 MED ORDER — AMLODIPINE BESYLATE 5 MG PO TABS: 5.0000 mg | ORAL_TABLET | Freq: Every day | ORAL | 1 refills | Status: DC

## 2024-12-14 NOTE — Progress Notes (Unsigned)
" ° °  Established Patient Office Visit  Subjective   Patient ID: Trinetta Alemu, female    DOB: 1966/11/01  Age: 58 y.o. MRN: [id number]  Chief Complaint  Patient presents with   Hypertension    2 week follow up   Discussed the use of AI scribe software for clinical note transcription with the patient, who gave verbal consent to proceed.  History of Present Illness    HPI  {History (Optional):23778}  ROS    Objective:     LMP 01/19/2013  {Vitals History (Optional):23777}  Physical Exam   No results found for any visits on 12/14/24.  {Labs (Optional):23779}  The ASCVD Risk score (Arnett DK, et al., 2019) failed to calculate for the following reasons:   The valid HDL cholesterol range is 20 to 100 mg/dL    Assessment & Plan:   Problem List Items Addressed This Visit       Cardiovascular and Mediastinum   Essential hypertension   Relevant Medications   amLODipine  (NORVASC ) 5 MG tablet   hydrochlorothiazide  (HYDRODIURIL ) 25 MG tablet   Other Visit Diagnoses       Chronic right shoulder pain    -  Primary   Relevant Medications   diclofenac Sodium (VOLTAREN) 1 % GEL   Other Relevant Orders   Ambulatory referral to Orthopedic Surgery     Dry skin       Relevant Medications   cetaphil (CETAPHIL) lotion       No follow-ups on file.    Kirk RAMAN Mayers, PA-C  "

## 2024-12-14 NOTE — Patient Instructions (Signed)
 VISIT SUMMARY:  During today's visit, we discussed the swelling in your ankles and feet, your dry skin, and your chronic right shoulder pain. We reviewed your current medications and made some adjustments to help manage your symptoms more effectively.  YOUR PLAN:  -CHRONIC RIGHT SHOULDER PAIN: You have ongoing pain and stiffness in your right shoulder due to a previous dislocation. We are referring you to an orthopedic specialist for further evaluation and management. In the meantime, you should perform shoulder stretching exercises, such as wall and doorway stretches, multiple times daily. Avoid sleeping on the affected side. We have prescribed Voltaren cream for topical pain relief and recommend continuing to use naproxen  for pain management.  -ESSENTIAL HYPERTENSION: Your blood pressure has been higher than usual, and you have experienced swelling in your ankles and feet, likely due to your current medication, amlodipine . We have reduced the dosage of amlodipine  and increased the dosage of another blood pressure medication to help manage your blood pressure and reduce the swelling.  -DRY SKIN: You have very dry and itchy skin, and your current lotion is not effective. We have prescribed Cetaphil moisturizing cream to help alleviate the dryness and itching.

## 2024-12-15 ENCOUNTER — Encounter: Payer: Self-pay | Admitting: Physician Assistant

## 2024-12-24 ENCOUNTER — Ambulatory Visit (INDEPENDENT_AMBULATORY_CARE_PROVIDER_SITE_OTHER): Payer: MEDICAID | Admitting: Physician Assistant

## 2024-12-24 ENCOUNTER — Other Ambulatory Visit (INDEPENDENT_AMBULATORY_CARE_PROVIDER_SITE_OTHER): Payer: MEDICAID

## 2024-12-24 DIAGNOSIS — G8929 Other chronic pain: Secondary | ICD-10-CM

## 2024-12-24 DIAGNOSIS — M25511 Pain in right shoulder: Secondary | ICD-10-CM | POA: Diagnosis not present

## 2024-12-24 MED ORDER — BUPIVACAINE HCL 0.25 % IJ SOLN
2.0000 mL | INTRAMUSCULAR | Status: AC | PRN
  Administered 2024-12-24: 2 mL via INTRA_ARTICULAR

## 2024-12-24 MED ORDER — LIDOCAINE HCL 2 % IJ SOLN
2.0000 mL | INTRAMUSCULAR | Status: AC | PRN
  Administered 2024-12-24: 2 mL

## 2024-12-24 MED ORDER — METHYLPREDNISOLONE ACETATE 40 MG/ML IJ SUSP
40.0000 mg | INTRAMUSCULAR | Status: AC | PRN
  Administered 2024-12-24: 40 mg via INTRA_ARTICULAR
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
## 2025-01-04 ENCOUNTER — Encounter: Payer: Self-pay | Admitting: Physician Assistant

## 2025-01-04 ENCOUNTER — Ambulatory Visit: Payer: MEDICAID | Admitting: Physician Assistant

## 2025-01-04 VITALS — BP 139/74 | HR 84 | Ht 65.0 in | Wt 253.0 lb

## 2025-01-04 DIAGNOSIS — K0889 Other specified disorders of teeth and supporting structures: Secondary | ICD-10-CM

## 2025-01-04 DIAGNOSIS — I10 Essential (primary) hypertension: Secondary | ICD-10-CM | POA: Diagnosis not present

## 2025-01-04 MED ORDER — LISINOPRIL 40 MG PO TABS: 40.0000 mg | ORAL_TABLET | Freq: Every day | ORAL | 1 refills | Status: AC

## 2025-01-04 MED ORDER — HYDROCHLOROTHIAZIDE 50 MG PO TABS: 50.0000 mg | ORAL_TABLET | Freq: Every day | ORAL | 1 refills | Status: AC

## 2025-01-04 MED ORDER — AZITHROMYCIN 250 MG PO TABS: ORAL_TABLET | ORAL | 0 refills | Status: AC

## 2025-01-04 NOTE — Progress Notes (Unsigned)
" ° °  Established Patient Office Visit  Subjective   Patient ID: Katie Doyle, female    DOB: 1966-05-30  Age: 59 y.o. MRN: [id number]  Chief Complaint  Patient presents with   Hypertension    Follow up on chronic health conditions and medication management  Discussed the use of AI scribe software for clinical note transcription with the patient, who gave verbal consent to proceed.  History of Present Illness  History of Present Illness Katie Doyle is a 59 year old female with hypertension who presents with tooth swelling and blood pressure management.  She has had swelling of a right upper tooth for about 1 week, describing a knot in the area. About a month ago she was treated with doxycycline  from the hospital for a similar dental issue due to her penicillin allergy. She is waiting for a dental appointment in March and is worried about poor overall dentition.  She notes she did not have swelling before starting amlodipine . Her blood pressure has improved after lowering amlodipine  and increasing her other medications, with recent home readings of 133/77, 143/89, 133/84, 137/81, and 139/74. She currently takes amlodipine  5 mg, lisinopril  40 mg, and hydrochlorothiazide  25 mg daily. She mentions ankle swelling but no other concerns apart from the tooth swelling.  Physical Exam VITALS: BP- 139/74 GENERAL: Alert, cooperative, well developed, no acute distress HEENT: Normocephalic, normal oropharynx, moist mucous membranes CHEST: Clear to auscultation bilaterally, no wheezes, rhonchi, or crackles CARDIOVASCULAR: Normal heart rate and rhythm, S1 and S2 normal without murmurs ABDOMEN: Soft, non-tender, non-distended, without organomegaly, normal bowel sounds EXTREMITIES: No cyanosis or edema NEUROLOGICAL: Cranial nerves grossly intact, moves all extremities without gross motor or sensory deficit  Results Labs Thyroid  studies (09/2024): Within normal limits  Assessment and Plan Dental  abscess with pain and swelling Previous doxycycline  ineffective. Allergic to penicillin. - Prescribed azithromycin  for infection. - Referred to Dentistry for evaluation and treatment.  Essential hypertension Blood pressure slightly elevated but improved with current regimen. Amlodipine  may contribute to swelling. - Discontinued amlodipine . - Increased hydrochlorothiazide  to 50 mg. - Continue lisinopril  at current dose. - Scheduled follow-up in two weeks to check labs and reassess blood pressure.   HPI  {History (Optional):23778}  ROS    Objective:     LMP 01/19/2013  {Vitals History (Optional):23777}  Physical Exam   No results found for any visits on 01/04/25.  {Labs (Optional):23779}  The ASCVD Risk score (Arnett DK, et al., 2019) failed to calculate for the following reasons:   The valid HDL cholesterol range is 20 to 100 mg/dL    Assessment & Plan:   Problem List Items Addressed This Visit   None   No follow-ups on file.    Kirk RAMAN Mayers, PA-C  "

## 2025-01-04 NOTE — Patient Instructions (Signed)
 VISIT SUMMARY:  Today, you were seen for tooth swelling and blood pressure management. You have had swelling of a right upper tooth for about a week, and you are waiting for a dental appointment in March. Your blood pressure has improved after adjusting your medications, but you are experiencing some ankle swelling.  YOUR PLAN:  -DENTAL ABSCESS WITH PAIN AND SWELLING: A dental abscess is an infection at the root of a tooth or between the gum and a tooth, causing pain and swelling. You have been prescribed azithromycin  to treat the infection.  -ESSENTIAL HYPERTENSION: Essential hypertension is high blood pressure with no identifiable cause. Your blood pressure has improved with your current medication regimen, but amlodipine  may be contributing to your ankle swelling. Therefore, amlodipine  has been discontinued, and your hydrochlorothiazide  dose has been increased to 50 mg. You should continue taking lisinopril  at the current dose. A follow-up appointment has been scheduled in two weeks to check your labs and reassess your blood pressure.

## 2025-01-05 ENCOUNTER — Encounter: Payer: Self-pay | Admitting: Physician Assistant

## 2025-01-20 ENCOUNTER — Other Ambulatory Visit: Payer: Self-pay

## 2025-01-20 MED ORDER — BIKTARVY 50-200-25 MG PO TABS: 1.0000 | ORAL_TABLET | Freq: Every day | ORAL | 0 refills | Status: AC

## 2025-02-04 ENCOUNTER — Ambulatory Visit: Payer: MEDICAID | Admitting: Family
